# Patient Record
Sex: Female | Born: 1945 | Race: White | Hispanic: No | Smoking: Former smoker
Health system: Southern US, Community
[De-identification: ages and names within clinical notes are randomized; demographics above are authoritative.]

## PROBLEM LIST (undated history)

## (undated) DIAGNOSIS — S82821A Torus fracture of lower end of right fibula, initial encounter for closed fracture: Secondary | ICD-10-CM

## (undated) DIAGNOSIS — K219 Gastro-esophageal reflux disease without esophagitis: Secondary | ICD-10-CM

## (undated) DIAGNOSIS — G4734 Idiopathic sleep related nonobstructive alveolar hypoventilation: Secondary | ICD-10-CM

## (undated) DIAGNOSIS — M069 Rheumatoid arthritis, unspecified: Secondary | ICD-10-CM

## (undated) DIAGNOSIS — S2220XA Unspecified fracture of sternum, initial encounter for closed fracture: Secondary | ICD-10-CM

## (undated) DIAGNOSIS — F329 Major depressive disorder, single episode, unspecified: Secondary | ICD-10-CM

## (undated) DIAGNOSIS — A0472 Enterocolitis due to Clostridium difficile, not specified as recurrent: Secondary | ICD-10-CM

## (undated) DIAGNOSIS — I10 Essential (primary) hypertension: Secondary | ICD-10-CM

## (undated) DIAGNOSIS — R053 Chronic cough: Secondary | ICD-10-CM

## (undated) DIAGNOSIS — R05 Cough: Secondary | ICD-10-CM

## (undated) DIAGNOSIS — C801 Malignant (primary) neoplasm, unspecified: Secondary | ICD-10-CM

## (undated) DIAGNOSIS — J189 Pneumonia, unspecified organism: Secondary | ICD-10-CM

## (undated) DIAGNOSIS — M1710 Unilateral primary osteoarthritis, unspecified knee: Secondary | ICD-10-CM

## (undated) DIAGNOSIS — J849 Interstitial pulmonary disease, unspecified: Secondary | ICD-10-CM

## (undated) DIAGNOSIS — J8410 Pulmonary fibrosis, unspecified: Secondary | ICD-10-CM

## (undated) DIAGNOSIS — K589 Irritable bowel syndrome without diarrhea: Secondary | ICD-10-CM

## (undated) DIAGNOSIS — E559 Vitamin D deficiency, unspecified: Secondary | ICD-10-CM

## (undated) DIAGNOSIS — R011 Cardiac murmur, unspecified: Secondary | ICD-10-CM

## (undated) DIAGNOSIS — I517 Cardiomegaly: Secondary | ICD-10-CM

## (undated) DIAGNOSIS — F32A Depression, unspecified: Secondary | ICD-10-CM

## (undated) DIAGNOSIS — M179 Osteoarthritis of knee, unspecified: Secondary | ICD-10-CM

## (undated) HISTORY — DX: Depression, unspecified: F32.A

## (undated) HISTORY — PX: BREAST CYST ASPIRATION: SHX578

## (undated) HISTORY — PX: ESOPHAGOGASTRODUODENOSCOPY: SHX1529

## (undated) HISTORY — PX: HERNIA REPAIR: SHX51

## (undated) HISTORY — PX: EYE SURGERY: SHX253

## (undated) HISTORY — PX: VAGINAL HYSTERECTOMY: SUR661

## (undated) HISTORY — DX: Major depressive disorder, single episode, unspecified: F32.9

## (undated) HISTORY — PX: APPENDECTOMY: SHX54

## (undated) HISTORY — PX: COLONOSCOPY: SHX174

## (undated) HISTORY — PX: CATARACT EXTRACTION W/ INTRAOCULAR LENS  IMPLANT, BILATERAL: SHX1307

---

## 2003-12-20 ENCOUNTER — Ambulatory Visit: Payer: Self-pay | Admitting: Internal Medicine

## 2004-12-22 ENCOUNTER — Ambulatory Visit: Payer: Self-pay | Admitting: Internal Medicine

## 2005-02-05 ENCOUNTER — Ambulatory Visit: Payer: Self-pay | Admitting: Gastroenterology

## 2005-12-24 ENCOUNTER — Ambulatory Visit: Payer: Self-pay | Admitting: Internal Medicine

## 2006-11-19 ENCOUNTER — Ambulatory Visit: Payer: Self-pay | Admitting: Internal Medicine

## 2007-01-11 ENCOUNTER — Ambulatory Visit: Payer: Self-pay | Admitting: Internal Medicine

## 2008-01-12 ENCOUNTER — Ambulatory Visit: Payer: Self-pay | Admitting: Internal Medicine

## 2009-02-12 ENCOUNTER — Ambulatory Visit: Payer: Self-pay | Admitting: Internal Medicine

## 2010-01-22 ENCOUNTER — Ambulatory Visit: Payer: Self-pay | Admitting: Unknown Physician Specialty

## 2010-03-05 ENCOUNTER — Ambulatory Visit: Payer: Self-pay | Admitting: Ophthalmology

## 2010-03-10 ENCOUNTER — Ambulatory Visit: Payer: Self-pay | Admitting: Internal Medicine

## 2010-03-31 ENCOUNTER — Ambulatory Visit: Payer: Self-pay | Admitting: Ophthalmology

## 2011-04-07 ENCOUNTER — Ambulatory Visit: Payer: Self-pay | Admitting: Internal Medicine

## 2011-04-10 ENCOUNTER — Observation Stay: Payer: Self-pay | Admitting: Surgery

## 2011-04-10 LAB — CBC WITH DIFFERENTIAL/PLATELET
Basophil #: 0 10*3/uL (ref 0.0–0.1)
Basophil %: 0.5 %
Eosinophil %: 4.7 %
HCT: 39.2 % (ref 35.0–47.0)
HGB: 12.9 g/dL (ref 12.0–16.0)
Lymphocyte %: 45.8 %
Monocyte #: 0.4 10*3/uL (ref 0.0–0.7)
Monocyte %: 6.1 %
Neutrophil #: 2.6 10*3/uL (ref 1.4–6.5)
Neutrophil %: 42.9 %
Platelet: 198 10*3/uL (ref 150–440)
RBC: 3.88 10*6/uL (ref 3.80–5.20)
RDW: 15.5 % — ABNORMAL HIGH (ref 11.5–14.5)

## 2011-04-10 LAB — COMPREHENSIVE METABOLIC PANEL
Albumin: 4.1 g/dL (ref 3.4–5.0)
Bilirubin,Total: 0.7 mg/dL (ref 0.2–1.0)
Chloride: 102 mmol/L (ref 98–107)
Co2: 29 mmol/L (ref 21–32)
EGFR (African American): >60
EGFR (Non-African Amer.): >60
Glucose: 88 mg/dL (ref 65–99)
Osmolality: 279 (ref 275–301)
Potassium: 4.3 mmol/L (ref 3.5–5.1)
SGOT(AST): 38 U/L — ABNORMAL HIGH (ref 15–37)
SGPT (ALT): 37 U/L

## 2011-04-10 LAB — TROPONIN I: Troponin-I: <0.02 ng/mL

## 2011-04-11 LAB — BASIC METABOLIC PANEL
Anion Gap: 8 (ref 7–16)
BUN: 10 mg/dL (ref 7–18)
Chloride: 102 mmol/L (ref 98–107)
Co2: 30 mmol/L (ref 21–32)
Creatinine: 0.75 mg/dL (ref 0.60–1.30)
EGFR (African American): >60
EGFR (Non-African Amer.): >60
Osmolality: 279 (ref 275–301)
Potassium: 4.1 mmol/L (ref 3.5–5.1)

## 2011-04-11 LAB — CBC WITH DIFFERENTIAL/PLATELET
Basophil %: 0.1 %
HCT: 38.6 % (ref 35.0–47.0)
MCV: 102 fL — ABNORMAL HIGH (ref 80–100)
Monocyte #: 0.6 10*3/uL (ref 0.0–0.7)
Monocyte %: 5.9 %
Neutrophil #: 7.1 10*3/uL — ABNORMAL HIGH (ref 1.4–6.5)
RDW: 14.3 % (ref 11.5–14.5)

## 2012-02-10 DIAGNOSIS — M0609 Rheumatoid arthritis without rheumatoid factor, multiple sites: Secondary | ICD-10-CM | POA: Insufficient documentation

## 2012-04-07 ENCOUNTER — Ambulatory Visit: Payer: Self-pay | Admitting: Internal Medicine

## 2012-06-19 ENCOUNTER — Inpatient Hospital Stay: Payer: Self-pay | Admitting: Internal Medicine

## 2012-06-19 LAB — COMPREHENSIVE METABOLIC PANEL
Alkaline Phosphatase: 70 U/L (ref 50–136)
Creatinine: 0.86 mg/dL (ref 0.60–1.30)
EGFR (African American): >60
Glucose: 115 mg/dL — ABNORMAL HIGH (ref 65–99)
Potassium: 4.3 mmol/L (ref 3.5–5.1)
SGPT (ALT): 22 U/L (ref 12–78)
Sodium: 144 mmol/L (ref 136–145)

## 2012-06-19 LAB — URINALYSIS, COMPLETE
Bilirubin,UR: NEGATIVE
Blood: NEGATIVE
Glucose,UR: NEGATIVE mg/dL (ref 0–75)
Ph: 5 (ref 4.5–8.0)
Protein: 30
Squamous Epithelial: 6
WBC UR: 46 /HPF (ref 0–5)

## 2012-06-19 LAB — CBC
HGB: 13.1 g/dL (ref 12.0–16.0)
Platelet: 193 10*3/uL (ref 150–440)
RBC: 3.83 10*6/uL (ref 3.80–5.20)
WBC: 15.1 10*3/uL — ABNORMAL HIGH (ref 3.6–11.0)

## 2012-06-19 LAB — LIPASE, BLOOD: Lipase: 59 U/L — ABNORMAL LOW (ref 73–393)

## 2012-06-19 LAB — TROPONIN I: Troponin-I: <0.02 ng/mL

## 2012-06-20 LAB — COMPREHENSIVE METABOLIC PANEL
Alkaline Phosphatase: 58 U/L (ref 50–136)
BUN: 23 mg/dL — ABNORMAL HIGH (ref 7–18)
Bilirubin,Total: 0.8 mg/dL (ref 0.2–1.0)
Co2: 25 mmol/L (ref 21–32)
Creatinine: 0.77 mg/dL (ref 0.60–1.30)
EGFR (Non-African Amer.): >60
Osmolality: 290 (ref 275–301)
Potassium: 3.5 mmol/L (ref 3.5–5.1)
SGOT(AST): 21 U/L (ref 15–37)
Total Protein: 5.4 g/dL — ABNORMAL LOW (ref 6.4–8.2)

## 2012-06-20 LAB — CBC WITH DIFFERENTIAL/PLATELET
Basophil %: 0.1 %
HGB: 11.3 g/dL — ABNORMAL LOW (ref 12.0–16.0)
Lymphocyte #: 0.9 10*3/uL — ABNORMAL LOW (ref 1.0–3.6)
MCH: 35 pg — ABNORMAL HIGH (ref 26.0–34.0)
MCV: 102 fL — ABNORMAL HIGH (ref 80–100)
Monocyte %: 9.9 %
Neutrophil #: 15.6 10*3/uL — ABNORMAL HIGH (ref 1.4–6.5)
Neutrophil %: 85.3 %
Platelet: 165 10*3/uL (ref 150–440)
RDW: 15.6 % — ABNORMAL HIGH (ref 11.5–14.5)
WBC: 18.3 10*3/uL — ABNORMAL HIGH (ref 3.6–11.0)

## 2012-06-21 LAB — COMPREHENSIVE METABOLIC PANEL
Albumin: 2.3 g/dL — ABNORMAL LOW (ref 3.4–5.0)
Bilirubin,Total: 0.8 mg/dL (ref 0.2–1.0)
Osmolality: 276 (ref 275–301)
Potassium: 3.3 mmol/L — ABNORMAL LOW (ref 3.5–5.1)
SGPT (ALT): 17 U/L (ref 12–78)
Sodium: 138 mmol/L (ref 136–145)
Total Protein: 4.9 g/dL — ABNORMAL LOW (ref 6.4–8.2)

## 2012-06-21 LAB — CBC WITH DIFFERENTIAL/PLATELET
HCT: 30.1 % — ABNORMAL LOW (ref 35.0–47.0)
HGB: 10.1 g/dL — ABNORMAL LOW (ref 12.0–16.0)
Lymphocytes: 13 %
MCH: 34.6 pg — ABNORMAL HIGH (ref 26.0–34.0)
MCHC: 33.5 g/dL (ref 32.0–36.0)
MCV: 103 fL — ABNORMAL HIGH (ref 80–100)
Platelet: 137 10*3/uL — ABNORMAL LOW (ref 150–440)
RDW: 15.4 % — ABNORMAL HIGH (ref 11.5–14.5)
WBC: 15.3 10*3/uL — ABNORMAL HIGH (ref 3.6–11.0)

## 2012-06-22 LAB — BASIC METABOLIC PANEL
Anion Gap: 5 — ABNORMAL LOW (ref 7–16)
Chloride: 114 mmol/L — ABNORMAL HIGH (ref 98–107)
Co2: 25 mmol/L (ref 21–32)
EGFR (African American): >60
EGFR (Non-African Amer.): >60
Glucose: 98 mg/dL (ref 65–99)
Osmolality: 285 (ref 275–301)
Potassium: 3.7 mmol/L (ref 3.5–5.1)
Sodium: 144 mmol/L (ref 136–145)

## 2012-06-22 LAB — CBC WITH DIFFERENTIAL/PLATELET
Basophil %: 0.4 %
Eosinophil %: 2.1 %
HGB: 9.6 g/dL — ABNORMAL LOW (ref 12.0–16.0)
Lymphocyte #: 1.9 10*3/uL (ref 1.0–3.6)
Lymphocyte %: 11.6 %
MCH: 34.2 pg — ABNORMAL HIGH (ref 26.0–34.0)
MCHC: 33.4 g/dL (ref 32.0–36.0)
MCV: 102 fL — ABNORMAL HIGH (ref 80–100)
Neutrophil #: 12.7 10*3/uL — ABNORMAL HIGH (ref 1.4–6.5)
Neutrophil %: 76.9 %
Platelet: 154 10*3/uL (ref 150–440)
RDW: 15.2 % — ABNORMAL HIGH (ref 11.5–14.5)

## 2012-06-23 LAB — COMPREHENSIVE METABOLIC PANEL
Albumin: 2.2 g/dL — ABNORMAL LOW (ref 3.4–5.0)
Alkaline Phosphatase: 57 U/L (ref 50–136)
BUN: 4 mg/dL — ABNORMAL LOW (ref 7–18)
Bilirubin,Total: 0.4 mg/dL (ref 0.2–1.0)
Calcium, Total: 7.8 mg/dL — ABNORMAL LOW (ref 8.5–10.1)
Co2: 29 mmol/L (ref 21–32)
EGFR (Non-African Amer.): >60
Glucose: 104 mg/dL — ABNORMAL HIGH (ref 65–99)
Osmolality: 280 (ref 275–301)
Potassium: 3.7 mmol/L (ref 3.5–5.1)
SGOT(AST): 25 U/L (ref 15–37)
SGPT (ALT): 19 U/L (ref 12–78)
Sodium: 142 mmol/L (ref 136–145)

## 2012-06-23 LAB — CBC WITH DIFFERENTIAL/PLATELET
Basophil %: 0.3 %
HCT: 28 % — ABNORMAL LOW (ref 35.0–47.0)
Lymphocyte %: 14.6 %
MCH: 34.4 pg — ABNORMAL HIGH (ref 26.0–34.0)
MCV: 102 fL — ABNORMAL HIGH (ref 80–100)
Monocyte #: 1.4 x10 3/mm — ABNORMAL HIGH (ref 0.2–0.9)
Monocyte %: 11.4 %
Neutrophil #: 8.7 10*3/uL — ABNORMAL HIGH (ref 1.4–6.5)
Platelet: 175 10*3/uL (ref 150–440)
RBC: 2.75 10*6/uL — ABNORMAL LOW (ref 3.80–5.20)
WBC: 12.3 10*3/uL — ABNORMAL HIGH (ref 3.6–11.0)

## 2012-06-23 LAB — STOOL CULTURE

## 2012-06-24 LAB — CBC WITH DIFFERENTIAL/PLATELET
Bands: 8 %
Eosinophil: 3 %
HCT: 29.2 % — ABNORMAL LOW (ref 35.0–47.0)
Lymphocytes: 24 %
MCH: 34.4 pg — ABNORMAL HIGH (ref 26.0–34.0)
MCHC: 34.1 g/dL (ref 32.0–36.0)
Monocytes: 15 %
Myelocyte: 1 %
Platelet: 197 10*3/uL (ref 150–440)
RBC: 2.9 10*6/uL — ABNORMAL LOW (ref 3.80–5.20)
RDW: 15.2 % — ABNORMAL HIGH (ref 11.5–14.5)
WBC: 10.3 10*3/uL (ref 3.6–11.0)

## 2012-06-24 LAB — COMPREHENSIVE METABOLIC PANEL
Albumin: 2.2 g/dL — ABNORMAL LOW (ref 3.4–5.0)
BUN: 2 mg/dL — ABNORMAL LOW (ref 7–18)
Bilirubin,Total: 0.4 mg/dL (ref 0.2–1.0)
Calcium, Total: 7.8 mg/dL — ABNORMAL LOW (ref 8.5–10.1)
Creatinine: 0.63 mg/dL (ref 0.60–1.30)
EGFR (African American): >60
Osmolality: 280 (ref 275–301)
Potassium: 3.3 mmol/L — ABNORMAL LOW (ref 3.5–5.1)
SGOT(AST): 24 U/L (ref 15–37)
Total Protein: 4.7 g/dL — ABNORMAL LOW (ref 6.4–8.2)

## 2012-06-25 LAB — CBC WITH DIFFERENTIAL/PLATELET
HCT: 30.5 % — ABNORMAL LOW (ref 35.0–47.0)
HGB: 10.8 g/dL — ABNORMAL LOW (ref 12.0–16.0)
MCH: 35.3 pg — ABNORMAL HIGH (ref 26.0–34.0)
MCHC: 35.4 g/dL (ref 32.0–36.0)
Metamyelocyte: 1 %
Monocytes: 7 %
Platelet: 242 10*3/uL (ref 150–440)
RBC: 3.05 10*6/uL — ABNORMAL LOW (ref 3.80–5.20)
RDW: 15.1 % — ABNORMAL HIGH (ref 11.5–14.5)
WBC: 10.9 10*3/uL (ref 3.6–11.0)

## 2012-06-25 LAB — COMPREHENSIVE METABOLIC PANEL
Alkaline Phosphatase: 53 U/L (ref 50–136)
Bilirubin,Total: 0.3 mg/dL (ref 0.2–1.0)
Calcium, Total: 8 mg/dL — ABNORMAL LOW (ref 8.5–10.1)
Chloride: 107 mmol/L (ref 98–107)
Co2: 31 mmol/L (ref 21–32)
EGFR (African American): >60
EGFR (Non-African Amer.): >60
Glucose: 111 mg/dL — ABNORMAL HIGH (ref 65–99)
Osmolality: 283 (ref 275–301)
Potassium: 3.4 mmol/L — ABNORMAL LOW (ref 3.5–5.1)
Sodium: 143 mmol/L (ref 136–145)

## 2012-06-25 LAB — MAGNESIUM: Magnesium: 1.7 mg/dL — ABNORMAL LOW

## 2012-10-10 ENCOUNTER — Other Ambulatory Visit: Payer: Self-pay | Admitting: Gastroenterology

## 2012-10-10 LAB — CLOSTRIDIUM DIFFICILE BY PCR

## 2013-04-10 ENCOUNTER — Ambulatory Visit: Payer: Self-pay | Admitting: Internal Medicine

## 2013-08-21 DIAGNOSIS — I517 Cardiomegaly: Secondary | ICD-10-CM | POA: Insufficient documentation

## 2013-08-21 DIAGNOSIS — Z9109 Other allergy status, other than to drugs and biological substances: Secondary | ICD-10-CM | POA: Insufficient documentation

## 2013-08-21 DIAGNOSIS — F32A Depression, unspecified: Secondary | ICD-10-CM | POA: Insufficient documentation

## 2013-08-21 DIAGNOSIS — E559 Vitamin D deficiency, unspecified: Secondary | ICD-10-CM | POA: Insufficient documentation

## 2013-08-21 DIAGNOSIS — M179 Osteoarthritis of knee, unspecified: Secondary | ICD-10-CM | POA: Insufficient documentation

## 2013-08-21 DIAGNOSIS — F329 Major depressive disorder, single episode, unspecified: Secondary | ICD-10-CM | POA: Insufficient documentation

## 2013-08-21 DIAGNOSIS — M1710 Unilateral primary osteoarthritis, unspecified knee: Secondary | ICD-10-CM | POA: Insufficient documentation

## 2013-08-21 DIAGNOSIS — K589 Irritable bowel syndrome without diarrhea: Secondary | ICD-10-CM | POA: Insufficient documentation

## 2013-10-03 DIAGNOSIS — S82821A Torus fracture of lower end of right fibula, initial encounter for closed fracture: Secondary | ICD-10-CM | POA: Insufficient documentation

## 2014-04-27 NOTE — Consult Note (Signed)
Chief Complaint:  Subjective/Chief Complaint some mild nausea today, no emesis, less lower abdominalpain, mile eipgastric and luq pain/discomfort.   VITAL SIGNS/ANCILLARY NOTES: **Vital Signs.:   17-Jun-14 04:10  Vital Signs Type Routine  Temperature Temperature (F) 98.9  Celsius 37.1  Temperature Source oral  Pulse Pulse 99  Respirations Respirations 20  Systolic BP Systolic BP 240  Diastolic BP (mmHg) Diastolic BP (mmHg) 72  Mean BP 82  Pulse Ox % Pulse Ox % 93  Pulse Ox Activity Level  At rest  Oxygen Delivery Room Air/ 21 %   Brief Assessment:  Cardiac Regular   Respiratory clear BS   Gastrointestinal details normal Soft  Nondistended  No masses palpable  Bowel sounds normal  tender to palpation in the epigastrum and luq.   Lab Results: Hepatic:  16-Jun-14 03:35   Bilirubin, Total 0.8  Alkaline Phosphatase 58  SGPT (ALT) 18  SGOT (AST) 21  Total Protein, Serum  5.4  Albumin, Serum  2.8  17-Jun-14 04:14   Bilirubin, Total 0.8  Alkaline Phosphatase 106  SGPT (ALT) 17  SGOT (AST) 26  Total Protein, Serum  4.9  Albumin, Serum  2.3  Routine Chem:  16-Jun-14 03:35   Glucose, Serum  152  BUN  23  Creatinine (comp) 0.77  Sodium, Serum 142  Potassium, Serum 3.5  Chloride, Serum  110  CO2, Serum 25  Calcium (Total), Serum  7.7  Osmolality (calc) 290  eGFR (African American) >60  eGFR (Non-African American) >60 (eGFR values <18m/min/1.73 m2 may be an indication of chronic kidney disease (CKD). Calculated eGFR is useful in patients with stable renal function. The eGFR calculation will not be reliable in acutely ill patients when serum creatinine is changing rapidly. It is not useful in  patients on dialysis. The eGFR calculation may not be applicable to patients at the low and high extremes of body sizes, pregnant women, and vegetarians.)  Anion Gap 7  17-Jun-14 04:14   BUN 13  Creatinine (comp) 0.71  Sodium, Serum 138  Potassium, Serum  3.3  Chloride,  Serum  109  CO2, Serum 21  Calcium (Total), Serum  7.8  Osmolality (calc) 276  eGFR (African American) >60  eGFR (Non-African American) >60 (eGFR values <676mmin/1.73 m2 may be an indication of chronic kidney disease (CKD). Calculated eGFR is useful in patients with stable renal function. The eGFR calculation will not be reliable in acutely ill patients when serum creatinine is changing rapidly. It is not useful in  patients on dialysis. The eGFR calculation may not be applicable to patients at the low and high extremes of body sizes, pregnant women, and vegetarians.)  Anion Gap 8  Routine Hem:  16-Jun-14 03:35   WBC (CBC)  18.3  RBC (CBC)  3.22  Hemoglobin (CBC)  11.3  Hematocrit (CBC)  32.9  Platelet Count (CBC) 165  MCV  102  MCH  35.0  MCHC 34.2  RDW  15.6  Neutrophil % 85.3  Lymphocyte % 4.7  Monocyte % 9.9  Eosinophil % 0.0  Basophil % 0.1  Neutrophil #  15.6  Lymphocyte #  0.9  Monocyte #  1.8  Eosinophil # 0.0  Basophil # 0.0 (Result(s) reported on 20 Jun 2012 at 05:40AM.)  17-Jun-14 04:14   WBC (CBC)  15.3  RBC (CBC)  2.91  Hemoglobin (CBC)  10.1  Hematocrit (CBC)  30.1  Platelet Count (CBC)  137 (Result(s) reported on 21 Jun 2012 at 07:20AM.)  MCV  103  [facility]  34.[facility]  MCHC 33.5  RDW  15.4  Bands 2  Segmented Neutrophils 78  Lymphocytes 13  Monocytes 7  Diff Comment 1 ANISOCYTOSIS  Diff Comment 2 PLTS VARIED IN SIZE  Result(s) reported on 21 Jun 2012 at 07:20AM.   Radiology Results: XRay:    16-Jun-14 09:23, KUB - Kidney Ureter Bladder  KUB - Kidney Ureter Bladder   REASON FOR EXAM:    abd. pain  COMMENTS:   Bedside (portable):Y    PROCEDURE: DXR - DXR KIDNEY URETER BLADDER  - Jun 20 2012  9:23AM     RESULT:     Findings: Air is seen within nondilated loops of large and small bowel. A   small to moderate amount of stool is appreciated within the colon. Mild   S-shaped scoliosis is identified within the thoracolumbar spine.   Degenerative change  is identified within the lower lumbar spine. There is   no evidence of acute fracture or dislocation.    IMPRESSION:    1. Nonobstructive bowel gas pattern with a moderate amount of stool.    Thank you for the opportunity to contribute to the care of your patient.         Verified By: Mikki Santee, M.D., MD   Assessment/Plan:  Assessment/Plan:  Assessment 1) nausea vomiting , possible hematemesis, epigstric pain-some improvement.  symptoms were relatively acute onset.   2) abnormal CT abdomen with evidence of possible colitis, doubt imflammatory, likely infective.  Lower abdominalpain much improved today. .  formed bm last night.   Plan 1) EGD today.  I have discussed the risks benefits and complications of egd to include not limited to bleeding infection perforation and sedation and she wishes to proceed.  Further recs to follow.   Electronic Signatures: Loistine Simas (MD)  (Signed 17-Jun-14 12:35)  Authored: Chief Complaint, VITAL SIGNS/ANCILLARY NOTES, Brief Assessment, Lab Results, Radiology Results, Assessment/Plan   Last Updated: 17-Jun-14 12:35 by Loistine Simas (MD)

## 2014-04-27 NOTE — Consult Note (Signed)
Chief Complaint:  Subjective/Chief Complaint feeling some better today, continues to improve.  minimal nausea, no emesis, mild intermittant lower abd crampiness.  several bm today after contrast for ct.   VITAL SIGNS/ANCILLARY NOTES: **Vital Signs.:   19-Jun-14 13:36  Vital Signs Type Routine  Temperature Temperature (F) 99.2  Celsius 37.3  Temperature Source AdultAxillary  Pulse Pulse 73  Respirations Respirations 20  Systolic BP Systolic BP 983  Diastolic BP (mmHg) Diastolic BP (mmHg) 80  Mean BP 95  Pulse Ox % Pulse Ox % 95  Pulse Ox Activity Level  At rest  Oxygen Delivery Room Air/ 21 %  *Intake and Output.:   19-Jun-14 03:40  Stool  Small loose stool.   Brief Assessment:  Cardiac Regular   Respiratory clear BS   Gastrointestinal details normal Soft  Nontender  Nondistended  Bowel sounds normal  No rebound tenderness   Lab Results: Hepatic:  19-Jun-14 06:22   Bilirubin, Total 0.4  Alkaline Phosphatase 57  SGPT (ALT) 19  SGOT (AST) 25  Total Protein, Serum  4.7  Albumin, Serum  2.2  Routine Micro:  17-Jun-14 21:10   Comment  1. POSITIVE-CLOS.DIFFICILE TOXIN DETECTED BY PCR ---------------------------------- Test procedure integrates sample purification, nucleic acid amplification, and detection of the target Clostridium difficile sequence in simple or complex samples using real-time PCR and RT-PCR assays.  Routine Chem:  17-Jun-14 04:14   CO2, Serum 21  19-Jun-14 06:22   Glucose, Serum  104  BUN  4  Creatinine (comp)  0.55  Sodium, Serum 142  Potassium, Serum 3.7  Chloride, Serum  110  CO2, Serum 29  Calcium (Total), Serum  7.8  Osmolality (calc) 280  eGFR (African American) >60  eGFR (Non-African American) >60 (eGFR values <57m/min/1.73 m2 may be an indication of chronic kidney disease (CKD). Calculated eGFR is useful in patients with stable renal function. The eGFR calculation will not be reliable in acutely ill patients when serum creatinine is  changing rapidly. It is not useful in  patients on dialysis. The eGFR calculation may not be applicable to patients at the low and high extremes of body sizes, pregnant women, and vegetarians.)  Result Comment POTASSIUM/AST - Slight hemolysis, interpret results with  - caution...tpl  Result(s) reported on 23 Jun 2012 at 07:01AM.  Anion Gap  3  Routine Hem:  19-Jun-14 06:22   WBC (CBC)  12.3  RBC (CBC)  2.75  Hemoglobin (CBC)  9.5  Hematocrit (CBC)  28.0  Platelet Count (CBC) 175  MCV  102  MCH  34.4  MCHC 33.7  RDW  15.3  Neutrophil % 70.8  Lymphocyte % 14.6  Monocyte % 11.4  Eosinophil % 2.9  Basophil % 0.3  Neutrophil #  8.7  Lymphocyte # 1.8  Monocyte #  1.4  Eosinophil # 0.4  Basophil # 0.0 (Result(s) reported on 23 Jun 2012 at 07:01AM.)   Radiology Results: CT:    19-Jun-14 08:14, CT Abdomen and Pelvis Without Contrast  CT Abdomen and Pelvis Without Contrast   REASON FOR EXAM:    (1) ABD. PAIN; (2) PELVIC PAIN  COMMENTS:       PROCEDURE: CT  - CT ABDOMEN AND PELVIS W0  - Jun 23 2012  8:14AM     RESULT: History: Gastritis.    Comparison Study: CT abdomen 06/19/2012    Findings: Standard CT obtained without contrast. Evaluation  in 3   dimensions on separate workstation performed. Small bilateral pleural   effusions. Mild atelectasis lung bases. No  free air. Liver normal. Spleen   normal. Sludge in the gallbladder cannot be excluded. Gallbladder   ultrasound can be obtained if needed. No biliary distention. Pancreas is   normal. Adrenals are normal. The kidneys are normal. No evidence of     ureteral obstruction or hydronephrosis. Bladder is normal. Hysterectomy.   No adnexal mass. Small amount of fluid noted in the cul-de-sac. Prior   appendectomy. Previously identified colonic wall thickening has subsided   with minimal residual in the left colon. These findings are consistent   with improving colitis. Diverticulosis noted. There is no evidence of   bowel  obstruction. Esophago- gastric and gastroduodenal region is normal.   Abdominal aorta normal in caliber. No significant adenopathy. Diffuse   anasarca. Prominent degenerative change thoracolumbar lumbosacral spine.    IMPRESSION:  Interim improvement of colitis with mild residual. Mild   ascites and anasarca. Small bilateral pleural effusions.        Verified By: Osa Craver, M.D., MD   Assessment/Plan:  Assessment/Plan:  Assessment 1) nausea/vomiting abdominal pain.  all improving, mild crampy lower abd discomfort.  Positive C. diff-ct today improved.   Plan 1) please see note from yesterday for GI recs as to abx and probiotic. Will need GI fu in about 3 weeks.   Will order low residue diet for am, continue for 5 days as o/p.   Electronic Signatures: Loistine Simas (MD)  (Signed 19-Jun-14 14:42)  Authored: Chief Complaint, VITAL SIGNS/ANCILLARY NOTES, Brief Assessment, Lab Results, Radiology Results, Assessment/Plan   Last Updated: 19-Jun-14 14:42 by Loistine Simas (MD)

## 2014-04-27 NOTE — Discharge Summary (Signed)
PATIENT NAME:  Stephanie Patel, Stephanie Patel MR#:  [id number] DATE OF BIRTH:  [birth year]  DATE OF ADMISSION:  06/19/2012 DATE OF DISCHARGE:  06/25/2012   DISCHARGE DIAGNOSES:  1. Clostridium difficile colitis.  2. Dehydration.   DISCHARGE MEDICATIONS: Per [facility] medication reconciliation system, will include her home medications except for methotrexate, and she will take Flagyl 500 t.i.d. for 2 weeks.   HISTORY AND PHYSICAL: Please see detailed history and physical done on admission.   HOSPITAL COURSE: The patient was admitted, slowly responded to metronidazole. She was consulted by GI. Dr. Doy Hutching followed her throughout the hospitalization until today's date. He went out of town yesterday, and I took over. She was doing well and wished to go home. Dr. Doy Hutching thought she was ready yesterday or today as well. She was followed by Dr. Gustavo Lah throughout the hospitalization as well, and he thought she was much improved on yesterday's date as well. Labs today showed a relatively normal MET-C. CBC with minimal anemia, likely secondary to her acute illness. Again, will have her hold her methotrexate until she is clearly improved. CT scan of the abdomen on June 19th showed improvement of the colitis compared to earlier CT scan. Her leukocytosis she had on admission improved to normal as well. She knows to call me or Dr. Gustavo Lah until return of Dr. Doy Hutching if she is having further difficulty.   TIME SPENT: It took approximately 35 minutes to review all of her hospitalization, write prescriptions and do her discharge tasks today.  ____________________________ Ocie Cornfield. Ouida Sills, MD mwa:OSi D: 06/25/2012 11:12:00 ET T: 06/25/2012 11:19:57 ET JOB#: [id number]  cc: Ocie Cornfield. Ouida Sills, MD, <Dictator> Kirk Ruths MD ELECTRONICALLY SIGNED 06/26/2012 9:26
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
## 2014-04-27 NOTE — Consult Note (Signed)
Brief Consult Note: Diagnosis: colitis.   Patient was seen by consultant.   Consult note dictated.   Orders entered.   Discussed with Attending MD.   Comments: Patient seen and evaluated. Initially came in with n/v, possible coffee-ground emesis. History of daily NSAID use (naproxyn). Also with complaints of LLQ pain, CT noting descending colitis. Pt has been started on cipro and flagyl, still in pain but slowly starting to feel improvement. Denies diarrhea,constipation, melena, BRBPR. Elevated WBC. Vomiting resolved, still mildly nauseated. tolerating liquid diet. Recc continuing cipro/flagyl, PPI ggt, will check stool studies for comp culture and cdiff. Also, will keep patient NPO after MN  for possible EGD due to recent emesis, NSAID use, and slight decline in hgb. Full consult being dictated. Will follow.  Electronic Signatures: Sherald Barge (PA-C)  (Signed 16-Jun-14 13:40)  Authored: Brief Consult Note   Last Updated: 16-Jun-14 13:40 by Sherald Barge (PA-C)

## 2014-04-27 NOTE — Consult Note (Signed)
Chief Complaint:  Subjective/Chief Complaint doing well, currently no n/v, some abdominal pain earlier today, none this afternoon.  one bm today, some loose   VITAL SIGNS/ANCILLARY NOTES: **Vital Signs.:   20-Jun-14 15:14  Vital Signs Type Routine  Temperature Temperature (F) 98.5  Celsius 36.9  Temperature Source oral  Pulse Pulse 90  Respirations Respirations 20  Systolic BP Systolic BP 681  Diastolic BP (mmHg) Diastolic BP (mmHg) 87  Mean BP 100  Pulse Ox % Pulse Ox % 95  Pulse Ox Activity Level  At rest  Oxygen Delivery Room Air/ 21 %  *Intake and Output.:   20-Jun-14 07:30  Stool  1 loose stool per patient   Brief Assessment:  Cardiac Regular   Respiratory clear BS   Gastrointestinal details normal Soft  Nontender  Nondistended  No masses palpable  Bowel sounds normal   Lab Results: Hepatic:  20-Jun-14 06:22   Bilirubin, Total 0.4  Alkaline Phosphatase  47  SGPT (ALT) 18  SGOT (AST) 24  Total Protein, Serum  4.7  Albumin, Serum  2.2  Lab:  20-Jun-14 04:40   O2 Saturation (Pulse Ox) 92  FiO2 (Pulse Ox) 0.21 (Result(s) reported on 24 Jun 2012 at 05:12AM.)  Routine Chem:  20-Jun-14 06:22   Glucose, Serum  105  BUN  2  Creatinine (comp) 0.63  Sodium, Serum 142  Potassium, Serum  3.3  Chloride, Serum  108  CO2, Serum 31  Calcium (Total), Serum  7.8  Osmolality (calc) 280  eGFR (African American) >60  eGFR (Non-African American) >60 (eGFR values <9m/min/1.73 m2 may be an indication of chronic kidney disease (CKD). Calculated eGFR is useful in patients with stable renal function. The eGFR calculation will not be reliable in acutely ill patients when serum creatinine is changing rapidly. It is not useful in  patients on dialysis. The eGFR calculation may not be applicable to patients at the low and high extremes of body sizes, pregnant women, and vegetarians.)  Anion Gap  3  Routine Hem:  20-Jun-14 06:22   WBC (CBC) 10.3  RBC (CBC)  2.90  Hemoglobin  (CBC)  10.0  Hematocrit (CBC)  29.2  Platelet Count (CBC) 197 (Result(s) reported on 24 Jun 2012 at 08:31AM.)  MCV  101  MCH  34.4  MCHC 34.1  RDW  15.2  Bands 8  Segmented Neutrophils 49  Lymphocytes 24  Monocytes 15  Eosinophil 3  Myelocyte 1  Diff Comment 1 ANISOCYTOSIS  Diff Comment 2 HYPOCHROMIA  Diff Comment 3 PLTS VARIED IN SIZE  Diff Comment 4 LARGE PLATELETS  Result(s) reported on 24 Jun 2012 at 08:31AM.   Assessment/Plan:  Assessment/Plan:  Assessment 1) abdominal pain, n/v-resolved. tolerating po, no abdominal pain,  positive c diff.   Plan 1) finish 10 day course of flagyl, add FLORASTOR probiotic 500 mg po bid, continue 30 days past end of antibiotic.  GI o/p follow up in about 2 weeks,  signing off. reconsult if needed.   Electronic Signatures: SLoistine Simas(MD)  (Signed 20-Jun-14 19:58)  Authored: Chief Complaint, VITAL SIGNS/ANCILLARY NOTES, Brief Assessment, Lab Results, Assessment/Plan   Last Updated: 20-Jun-14 19:58 by SLoistine Simas(MD)

## 2014-04-27 NOTE — Consult Note (Signed)
Chief Complaint:  Subjective/Chief Complaint Patient seen and examined, chart reviewed.  Please see full GI consult and brief consult note.  Plans for EGD tomorrow.  I have discussed the risks benefits and complications of proceedure to include not limited to bleeding infection perforation and sedation and she wishes to proceed.  Further recs to follow, continue ppi.   VITAL SIGNS/ANCILLARY NOTES: **Vital Signs.:   16-Jun-14 14:31  Temperature Temperature (F) 98  Celsius 36.6  Temperature Source oral  Pulse Pulse 92  Respirations Respirations 20  Systolic BP Systolic BP 102  Diastolic BP (mmHg) Diastolic BP (mmHg) 71  Mean BP 82  Pulse Ox % Pulse Ox % 90  Pulse Ox Activity Level  At rest  Oxygen Delivery Room Air/ 21 %   Electronic Signatures: Loistine Simas (MD)  (Signed 16-Jun-14 23:03)  Authored: Chief Complaint, VITAL SIGNS/ANCILLARY NOTES   Last Updated: 16-Jun-14 23:03 by Loistine Simas (MD)

## 2014-04-27 NOTE — Consult Note (Signed)
Chief Complaint:  Subjective/Chief Complaint patient feeling better, minimal abdominal discomfort, minimal intermittant nausea, no emesis.  bm loose, no blood, not watery as seen.   VITAL SIGNS/ANCILLARY NOTES: **Vital Signs.:   18-Jun-14 13:37  Vital Signs Type Routine  Temperature Temperature (F) 98.6  Celsius 37  Temperature Source oral  Pulse Pulse 64  Respirations Respirations 20  Systolic BP Systolic BP 683  Diastolic BP (mmHg) Diastolic BP (mmHg) 73  Mean BP 86  Pulse Ox % Pulse Ox % 90  Pulse Ox Activity Level  At rest  Oxygen Delivery Room Air/ 21 %  *Intake and Output.:   18-Jun-14 00:48  Stool  small loose stool    07:02  Stool  Patient had a small loose bowel movement.    07:30  Stool  pt had a small loose bm    15:27  Stool  pt had a small loose bm    17:41  Stool  Patient had a small loose stool.   Brief Assessment:  Cardiac Regular   Respiratory clear BS   Gastrointestinal details normal Soft  Nontender  Nondistended  No masses palpable  Bowel sounds normal   Lab Results: Routine Micro:  17-Jun-14 03:32   Culture Comment    . NO CAMPYLOBACTER ANTIGEN DETECTED  Result(s) reported on 22 Jun 2012 at 10:36AM.  Routine Chem:  17-Jun-14 21:10   Result Comment C DIFF POSITIVE ANTIGEN - SKY TO BRANDY DAVENPORT @ 4196 ON  - 06-22-12  - NOTIFIED OF CRITICAL VALUE  - READ-BACK PROCESS PERFORMED.  Result(s) reported on 22 Jun 2012 at 09:51AM.   Assessment/Plan:  Assessment/Plan:  Assessment 1) nausea, vomiting abdominal pain-all resolving.  EGD showing only mild erosive esophagitis, likely from emesis 2) colitis by ct-c/w c. diff.  C. diff positive.-symptoms improving. 3) + UTI on admission   Plan 1) currently on cipro and flagyl.  Continue current for another day, then d/c cipro, continue flagyl 500 mg po tid for 10 days, taper one dose every 3 days until off.  Add FLORASTOR probiotic in another day or two, to be continued for a month after abx finished,  500 mg po bid.  No plans for colonoscopy at this point, will need GI fu as outpatient.  following.   Electronic Signatures: Loistine Simas (MD)  (Signed 18-Jun-14 18:22)  Authored: Chief Complaint, VITAL SIGNS/ANCILLARY NOTES, Brief Assessment, Lab Results, Assessment/Plan   Last Updated: 18-Jun-14 18:22 by Loistine Simas (MD)

## 2014-04-27 NOTE — Consult Note (Signed)
PATIENT NAME:  Stephanie Patel, Stephanie Patel MR#:  [id number] DATE OF BIRTH:  1945/05/09  DATE OF CONSULTATION:  06/20/2012  REFERRING PHYSICIAN:  Leonie Douglas. Doy Hutching, MD CONSULTING PHYSICIAN:  Corky Sox. Zettie Pho, PA-C ATTENDING GASTROENTEROLOGIST: Lollie Sails, MD  REASON FOR CONSULTATION: Colitis.   HISTORY OF PRESENT ILLNESS: This is a pleasant 69 year old female who initially presented to the Emergency Department with concerns of nausea and vomiting. There is a question of coffee-ground emesis as well. The patient was not sure if what she was actually vomiting was coffee that she drank earlier or not. Her hemoglobin upon initial presentation was 13.1, and it has trended down to 11.3. Upon further work-up, she did mention that she has been having some left lower quadrant abdominal pain. As a result, a CT scan was obtained without contrast showing mild descending colon wall thickening, likely reflecting colitis. No evidence of obstruction or perforation. Therefore, she was initiated on Cipro and Flagyl as well as put on a PPI drip. She does report frequent belching and burping but states that since she has been admitted, there has been no further emesis. There is mild nausea. She has an elevated white blood cell count of 18.3, which has come up since yesterday. Abdominal pain has more or less been unchanged in severity since admission. However, the vomiting has entirely resolved. The patient states that she sees Dr. Vira Agar as an outpatient and does get colonoscopies every 5 years due to a history of colon cancer in the family. Of note, this past Saturday, she did go out to dinner at a restaurant and had shrimp and does feel that the symptoms began shortly after this. There are no sick contacts or recent travel. No fever or chills. No chest pain or shortness of breath. No black or bloody stools that she has noticed.   ALLERGIES: KEFLEX AND CODEINE.   PAST MEDICAL HISTORY: Rheumatoid arthritis, depression.   PAST  SURGICAL HISTORY: Cataract surgery, appendectomy, partial hysterectomy.   HOME MEDICATIONS: Calcium carbonate, Enbrel, multivitamin, naproxen 500 mg p.o. twice daily, Wellbutrin, Zyrtec, methotrexate.   FAMILY HISTORY: There is colon cancer in a second-degree relative on her father's side. No other known family history of GI malignancy, colon polyps or IBD.   SOCIAL HISTORY: The patient does report occasional social alcohol use, having  approximately 1 to 2 drinks of wine per week. No tobacco use. No illicit drug use.   REVIEW OF SYSTEMS: A 10-system review of systems was obtained on the patient. Pertinent positives are mentioned above and otherwise negative.   OBJECTIVE: VITAL SIGNS: Blood pressure 115/75, heart rate 90, respirations 20, temperature 98.8, bedside pulse oximetry 90%.  GENERAL: This is a pleasant 69 year old female resting quietly and comfortably in the exam room, in no acute distress. Alert and oriented x 3.  HEAD: Atraumatic, normocephalic.  NECK: Supple. No lymphadenopathy noted.  HEENT: Sclerae anicteric. Mucous membranes moist.  LUNGS: Respirations are even and unlabored, clear to auscultation bilateral anterior lung fields.  HEART: Regular rate and rhythm. S1, S2 noted.  ABDOMEN: Soft, nontender, nondistended. Normoactive bowel sounds noted in all 4 quadrants. No masses palpated. No guarding or rebound.  EXTREMITIES: Negative for lower extremity edema, 2+ pulses noted bilaterally.  RECTAL: Deferred.  PSYCHIATRIC: Appropriate mood and affect.   LABORATORY DATA: White blood cells 18.3, hemoglobin 11.3, hematocrit 32.9, platelets 165. LFTs within normal limits, except for albumin 2.8. Lactic acid 2.5. Sodium 142, potassium 3.5, BUN 23, creatinine 0.77, glucose 152. Lipase 59. Troponins were negative.  IMAGING: A KUB was obtained on the patient showing a moderate amount of stool within the colon, nonobstructive bowel gas pattern.   CT of the abdomen and pelvis without  contrast was obtained showing mild descending colon wall thickening, likely reflecting colitis. No evidence of obstruction or perforation.   ASSESSMENT: 1.  Left lower quadrant abdominal pain.  2.  Abnormal CT scan showing descending colon wall thickening, likely reflecting colitis.  3.  Leukocytosis.  4.  Nausea and vomiting prior to admission. This has resolved since being admitted. However, she does have a history of daily nonsteroidal antiinflammatory drug use, naproxen twice daily, for rheumatoid arthritis.   PLAN: I have discussed this patient's case in detail with Dr. Loistine Simas, who is involved in the development of the patient's plan of care. At this time, because of the question of colitis on CT scan, we would like to obtain a stool sample to check for a comprehensive culture, Campylobacter as well as C. difficile colitis. We do agree with her being maintained on Cipro and Flagyl, as well as a PPI drip due to her significant NSAID use and recent history of nausea and vomiting. This could be acute enteritis/gastroenteritis. We do recommend, however, keeping her n.p.o. after midnight for a possible EGD to be done tomorrow to evaluate possible gastritis versus peptic ulcer disease from significant NSAID use. Continue to monitor her hemoglobin on a daily basis. Continue symptomatic management with pain control and antiemetics. We will continue to monitor this patient and make further recommendations pending the stool studies and per clinical course.   Thank you so much for this consultation and for allowing [place] to participate in the patient's plan of care.    ____________________________ Corky Sox. Esta Carmon, PA-C kme:jm D: 06/20/2012 14:05:58 ET T: 06/20/2012 15:07:24 ET JOB#: [id number]  cc: Corky Sox. Gessica Jawad, PA-C, <Dictator> [place] PA ELECTRONICALLY SIGNED 06/21/2012 13:57
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
## 2014-05-01 ENCOUNTER — Ambulatory Visit
Admit: 2014-05-01 | Disposition: A | Discharge status: Home / Self Care | Payer: Self-pay | Attending: Obstetrics and Gynecology | Admitting: Obstetrics and Gynecology

## 2014-07-01 ENCOUNTER — Emergency Department: Payer: Medicare Other

## 2014-07-01 ENCOUNTER — Encounter: Payer: Self-pay | Admitting: Emergency Medicine

## 2014-07-01 ENCOUNTER — Emergency Department
Admission: EM | Admit: 2014-07-01 | Discharge: 2014-07-01 | Disposition: A | Discharge status: Home / Self Care | Payer: Medicare Other | Attending: Emergency Medicine | Admitting: Emergency Medicine

## 2014-07-01 DIAGNOSIS — Z87891 Personal history of nicotine dependence: Secondary | ICD-10-CM | POA: Diagnosis not present

## 2014-07-01 DIAGNOSIS — J209 Acute bronchitis, unspecified: Secondary | ICD-10-CM | POA: Diagnosis not present

## 2014-07-01 DIAGNOSIS — J40 Bronchitis, not specified as acute or chronic: Secondary | ICD-10-CM

## 2014-07-01 DIAGNOSIS — R05 Cough: Secondary | ICD-10-CM | POA: Diagnosis present

## 2014-07-01 HISTORY — DX: Rheumatoid arthritis, unspecified: M06.9

## 2014-07-01 MED ORDER — AZITHROMYCIN 250 MG PO TABS: ORAL_TABLET | ORAL | Status: DC

## 2014-07-01 MED ORDER — BENZONATATE 100 MG PO CAPS: 100.0000 mg | ORAL_CAPSULE | Freq: Three times a day (TID) | ORAL | Status: DC | PRN

## 2014-07-01 NOTE — ED Notes (Signed)
Patient transported to X-ray 
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
## 2014-07-01 NOTE — ED Notes (Signed)
Pt states nasal drainage and cough for one week.

## 2014-07-01 NOTE — Discharge Instructions (Signed)
Take medication as prescribed. Eat and drink regularly. Rest.   Follow up with your primary care physician this week.   Return to ER for new or worsening concerns.

## 2015-03-18 ENCOUNTER — Emergency Department: Payer: Medicare Other

## 2015-03-18 ENCOUNTER — Encounter: Payer: Self-pay | Admitting: Emergency Medicine

## 2015-03-18 ENCOUNTER — Inpatient Hospital Stay: Payer: Medicare Other

## 2015-03-18 ENCOUNTER — Inpatient Hospital Stay
Admission: EM | Admit: 2015-03-18 | Discharge: 2015-03-25 | DRG: 193 | Disposition: A | Discharge status: Home / Self Care | Payer: Medicare Other | Attending: Internal Medicine | Admitting: Internal Medicine

## 2015-03-18 DIAGNOSIS — Z888 Allergy status to other drugs, medicaments and biological substances status: Secondary | ICD-10-CM | POA: Diagnosis not present

## 2015-03-18 DIAGNOSIS — J209 Acute bronchitis, unspecified: Secondary | ICD-10-CM | POA: Diagnosis present

## 2015-03-18 DIAGNOSIS — Z820 Family history of epilepsy and other diseases of the nervous system: Secondary | ICD-10-CM | POA: Diagnosis not present

## 2015-03-18 DIAGNOSIS — K589 Irritable bowel syndrome without diarrhea: Secondary | ICD-10-CM | POA: Diagnosis present

## 2015-03-18 DIAGNOSIS — Z87891 Personal history of nicotine dependence: Secondary | ICD-10-CM | POA: Diagnosis not present

## 2015-03-18 DIAGNOSIS — M05741 Rheumatoid arthritis with rheumatoid factor of right hand without organ or systems involvement: Secondary | ICD-10-CM | POA: Diagnosis not present

## 2015-03-18 DIAGNOSIS — Z79899 Other long term (current) drug therapy: Secondary | ICD-10-CM

## 2015-03-18 DIAGNOSIS — F329 Major depressive disorder, single episode, unspecified: Secondary | ICD-10-CM | POA: Diagnosis present

## 2015-03-18 DIAGNOSIS — E876 Hypokalemia: Secondary | ICD-10-CM | POA: Diagnosis present

## 2015-03-18 DIAGNOSIS — A047 Enterocolitis due to Clostridium difficile: Secondary | ICD-10-CM | POA: Diagnosis present

## 2015-03-18 DIAGNOSIS — Z90710 Acquired absence of both cervix and uterus: Secondary | ICD-10-CM

## 2015-03-18 DIAGNOSIS — M069 Rheumatoid arthritis, unspecified: Secondary | ICD-10-CM | POA: Diagnosis present

## 2015-03-18 DIAGNOSIS — Z8249 Family history of ischemic heart disease and other diseases of the circulatory system: Secondary | ICD-10-CM | POA: Diagnosis not present

## 2015-03-18 DIAGNOSIS — R0902 Hypoxemia: Secondary | ICD-10-CM

## 2015-03-18 DIAGNOSIS — J189 Pneumonia, unspecified organism: Principal | ICD-10-CM | POA: Diagnosis present

## 2015-03-18 DIAGNOSIS — J9601 Acute respiratory failure with hypoxia: Secondary | ICD-10-CM | POA: Diagnosis present

## 2015-03-18 DIAGNOSIS — R9389 Abnormal findings on diagnostic imaging of other specified body structures: Secondary | ICD-10-CM

## 2015-03-18 DIAGNOSIS — Z9049 Acquired absence of other specified parts of digestive tract: Secondary | ICD-10-CM | POA: Diagnosis not present

## 2015-03-18 DIAGNOSIS — Z8619 Personal history of other infectious and parasitic diseases: Secondary | ICD-10-CM | POA: Diagnosis not present

## 2015-03-18 DIAGNOSIS — M05742 Rheumatoid arthritis with rheumatoid factor of left hand without organ or systems involvement: Secondary | ICD-10-CM | POA: Diagnosis not present

## 2015-03-18 DIAGNOSIS — K219 Gastro-esophageal reflux disease without esophagitis: Secondary | ICD-10-CM | POA: Diagnosis present

## 2015-03-18 DIAGNOSIS — J8410 Pulmonary fibrosis, unspecified: Secondary | ICD-10-CM | POA: Diagnosis present

## 2015-03-18 HISTORY — DX: Cardiomegaly: I51.7

## 2015-03-18 HISTORY — DX: Irritable bowel syndrome, unspecified: K58.9

## 2015-03-18 LAB — COMPREHENSIVE METABOLIC PANEL
ALBUMIN: 3.1 g/dL — AB (ref 3.5–5.0)
ALK PHOS: 75 U/L (ref 38–126)
ALT: 13 U/L — AB (ref 14–54)
AST: 29 U/L (ref 15–41)
Anion gap: 8 (ref 5–15)
BILIRUBIN TOTAL: 0.4 mg/dL (ref 0.3–1.2)
BUN: 18 mg/dL (ref 6–20)
CALCIUM: 9.1 mg/dL (ref 8.9–10.3)
CO2: 27 mmol/L (ref 22–32)
CREATININE: 0.75 mg/dL (ref 0.44–1.00)
Chloride: 101 mmol/L (ref 101–111)
GFR calc Af Amer: >60 mL/min (ref >60)
GFR calc non Af Amer: >60 mL/min (ref >60)
GLUCOSE: 97 mg/dL (ref 65–99)
POTASSIUM: 4 mmol/L (ref 3.5–5.1)
Sodium: 136 mmol/L (ref 135–145)
TOTAL PROTEIN: 6.4 g/dL — AB (ref 6.5–8.1)

## 2015-03-18 LAB — CBC WITH DIFFERENTIAL/PLATELET
BASOS ABS: 0 10*3/uL (ref 0–0.1)
Basophils Relative: 0 %
Eosinophils Absolute: 0.2 10*3/uL (ref 0–0.7)
Eosinophils Relative: 2 %
HEMATOCRIT: 36.8 % (ref 35.0–47.0)
Hemoglobin: 12.4 g/dL (ref 12.0–16.0)
LYMPHS PCT: 8 %
Lymphs Abs: 0.9 10*3/uL — ABNORMAL LOW (ref 1.0–3.6)
MCH: 33.6 pg (ref 26.0–34.0)
MCHC: 33.8 g/dL (ref 32.0–36.0)
MCV: 99.5 fL (ref 80.0–100.0)
MONO ABS: 1.7 10*3/uL — AB (ref 0.2–0.9)
Monocytes Relative: 14 %
NEUTROS ABS: 9 10*3/uL — AB (ref 1.4–6.5)
Neutrophils Relative %: 76 %
Platelets: 314 10*3/uL (ref 150–440)
RBC: 3.7 MIL/uL — ABNORMAL LOW (ref 3.80–5.20)
RDW: 15.1 % — AB (ref 11.5–14.5)
WBC: 11.9 10*3/uL — ABNORMAL HIGH (ref 3.6–11.0)

## 2015-03-18 LAB — BRAIN NATRIURETIC PEPTIDE: B Natriuretic Peptide: 133 pg/mL — ABNORMAL HIGH (ref 0.0–100.0)

## 2015-03-18 LAB — TROPONIN I: Troponin I: 0.04 ng/mL — ABNORMAL HIGH (ref <0.031)

## 2015-03-18 LAB — LACTIC ACID, PLASMA
LACTIC ACID, VENOUS: 1.4 mmol/L (ref 0.5–2.0)
Lactic Acid, Venous: 1.5 mmol/L (ref 0.5–2.0)

## 2015-03-18 MED ORDER — LORATADINE 10 MG PO TABS
10.0000 mg | ORAL_TABLET | Freq: Every day | ORAL | Status: DC
  Administered 2015-03-18 – 2015-03-25 (×8): 10 mg via ORAL
  Filled 2015-03-18 (×8): qty 1

## 2015-03-18 MED ORDER — VALACYCLOVIR HCL 500 MG PO TABS
500.0000 mg | ORAL_TABLET | ORAL | Status: DC
  Administered 2015-03-20 – 2015-03-25 (×3): 500 mg via ORAL
  Filled 2015-03-18 (×3): qty 1

## 2015-03-18 MED ORDER — CETYLPYRIDINIUM CHLORIDE 0.05 % MT LIQD
7.0000 mL | Freq: Two times a day (BID) | OROMUCOSAL | Status: DC
  Administered 2015-03-18 – 2015-03-25 (×8): 7 mL via OROMUCOSAL

## 2015-03-18 MED ORDER — RISAQUAD PO CAPS
1.0000 | ORAL_CAPSULE | Freq: Every day | ORAL | Status: DC
  Administered 2015-03-18 – 2015-03-25 (×8): 1 via ORAL
  Filled 2015-03-18 (×8): qty 1

## 2015-03-18 MED ORDER — BUPROPION HCL ER (XL) 150 MG PO TB24
300.0000 mg | ORAL_TABLET | Freq: Every day | ORAL | Status: DC
  Administered 2015-03-19 – 2015-03-25 (×7): 300 mg via ORAL
  Filled 2015-03-18 (×7): qty 2

## 2015-03-18 MED ORDER — SODIUM CHLORIDE 0.9% FLUSH
3.0000 mL | Freq: Two times a day (BID) | INTRAVENOUS | Status: DC
  Administered 2015-03-18 – 2015-03-24 (×13): 3 mL via INTRAVENOUS

## 2015-03-18 MED ORDER — ONDANSETRON HCL 4 MG PO TABS
4.0000 mg | ORAL_TABLET | Freq: Four times a day (QID) | ORAL | Status: DC | PRN
  Administered 2015-03-19: 4 mg via ORAL
  Filled 2015-03-18: qty 1

## 2015-03-18 MED ORDER — IPRATROPIUM-ALBUTEROL 0.5-2.5 (3) MG/3ML IN SOLN
3.0000 mL | Freq: Once | RESPIRATORY_TRACT | Status: AC
  Administered 2015-03-18: 3 mL via RESPIRATORY_TRACT
  Filled 2015-03-18: qty 3

## 2015-03-18 MED ORDER — ONDANSETRON HCL 4 MG/2ML IJ SOLN
4.0000 mg | Freq: Four times a day (QID) | INTRAMUSCULAR | Status: DC | PRN
  Administered 2015-03-18 – 2015-03-20 (×2): 4 mg via INTRAVENOUS
  Filled 2015-03-18 (×2): qty 2

## 2015-03-18 MED ORDER — HYDROXYCHLOROQUINE SULFATE 200 MG PO TABS
200.0000 mg | ORAL_TABLET | Freq: Two times a day (BID) | ORAL | Status: DC
  Administered 2015-03-19 – 2015-03-25 (×13): 200 mg via ORAL
  Filled 2015-03-18 (×13): qty 1

## 2015-03-18 MED ORDER — PANTOPRAZOLE SODIUM 40 MG PO TBEC
40.0000 mg | DELAYED_RELEASE_TABLET | Freq: Every day | ORAL | Status: DC
  Administered 2015-03-18 – 2015-03-25 (×8): 40 mg via ORAL
  Filled 2015-03-18 (×8): qty 1

## 2015-03-18 MED ORDER — FOLIC ACID 1 MG PO TABS
1.0000 mg | ORAL_TABLET | Freq: Every day | ORAL | Status: DC
  Administered 2015-03-18 – 2015-03-24 (×7): 1 mg via ORAL
  Filled 2015-03-18 (×7): qty 1

## 2015-03-18 MED ORDER — ACETAMINOPHEN 325 MG PO TABS: 650.0000 mg | ORAL_TABLET | Freq: Four times a day (QID) | ORAL | Status: DC | PRN

## 2015-03-18 MED ORDER — DICYCLOMINE HCL 10 MG PO CAPS
10.0000 mg | ORAL_CAPSULE | Freq: Three times a day (TID) | ORAL | Status: DC | PRN
  Administered 2015-03-21 – 2015-03-22 (×4): 10 mg via ORAL
  Filled 2015-03-18 (×5): qty 1

## 2015-03-18 MED ORDER — CALCIUM CARBONATE-VITAMIN D 500-200 MG-UNIT PO TABS
2.0000 | ORAL_TABLET | Freq: Every day | ORAL | Status: DC
  Administered 2015-03-18 – 2015-03-24 (×7): 2 via ORAL
  Filled 2015-03-18 (×7): qty 2

## 2015-03-18 MED ORDER — FUROSEMIDE 10 MG/ML IJ SOLN
20.0000 mg | Freq: Two times a day (BID) | INTRAMUSCULAR | Status: DC
  Administered 2015-03-18 – 2015-03-19 (×3): 20 mg via INTRAVENOUS
  Filled 2015-03-18 (×3): qty 2

## 2015-03-18 MED ORDER — ACETAMINOPHEN 650 MG RE SUPP: 650.0000 mg | Freq: Four times a day (QID) | RECTAL | Status: DC | PRN

## 2015-03-18 MED ORDER — ENOXAPARIN SODIUM 40 MG/0.4ML ~~LOC~~ SOLN
40.0000 mg | SUBCUTANEOUS | Status: DC
Start: 2015-03-18 — End: 2015-03-25
  Administered 2015-03-18 – 2015-03-24 (×7): 40 mg via SUBCUTANEOUS
  Filled 2015-03-18 (×7): qty 0.4

## 2015-03-18 MED ORDER — LEVOFLOXACIN 500 MG PO TABS
500.0000 mg | ORAL_TABLET | Freq: Every day | ORAL | Status: AC
  Administered 2015-03-19: 500 mg via ORAL
  Filled 2015-03-18 (×2): qty 1

## 2015-03-18 NOTE — ED Notes (Signed)
Pt to be admitted - [facility] requests that she be removed from the venti mask and placed on high flow o2  - I called respiratory and spoke with Iona Beard and he stated that they cannot change her over in the ED but they will place her on high flow once she is admitted to an inpt room

## 2015-03-18 NOTE — ED Provider Notes (Signed)
[facility] Emergency Department Provider Note  ____________________________________________  Time seen: Approximately 4:22 PM  I have reviewed the triage vital signs and the nursing notes.   HISTORY  Chief Complaint Shortness of Breath    HPI Stephanie Patel is a 70 y.o. female who was diagnosed with the flu about a week ago and put on Levaquin. She's becoming increasingly short of breath only has 2 doses of Levaquin left. She has a nonproductive cough. She's not had any productive cough for several days. She's not had any fever for at least 2 days. However she called EMS when they got there her O2 sats on room air were 70%. He went up to 90% on 2 L and 100% on her percent nonrebreather. Patient's only medical problem is rheumatoid arthritis. Patient does not use oxygen at home normally. Again patient has not had a fever for the last couple days. Patient is not coughing up anything anymore.   Past Medical History  Diagnosis Date  . Rheumatoid arthritis ([place])   . IBS (irritable bowel syndrome)   . LVH (left ventricular hypertrophy)   . IBS (irritable bowel syndrome)     Patient Active Problem List   Diagnosis Date Noted  . Acute respiratory failure with hypoxia ([place]) 03/18/2015    Past Surgical History  Procedure Laterality Date  . Abdominal hysterectomy      No current outpatient prescriptions on file.  Allergies Keflex  Family History  Problem Relation Age of Onset  . Alzheimer's disease Mother   . Heart disease Father   . Mitral valve prolapse Father     Social History Social History  Substance Use Topics  . Smoking status: Former Research scientist (life sciences)  . Smokeless tobacco: None  . Alcohol Use: Yes     Comment: Wine occasional    Review of Systems Constitutional: No fever/chills Eyes: No visual changes. ENT: No sore throat. Cardiovascular: Denies chest pain. Respiratory: shortness of breath. Gastrointestinal: No abdominal pain.  No nausea, no  vomiting.  No diarrhea.  No constipation. Genitourinary: Negative for dysuria. Musculoskeletal: Negative for back pain. Skin: Negative for rash. Neurological: Negative for headaches, focal weakness or numbness.  10-point ROS otherwise negative.  ____________________________________________   PHYSICAL EXAM:  VITAL SIGNS: ED Triage Vitals  Enc Vitals Group     BP --      Pulse --      Resp --      Temp --      Temp src --      SpO2 03/18/15 1620 80 %     Weight --      Height --      Head Cir --      Peak Flow --      Pain Score --      Pain Loc --      Pain Edu? --      Excl. in [place]? --     Constitutional: Alert and oriented. Well appearing and in no acute distress. Eyes: Conjunctivae are normal. PERRL. EOMI. Head: Atraumatic. Nose: No congestion/rhinnorhea. Mouth/Throat: Mucous membranes are moist.  Oropharynx non-erythematous. Neck: No stridor.  Cardiovascular: Normal rate, regular rhythm. Grossly normal heart sounds.  Good peripheral circulation. Respiratory: Normal respiratory effort.  No retractions. Lungs CTAB. Gastrointestinal: Soft and nontender. No distention. No abdominal bruits. No CVA tenderness. Musculoskeletal: No lower extremity tenderness nor edema.  No joint effusions. Neurologic:  Normal speech and language. No gross focal neurologic deficits are appreciated. No gait instability. Skin:  Skin is warm, dry and intact. No rash noted. Psychiatric: Mood and affect are normal. Speech and behavior are normal.  ____________________________________________   LABS (all labs ordered are listed, but only abnormal results are displayed)  Labs Reviewed  COMPREHENSIVE METABOLIC PANEL - Abnormal; Notable for the following:    Total Protein 6.4 (*)    Albumin 3.1 (*)    ALT 13 (*)    All other components within normal limits  BRAIN NATRIURETIC PEPTIDE - Abnormal; Notable for the following:    B Natriuretic Peptide 133.0 (*)    All other components within  normal limits  TROPONIN I - Abnormal; Notable for the following:    Troponin I 0.04 (*)    All other components within normal limits  CBC WITH DIFFERENTIAL/PLATELET - Abnormal; Notable for the following:    WBC 11.9 (*)    RBC 3.70 (*)    RDW 15.1 (*)    Neutro Abs 9.0 (*)    Lymphs Abs 0.9 (*)    Monocytes Absolute 1.7 (*)    All other components within normal limits  LACTIC ACID, PLASMA  LACTIC ACID, PLASMA  BASIC METABOLIC PANEL  CBC   ____________________________________________  EKG  EKG read and interpreted by me shows normal sinus rhythm rate of 86 computer reads right axis might actually be right axis nonspecific ST-T wave changes ____________________________________________  RADIOLOGY  Radiologist reads chest x-ray as possibly CHF ____________________________________________   PROCEDURES   ____________________________________________   INITIAL IMPRESSION / ASSESSMENT AND PLAN / ED COURSE  Pertinent labs & imaging results that were available during my care of the patient were reviewed by me and considered in my medical decision making (see chart for details).  ____________________________________________   FINAL CLINICAL IMPRESSION(S) / ED DIAGNOSES  Final diagnoses:  Hypoxia  Abnormal chest x-ray      Nena Polio, MD 03/19/15 [phone]

## 2015-03-18 NOTE — H&P (Signed)
[place] [facility] [place] NAME: Stephanie Patel    MR#:  [id number]  DATE OF BIRTH:  1945/09/03  DATE OF ADMISSION:  03/18/2015  PRIMARY CARE PHYSICIAN: Idelle Crouch, MD   REQUESTING/REFERRING PHYSICIAN: Dr. Conni Slipper  CHIEF COMPLAINT:   Chief Complaint  Patient presents with  . Shortness of Breath    HISTORY OF PRESENT ILLNESS:  Stephanie Patel  is a 70 y.o. female with a known history of rheumatoid arthritis, irritable bowel syndrome, history of C. difficile colitis who presents to the hospital due to shortness of breath and cough aggressively getting worse over the past week. Patient saw her primary care physician who thought she may have a bronchitis and was started on a ten-day course of Levaquin. Patient finished 8/10 days of Levaquin but today was having significant shortness of breath and checked her own O2 sats at home as she has a pulse oximeter and it was in the 70s and she therefore came to the ER for further evaluation. In the emergency room patient was also noted to be hypoxic with O2 sats in the low 80s and was placed on a nonrebreather mask. She still admits to cough but is improved since she has started the Levaquin. She denies any paroxysmal nocturnal dyspnea, orthopnea, fever, chills, nausea, vomiting, chest pain or any other associated symptoms. Patient's chest x-ray findings in the emergency room was suggestive of possible CHF and therefore hospitalist services were contacted further treatment and evaluation.  PAST MEDICAL HISTORY:   Past Medical History  Diagnosis Date  . Rheumatoid arthritis ([place])   . IBS (irritable bowel syndrome)   . LVH (left ventricular hypertrophy)   . IBS (irritable bowel syndrome)     PAST SURGICAL HISTORY:   Past Surgical History  Procedure Laterality Date  . Abdominal hysterectomy      SOCIAL HISTORY:   Social History  Substance Use Topics  . Smoking status: Former Research scientist (life sciences)  .  Smokeless tobacco: Not on file  . Alcohol Use: Yes     Comment: Wine occasional    FAMILY HISTORY:   Family History  Problem Relation Age of Onset  . Alzheimer's disease Mother   . Heart disease Father   . Mitral valve prolapse Father     DRUG ALLERGIES:   Allergies  Allergen Reactions  . Keflex [Cephalexin] Hives    REVIEW OF SYSTEMS:   Review of Systems  Constitutional: Negative for fever and weight loss.  HENT: Negative for congestion, nosebleeds and tinnitus.   Eyes: Negative for blurred vision, double vision and redness.  Respiratory: Positive for cough and shortness of breath. Negative for hemoptysis.   Cardiovascular: Negative for chest pain, orthopnea, leg swelling and PND.  Gastrointestinal: Negative for nausea, vomiting, abdominal pain, diarrhea and melena.  Genitourinary: Negative for dysuria, urgency and hematuria.  Musculoskeletal: Negative for joint pain and falls.  Neurological: Negative for dizziness, tingling, sensory change, focal weakness, seizures, weakness and headaches.  Endo/Heme/Allergies: Negative for polydipsia. Does not bruise/bleed easily.  Psychiatric/Behavioral: Negative for depression and memory loss. The patient is not nervous/anxious.     MEDICATIONS AT HOME:   Prior to Admission medications   Medication Sig Start Date End Date Taking? Authorizing Provider  acidophilus (RISAQUAD) CAPS capsule Take 1 capsule by mouth daily.   Yes Historical Provider, MD  buPROPion (WELLBUTRIN XL) 300 MG 24 hr tablet Take 300 mg by mouth daily.   Yes Historical Provider, MD  Calcium Carbonate-Vit D-Min (CALCIUM  1200 PO) Take 1 tablet by mouth at bedtime.    Yes Historical Provider, MD  cetirizine (ZYRTEC) 10 MG tablet Take 10 mg by mouth at bedtime.    Yes Historical Provider, MD  dicyclomine (BENTYL) 10 MG capsule Take 10 mg by mouth 3 (three) times daily as needed for spasms.   Yes Historical Provider, MD  folic acid (FOLVITE) 1 MG tablet Take 1 mg by  mouth at bedtime.    Yes Historical Provider, MD  hydroxychloroquine (PLAQUENIL) 200 MG tablet Take 200 mg by mouth 2 (two) times daily.    Yes Historical Provider, MD  levofloxacin (LEVAQUIN) 500 MG tablet Take 500 mg by mouth daily. 03/11/15 03/21/15 Yes Historical Provider, MD  methotrexate (RHEUMATREX) 2.5 MG tablet Take 20 mg by mouth once a week. Pt takes on Wednesday.   Yes Historical Provider, MD  naproxen sodium (ANAPROX) 220 MG tablet Take 220 mg by mouth 2 (two) times daily as needed (for pain).    Yes Historical Provider, MD  omeprazole (PRILOSEC) 20 MG capsule Take 40 mg by mouth daily.   Yes Historical Provider, MD  valACYclovir (VALTREX) 500 MG tablet Take 500 mg by mouth every Monday, Wednesday, and Friday.    Yes Historical Provider, MD      VITAL SIGNS:  Blood pressure 121/74, pulse 83, resp. rate 23, height 5\' 4"  (1.626 m), weight 59.875 kg (132 lb), SpO2 94 %.  PHYSICAL EXAMINATION:  Physical Exam  GENERAL:  70 y.o.-year-old patient lying in the bed with no acute distress.  EYES: Pupils equal, round, reactive to light and accommodation. No scleral icterus. Extraocular muscles intact.  HEENT: Head atraumatic, normocephalic. Oropharynx and nasopharynx clear. No oropharyngeal erythema, moist oral mucosa  NECK:  Supple, no jugular venous distention. No thyroid enlargement, no tenderness.  LUNGS: Good air entry bilaterally, bibasilar crackles, no rhonchi, wheezing. No use of accessory muscles of respiration.  CARDIOVASCULAR: S1, S2 RRR. No murmurs, rubs, gallops, clicks.  ABDOMEN: Soft, nontender, nondistended. Bowel sounds present. No organomegaly or mass.  EXTREMITIES: No pedal edema, cyanosis, or clubbing. + 2 pedal & radial pulses b/l.   NEUROLOGIC: Cranial nerves II through XII are intact. No focal Motor or sensory deficits appreciated b/l PSYCHIATRIC: The patient is alert and oriented x 3. Good affect.  SKIN: No obvious rash, lesion, or ulcer.   LABORATORY PANEL:    CBC  Recent Labs Lab 03/18/15 1622  WBC 11.9*  HGB 12.4  HCT 36.8  PLT 314   ------------------------------------------------------------------------------------------------------------------  Chemistries   Recent Labs Lab 03/18/15 1622  NA 136  K 4.0  CL 101  CO2 27  GLUCOSE 97  BUN 18  CREATININE 0.75  CALCIUM 9.1  AST 29  ALT 13*  ALKPHOS 75  BILITOT 0.4   ------------------------------------------------------------------------------------------------------------------  Cardiac Enzymes  Recent Labs Lab 03/18/15 1622  TROPONINI 0.04*   ------------------------------------------------------------------------------------------------------------------  RADIOLOGY:  Dg Chest 2 View  03/18/2015  CLINICAL DATA:  Shortness of breath with cough and congestion EXAM: CHEST  2 VIEW COMPARISON:  July 01, 2014 FINDINGS: There is generalized interstitial edema. There is mild cardiomegaly with mild pulmonary venous hypertension. No adenopathy. No airspace consolidation. There is thoracolumbar levoscoliosis. IMPRESSION: Evidence of a degree of congestive heart failure. No airspace consolidation. Electronically Signed   By: Lowella Grip III M.D.   On: 03/18/2015 17:10     IMPRESSION AND PLAN:   70 year old female with past medical history of rheumatoid arthritis, irritable bowel syndrome, history of C. difficile presents to  the hospital due to shortness of breath and cough and noted to be in acute respiratory failure with hypoxia.  #1 acute respiratory failure with hypoxia-etiology currently unclear but suspected to be secondary to CHF. -We'll diurese with IV Lasix, follow I's and O's follow clinically and wean off oxygen as tolerated. -I will also check a CT scan noncontrast of the chest to rule out underlying chronic lung disease related to rheumatoid arthritis. -I will also continue her Levaquin to finish treatment for underlying bronchitis.  #2 CHF-unclear if it's  systolic versus diastolic. I will check the echocardiogram. -Diurese with IV Lasix, follow I's and O's and daily weights.  #3 history of rheumatoid arthritis-continue Plaquenil.  #4 irritable bowel syndrome-continue Bentyl.  #5 depression-continue Wellbutrin.    All the records are reviewed and case discussed with ED provider. Management plans discussed with the patient, family and they are in agreement.  CODE STATUS: Full Code  TOTAL TIME TAKING CARE OF THIS PATIENT: 45 minutes.    Henreitta Leber M.D on 03/18/2015 at 7:00 PM  Between 7am to 6pm - Pager - [phone]  After 6pm go to [email or web address] - password [email or web address] [facility]  Office  [phone]  CC: Primary care physician; Idelle Crouch, MD

## 2015-03-18 NOTE — ED Notes (Signed)
dx with flu 7 days ago, lung sounds clear, spo2 in the 70s at scene.  possible pnemonia, bronchitis per PCP. nsr 12 lead unremarkable 20 guage at home.  nrb spo2 increased to 100%, on [place] spo2 decreased to 91.  2 days 124/90, HR 82, cbg 111, 2 days of levoquin left.   no fever with ems noted.

## 2015-03-19 ENCOUNTER — Inpatient Hospital Stay
Admit: 2015-03-19 | Discharge: 2015-03-19 | Disposition: A | Discharge status: Home / Self Care | Payer: Medicare Other | Attending: Specialist | Admitting: Specialist

## 2015-03-19 LAB — BASIC METABOLIC PANEL
ANION GAP: 7 (ref 5–15)
BUN: 16 mg/dL (ref 6–20)
CALCIUM: 8.7 mg/dL — AB (ref 8.9–10.3)
CO2: 27 mmol/L (ref 22–32)
Chloride: 101 mmol/L (ref 101–111)
Creatinine, Ser: 0.71 mg/dL (ref 0.44–1.00)
GFR calc Af Amer: >60 mL/min (ref >60)
GFR calc non Af Amer: >60 mL/min (ref >60)
GLUCOSE: 116 mg/dL — AB (ref 65–99)
Potassium: 3.4 mmol/L — ABNORMAL LOW (ref 3.5–5.1)
Sodium: 135 mmol/L (ref 135–145)

## 2015-03-19 LAB — CBC
HCT: 35.1 % (ref 35.0–47.0)
HEMOGLOBIN: 11.9 g/dL — AB (ref 12.0–16.0)
MCH: 33.7 pg (ref 26.0–34.0)
MCHC: 33.9 g/dL (ref 32.0–36.0)
MCV: 99.2 fL (ref 80.0–100.0)
Platelets: 304 10*3/uL (ref 150–440)
RBC: 3.54 MIL/uL — ABNORMAL LOW (ref 3.80–5.20)
RDW: 14.6 % — ABNORMAL HIGH (ref 11.5–14.5)
WBC: 10.4 10*3/uL (ref 3.6–11.0)

## 2015-03-19 LAB — ECHOCARDIOGRAM COMPLETE
Height: 64 in
WEIGHTICAEL: 2112 [oz_av]

## 2015-03-19 MED ORDER — VALACYCLOVIR HCL 500 MG PO TABS: 500.0000 mg | ORAL_TABLET | ORAL | Status: DC

## 2015-03-19 MED ORDER — ENSURE ENLIVE PO LIQD
237.0000 mL | Freq: Two times a day (BID) | ORAL | Status: DC
  Administered 2015-03-20 – 2015-03-25 (×10): 237 mL via ORAL

## 2015-03-19 MED ORDER — METHYLPREDNISOLONE SODIUM SUCC 125 MG IJ SOLR
60.0000 mg | INTRAMUSCULAR | Status: DC
  Administered 2015-03-19 – 2015-03-20 (×2): 60 mg via INTRAVENOUS
  Filled 2015-03-19 (×3): qty 2

## 2015-03-19 MED ORDER — MAGIC MOUTHWASH
10.0000 mL | Freq: Three times a day (TID) | ORAL | Status: DC | PRN
  Administered 2015-03-19 – 2015-03-23 (×5): 10 mL via ORAL
  Filled 2015-03-19 (×7): qty 10

## 2015-03-19 MED ORDER — POTASSIUM CHLORIDE CRYS ER 20 MEQ PO TBCR
40.0000 meq | EXTENDED_RELEASE_TABLET | Freq: Once | ORAL | Status: AC
  Administered 2015-03-19: 40 meq via ORAL
  Filled 2015-03-19: qty 2

## 2015-03-19 NOTE — Progress Notes (Signed)
[place] at [place] NAME: Stephanie Patel    MR#:  [id number]  DATE OF BIRTH:  May 12, 1945  SUBJECTIVE:  Currently on high flow nasal cannula and states breathing feels better  REVIEW OF SYSTEMS:  CONSTITUTIONAL: No fever, fatigue or weakness.  EYES: No blurred or double vision.  EARS, NOSE, AND THROAT: No tinnitus or ear pain.  RESPIRATORY: Positive cough, shortness of breath, denies wheezing or hemoptysis.  CARDIOVASCULAR: No chest pain, orthopnea, edema.  GASTROINTESTINAL: No nausea, vomiting, diarrhea or abdominal pain.  GENITOURINARY: No dysuria, hematuria.  ENDOCRINE: No polyuria, nocturia,  HEMATOLOGY: No anemia, easy bruising or bleeding SKIN: No rash or lesion. MUSCULOSKELETAL: No joint pain or arthritis.   NEUROLOGIC: No tingling, numbness, weakness.  PSYCHIATRY: No anxiety or depression.   DRUG ALLERGIES:   Allergies  Allergen Reactions  . Keflex [Cephalexin] Hives    VITALS:  Blood pressure 119/72, pulse 74, temperature 97.6 F (36.4 C), temperature source Oral, resp. rate 18, height 5\' 4"  (1.626 m), weight 132 lb (59.875 kg), SpO2 95 %.  PHYSICAL EXAMINATION:  VITAL SIGNS: Filed Vitals:   03/19/15 0501 03/19/15 0508  BP: 119/72   Pulse: 81 74  Temp: 97.6 F (36.4 C)   Resp: 18    GENERAL:70 y.o.female currently in no acute distress.  HEAD: Normocephalic, atraumatic.  EYES: Pupils equal, round, reactive to light. Extraocular muscles intact. No scleral icterus.  MOUTH: Moist mucosal membrane. Dentition intact. No abscess noted.  EAR, NOSE, THROAT: Clear without exudates. No external lesions.  NECK: Supple. No thyromegaly. No nodules. No JVD.  PULMONARY: Fine crackles bilateral bases without wheeze. No use of accessory muscles, Good respiratory effort. good air entry bilaterally CHEST: Nontender to palpation.  CARDIOVASCULAR: S1 and S2. Regular rate and rhythm. No murmurs, rubs, or gallops. No edema. Pedal  pulses 2+ bilaterally.  GASTROINTESTINAL: Soft, nontender, nondistended. No masses. Positive bowel sounds. No hepatosplenomegaly.  MUSCULOSKELETAL: No swelling, clubbing, or edema. Range of motion full in all extremities.  NEUROLOGIC: Cranial nerves II through XII are intact. No gross focal neurological deficits. Sensation intact. Reflexes intact.  SKIN: No ulceration, lesions, rashes, or cyanosis. Skin warm and dry. Turgor intact.  PSYCHIATRIC: Mood, affect within normal limits. The patient is awake, alert and oriented x 3. Insight, judgment intact.      LABORATORY PANEL:   CBC  Recent Labs Lab 03/19/15 0334  WBC 10.4  HGB 11.9*  HCT 35.1  PLT 304   ------------------------------------------------------------------------------------------------------------------  Chemistries   Recent Labs Lab 03/18/15 1622 03/19/15 0334  NA 136 135  K 4.0 3.4*  CL 101 101  CO2 27 27  GLUCOSE 97 116*  BUN 18 16  CREATININE 0.75 0.71  CALCIUM 9.1 8.7*  AST 29  --   ALT 13*  --   ALKPHOS 75  --   BILITOT 0.4  --    ------------------------------------------------------------------------------------------------------------------  Cardiac Enzymes  Recent Labs Lab 03/18/15 1622  TROPONINI 0.04*   ------------------------------------------------------------------------------------------------------------------  RADIOLOGY:  Dg Chest 2 View  03/18/2015  CLINICAL DATA:  Shortness of breath with cough and congestion EXAM: CHEST  2 VIEW COMPARISON:  July 01, 2014 FINDINGS: There is generalized interstitial edema. There is mild cardiomegaly with mild pulmonary venous hypertension. No adenopathy. No airspace consolidation. There is thoracolumbar levoscoliosis. IMPRESSION: Evidence of a degree of congestive heart failure. No airspace consolidation. Electronically Signed   By: Lowella Grip III M.D.   On: 03/18/2015 17:10   Ct Chest Wo  Contrast  03/18/2015  CLINICAL DATA:  Known  history of rheumatoid arthritis, irritable bowel syndrome, history of C. difficile colitis presenting to the hospital due to shortness of breath and cough getting worse over the past week. Today with significant shortness of breath, hypoxic with O2 sats in the low 80s. Chest x-ray a showing a possible CHF. EXAM: CT CHEST WITHOUT CONTRAST TECHNIQUE: Multidetector CT imaging of the chest was performed following the standard protocol without IV contrast. COMPARISON:  Chest x-ray from earlier same day FINDINGS: Mediastinum/Lymph Nodes: No masses or pathologically enlarged lymph nodes identified on this un-enhanced exam. Heart size is upper normal. No pericardial effusion. Thoracic aorta is normal in caliber. Small hiatal hernia. Lungs/Pleura: Bibasilar pulmonary fibrosis, likely related to the history of rheumatoid arthritis. Additional patchy ground-glass opacities throughout both lungs, most likely related to edema. No pleural effusions seen. No pneumothorax. Trachea and central bronchi are unremarkable. Upper abdomen: No acute findings. Musculoskeletal: No acute or suspicious osseous lesion. Degenerative changes throughout the scoliotic thoracolumbar spine, mild to moderate in degree. Superficial soft tissues are unremarkable. IMPRESSION: 1. Chronic pulmonary fibrosis, bibasilar predominant, likely related to the given history of rheumatoid arthritis. 2. Additional patchy ground-glass opacities throughout both lungs. Differential includes atypical pneumonias such as viral or fungal, interstitial pneumonias, edema related to volume overload/CHF, chronic interstitial diseases, hypersensitivity pneumonitis, and respiratory bronchiolitis. Favor CHF. Electronically Signed   By: Franki Cabot M.D.   On: 03/18/2015 19:19    EKG:   Orders placed or performed during the hospital encounter of 03/18/15  . ED EKG  . ED EKG  . EKG 12-Lead  . EKG 12-Lead  . EKG 12-Lead  . EKG 12-Lead    ASSESSMENT AND PLAN:    70 year old Caucasian female history of rheumatoid arthritisOn methotrexate and Plaquenil presented with shortness of breath. Admitted 03/18/2015 for acute respiratory failure with hypoxia  1. Acute respiratory failure with hypoxia multifactorial: Baseline pulmonary fibrosis now worsened by acute bronchitis, continue oxygen and breathing treatments will add steroids-however no real treatment for fibrosis-discussed possibility patient may require long-term oxygen Given evidence of pulmonary edema echocardiogram is still pending at this time continue with diuresis, can decrease to daily Lasix when there is some improvement with the edema 2. Rheumatoid arthritis currently on methotrexate and Plaquenil: She's been on methotrexate for approximately 4 years given that this has a known complication of pulmonary fibrosis will consult rheumatology to see if a different agent would be beneficial 3. Hypokalemia: Replace goal 4-5 4. GERD without esophagitis: PPI therapy 5. Venous thromboembolism prophylactic: Lovenox    All the records are reviewed and case discussed with Care Management/Social Workerr. Management plans discussed with the patient, family and they are in agreement.  CODE STATUS: Full  TOTAL TIME TAKING CARE OF THIS PATIENT: 33 minutes.   POSSIBLE D/C IN 3-4 DAYS, DEPENDING ON CLINICAL CONDITION.   Hower,  Karenann Cai.D on 03/19/2015 at 9:43 AM  Between 7am to 6pm - Pager - [phone]  After 6pm: House Pager: - [phone]  [name]  Office  [phone]  CC: Primary care physician; Idelle Crouch, MD

## 2015-03-19 NOTE — Progress Notes (Signed)
Initial Nutrition Assessment     INTERVENTION:  Meals and snacks: Cater to pt preferences Medical Nutritional Supplement Therapy: Recommend adding ensure BID for added nutrition    NUTRITION DIAGNOSIS:   Inadequate oral intake related to acute illness, poor appetite as evidenced by per patient/family report.    GOAL:   Patient will meet greater than or equal to 90% of their needs    MONITOR:    (Energy intake, Pulmonary profile)  REASON FOR ASSESSMENT:   Malnutrition Screening Tool    ASSESSMENT:      Pt admitted with shortness of breath, CHF, acute respiratory failure  Past Medical History  Diagnosis Date  . Rheumatoid arthritis ([place])   . IBS (irritable bowel syndrome)   . LVH (left ventricular hypertrophy)   . IBS (irritable bowel syndrome)      Current Nutrition: Pt reports decreased intake for the past several months secondary to death of spouse and not feeling well. Reports eating 1/4 of typical intake   Gastrointestinal Profile: Last BM: 3/13   Scheduled Medications:  . acidophilus  1 capsule Oral Daily  . antiseptic oral rinse  7 mL Mouth Rinse BID  . buPROPion  300 mg Oral Daily  . calcium-vitamin D  2 tablet Oral QHS  . enoxaparin (LOVENOX) injection  40 mg Subcutaneous Q24H  . folic acid  1 mg Oral QHS  . furosemide  20 mg Intravenous Q12H  . hydroxychloroquine  200 mg Oral BID  . levofloxacin  500 mg Oral Daily  . loratadine  10 mg Oral Daily  . pantoprazole  40 mg Oral Daily  . sodium chloride flush  3 mL Intravenous Q12H  . valACYclovir  500 mg Oral Q M,W,F       Electrolyte/Renal Profile and Glucose Profile:   Recent Labs Lab 03/18/15 1622 03/19/15 0334  NA 136 135  K 4.0 3.4*  CL 101 101  CO2 27 27  BUN 18 16  CREATININE 0.75 0.71  CALCIUM 9.1 8.7*  GLUCOSE 97 116*   Protein Profile:  Recent Labs Lab 03/18/15 1622  ALBUMIN 3.1*     Nutrition-Focused Physical Exam Findings: Nutrition-Focused physical exam  completed. Findings are no fat depletion, no muscle depletion, and no edema.    Pt reports 5% wt loss in the last 8 months  Weight Trend since Admission: Filed Weights   03/18/15 1625  Weight: 132 lb (59.875 kg)   Pt reports started working with personal trainer in January but has had to stop because of illness  Diet Order:  Diet Heart Room service appropriate?: Yes; Fluid consistency:: Thin  Skin:   reviewed   Height:   Ht Readings from Last 1 Encounters:  03/18/15 5\' 4"  (1.626 m)    Weight:   Wt Readings from Last 1 Encounters:  03/18/15 132 lb (59.875 kg)    Ideal Body Weight:     BMI:  Body mass index is 22.65 kg/(m^2).  Estimated Nutritional Needs:   Kcal:  BEE 1109 kcals (IF 1.0-1.2, AF 1.3) 1441 -1730 kcals/d   Protein:  (1.1-1.3 g/kg) 66-78 g/d  Fluid:  (25-80ml/kg) 1500-1889ml/d  EDUCATION NEEDS:   No education needs identified at this time  [place]. Zenia Resides, [place], [place] (pager) Weekend/On-Call pager [phone])

## 2015-03-19 NOTE — Progress Notes (Signed)
*  PRELIMINARY RESULTS* Echocardiogram 2D Echocardiogram has been performed.  Stephanie Patel 03/19/2015, 10:41 AM

## 2015-03-20 MED ORDER — LEVOFLOXACIN 500 MG PO TABS
500.0000 mg | ORAL_TABLET | Freq: Every day | ORAL | Status: DC
  Administered 2015-03-20 – 2015-03-21 (×2): 500 mg via ORAL
  Filled 2015-03-20 (×2): qty 1

## 2015-03-20 MED ORDER — FUROSEMIDE 10 MG/ML IJ SOLN
20.0000 mg | Freq: Every day | INTRAMUSCULAR | Status: DC
  Administered 2015-03-20: 20 mg via INTRAVENOUS
  Filled 2015-03-20 (×2): qty 2

## 2015-03-20 NOTE — Progress Notes (Signed)
[place] at [place] NAME: Alejah Demarchi    MR#:  [id number]  DATE OF BIRTH:  [birth year]  SUBJECTIVE:  Patient states she symptomatically improved-breathing improved still has cough However, remains on high flow nasal cannula  REVIEW OF SYSTEMS:  CONSTITUTIONAL: No fever, fatigue or weakness.  EYES: No blurred or double vision.  EARS, NOSE, AND THROAT: No tinnitus or ear pain.  RESPIRATORY: Positive cough, shortness of breath, denies wheezing or hemoptysis.  CARDIOVASCULAR: No chest pain, orthopnea, edema.  GASTROINTESTINAL: No nausea, vomiting, diarrhea or abdominal pain.  GENITOURINARY: No dysuria, hematuria.  ENDOCRINE: No polyuria, nocturia,  HEMATOLOGY: No anemia, easy bruising or bleeding SKIN: No rash or lesion. MUSCULOSKELETAL: No joint pain or arthritis.   NEUROLOGIC: No tingling, numbness, weakness.  PSYCHIATRY: No anxiety or depression.   DRUG ALLERGIES:   Allergies  Allergen Reactions  . Keflex [Cephalexin] Hives    VITALS:  Blood pressure 130/80, pulse 80, temperature 97.9 F (36.6 C), temperature source Oral, resp. rate 19, height 5\' 4"  (1.626 m), weight 59.875 kg (132 lb), SpO2 93 %.  PHYSICAL EXAMINATION:  VITAL SIGNS: Filed Vitals:   03/20/15 0533 03/20/15 1247  BP: 112/71 130/80  Pulse: 79 80  Temp: 98 F (36.7 C) 97.9 F (36.6 C)  Resp: 20 19   GENERAL:70 y.o.female currently in no acute distress.  HEAD: Normocephalic, atraumatic.  EYES: Pupils equal, round, reactive to light. Extraocular muscles intact. No scleral icterus.  MOUTH: Moist mucosal membrane. Dentition intact. No abscess noted.  EAR, NOSE, THROAT: Clear without exudates. No external lesions.  NECK: Supple. No thyromegaly. No nodules. No JVD.  PULMONARY: Fine crackles bilateral bases without wheeze. No use of accessory muscles, Good respiratory effort. good air entry bilaterally CHEST: Nontender to palpation.  CARDIOVASCULAR: S1 and  S2. Regular rate and rhythm. No murmurs, rubs, or gallops. No edema. Pedal pulses 2+ bilaterally.  GASTROINTESTINAL: Soft, nontender, nondistended. No masses. Positive bowel sounds. No hepatosplenomegaly.  MUSCULOSKELETAL: No swelling, clubbing, or edema. Range of motion full in all extremities.  NEUROLOGIC: Cranial nerves II through XII are intact. No gross focal neurological deficits. Sensation intact. Reflexes intact.  SKIN: No ulceration, lesions, rashes, or cyanosis. Skin warm and dry. Turgor intact.  PSYCHIATRIC: Mood, affect within normal limits. The patient is awake, alert and oriented x 3. Insight, judgment intact.      LABORATORY PANEL:   CBC  Recent Labs Lab 03/19/15 0334  WBC 10.4  HGB 11.9*  HCT 35.1  PLT 304   ------------------------------------------------------------------------------------------------------------------  Chemistries   Recent Labs Lab 03/18/15 1622 03/19/15 0334  NA 136 135  K 4.0 3.4*  CL 101 101  CO2 27 27  GLUCOSE 97 116*  BUN 18 16  CREATININE 0.75 0.71  CALCIUM 9.1 8.7*  AST 29  --   ALT 13*  --   ALKPHOS 75  --   BILITOT 0.4  --    ------------------------------------------------------------------------------------------------------------------  Cardiac Enzymes  Recent Labs Lab 03/18/15 1622  TROPONINI 0.04*   ------------------------------------------------------------------------------------------------------------------  RADIOLOGY:  Dg Chest 2 View  03/18/2015  CLINICAL DATA:  Shortness of breath with cough and congestion EXAM: CHEST  2 VIEW COMPARISON:  July 01, 2014 FINDINGS: There is generalized interstitial edema. There is mild cardiomegaly with mild pulmonary venous hypertension. No adenopathy. No airspace consolidation. There is thoracolumbar levoscoliosis. IMPRESSION: Evidence of a degree of congestive heart failure. No airspace consolidation. Electronically Signed   By: Lowella Grip III M.D.  On:  03/18/2015 17:10   Ct Chest Wo Contrast  03/18/2015  CLINICAL DATA:  Known history of rheumatoid arthritis, irritable bowel syndrome, history of C. difficile colitis presenting to the hospital due to shortness of breath and cough getting worse over the past week. Today with significant shortness of breath, hypoxic with O2 sats in the low 80s. Chest x-ray a showing a possible CHF. EXAM: CT CHEST WITHOUT CONTRAST TECHNIQUE: Multidetector CT imaging of the chest was performed following the standard protocol without IV contrast. COMPARISON:  Chest x-ray from earlier same day FINDINGS: Mediastinum/Lymph Nodes: No masses or pathologically enlarged lymph nodes identified on this un-enhanced exam. Heart size is upper normal. No pericardial effusion. Thoracic aorta is normal in caliber. Small hiatal hernia. Lungs/Pleura: Bibasilar pulmonary fibrosis, likely related to the history of rheumatoid arthritis. Additional patchy ground-glass opacities throughout both lungs, most likely related to edema. No pleural effusions seen. No pneumothorax. Trachea and central bronchi are unremarkable. Upper abdomen: No acute findings. Musculoskeletal: No acute or suspicious osseous lesion. Degenerative changes throughout the scoliotic thoracolumbar spine, mild to moderate in degree. Superficial soft tissues are unremarkable. IMPRESSION: 1. Chronic pulmonary fibrosis, bibasilar predominant, likely related to the given history of rheumatoid arthritis. 2. Additional patchy ground-glass opacities throughout both lungs. Differential includes atypical pneumonias such as viral or fungal, interstitial pneumonias, edema related to volume overload/CHF, chronic interstitial diseases, hypersensitivity pneumonitis, and respiratory bronchiolitis. Favor CHF. Electronically Signed   By: Franki Cabot M.D.   On: 03/18/2015 19:19    EKG:   Orders placed or performed during the hospital encounter of 03/18/15  . ED EKG  . ED EKG  . EKG 12-Lead  .  EKG 12-Lead  . EKG 12-Lead  . EKG 12-Lead    ASSESSMENT AND PLAN:   70 year old Caucasian female history of rheumatoid arthritisOn methotrexate and Plaquenil presented with shortness of breath. Admitted 03/18/2015 for acute respiratory failure with hypoxia  1. Acute respiratory failure with hypoxia multifactorial: Baseline pulmonary fibrosis now worsened by acute bronchitis, continue oxygen and breathing treatments will add steroids-however no real treatment for fibrosis-discussed possibility patient may require long-term oxygen Echocardiogram within normal limits- decrease Lasix Case discussed with rheumatology [facility] consult pulmonary 2. Rheumatoid arthritis currently on methotrexate and Plaquenil: Appreciate rheumatology recommendations hold methotrexate for now continue steroids 3. Hypokalemia: Replace goal 4-5 4. GERD without esophagitis: PPI therapy 5. Venous thromboembolism prophylactic: Lovenox    All the records are reviewed and case discussed with Care Management/Social Workerr. Management plans discussed with the patient, family and they are in agreement.  CODE STATUS: Full  TOTAL TIME TAKING CARE OF THIS PATIENT: 28 minutes.   POSSIBLE D/C IN 3-4 DAYS, DEPENDING ON CLINICAL CONDITION.   Hower,  Karenann Cai.D on 03/20/2015 at 2:22 PM  Between 7am to 6pm - Pager - [phone]  After 6pm: House Pager: - [phone]  [name]  Office  [phone]  CC: Primary care physician; Idelle Crouch, MD
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
## 2015-03-20 NOTE — Care Management Important Message (Signed)
Important Message  Patient Details  Name: AMMANDA AFFLECK MRN: [id number] Date of Birth: 1945-05-19   Medicare Important Message Given:  Yes    Juliann Pulse A Ramonica Grigg 03/20/2015, 10:03 AM

## 2015-03-21 DIAGNOSIS — M05741 Rheumatoid arthritis with rheumatoid factor of right hand without organ or systems involvement: Secondary | ICD-10-CM

## 2015-03-21 DIAGNOSIS — M05742 Rheumatoid arthritis with rheumatoid factor of left hand without organ or systems involvement: Secondary | ICD-10-CM

## 2015-03-21 DIAGNOSIS — J9601 Acute respiratory failure with hypoxia: Secondary | ICD-10-CM

## 2015-03-21 DIAGNOSIS — J189 Pneumonia, unspecified organism: Principal | ICD-10-CM

## 2015-03-21 LAB — INFLUENZA PANEL BY PCR (TYPE A & B)
H1N1 flu by pcr: NOT DETECTED
Influenza A By PCR: NEGATIVE
Influenza B By PCR: NEGATIVE

## 2015-03-21 LAB — BASIC METABOLIC PANEL
Anion gap: 6 (ref 5–15)
BUN: 26 mg/dL — AB (ref 6–20)
CO2: 29 mmol/L (ref 22–32)
Calcium: 9.1 mg/dL (ref 8.9–10.3)
Chloride: 95 mmol/L — ABNORMAL LOW (ref 101–111)
Creatinine, Ser: 0.69 mg/dL (ref 0.44–1.00)
GFR calc Af Amer: >60 mL/min (ref >60)
GLUCOSE: 118 mg/dL — AB (ref 65–99)
POTASSIUM: 4.2 mmol/L (ref 3.5–5.1)
Sodium: 130 mmol/L — ABNORMAL LOW (ref 135–145)

## 2015-03-21 LAB — PROCALCITONIN

## 2015-03-21 MED ORDER — METHYLPREDNISOLONE SODIUM SUCC 125 MG IJ SOLR
80.0000 mg | Freq: Two times a day (BID) | INTRAMUSCULAR | Status: DC
  Administered 2015-03-21 – 2015-03-24 (×6): 80 mg via INTRAVENOUS
  Filled 2015-03-21 (×6): qty 2

## 2015-03-21 MED ORDER — DOXYCYCLINE HYCLATE 100 MG PO TABS
100.0000 mg | ORAL_TABLET | Freq: Two times a day (BID) | ORAL | Status: DC
  Administered 2015-03-21 – 2015-03-25 (×9): 100 mg via ORAL
  Filled 2015-03-21 (×9): qty 1

## 2015-03-21 NOTE — Progress Notes (Signed)
[place] at [place] NAME: Stephanie Patel    MR#:  [id number]  DATE OF BIRTH:  Mar 08, 1945  SUBJECTIVE:   Remains on [place] but shortness of breath improved.  No complaints.   REVIEW OF SYSTEMS:  CONSTITUTIONAL: No fever, fatigue or weakness.  EYES: No blurred or double vision.  EARS, NOSE, AND THROAT: No tinnitus or ear pain.  RESPIRATORY: Positive cough, shortness of breath, denies wheezing or hemoptysis.  CARDIOVASCULAR: No chest pain, orthopnea, edema.  GASTROINTESTINAL: No nausea, vomiting, diarrhea or abdominal pain.  GENITOURINARY: No dysuria, hematuria.  ENDOCRINE: No polyuria, nocturia,  HEMATOLOGY: No anemia, easy bruising or bleeding SKIN: No rash or lesion. MUSCULOSKELETAL: No joint pain or arthritis.   NEUROLOGIC: No tingling, numbness, weakness.  PSYCHIATRY: No anxiety or depression.   DRUG ALLERGIES:   Allergies  Allergen Reactions  . Keflex [Cephalexin] Hives    VITALS:  Blood pressure 109/87, pulse 79, temperature 97.7 F (36.5 C), temperature source Oral, resp. rate 20, height 5\' 4"  (1.626 m), weight 59.875 kg (132 lb), SpO2 93 %.  PHYSICAL EXAMINATION:  VITAL SIGNS: Filed Vitals:   03/20/15 2136 03/21/15 0429  BP: 115/79 109/87  Pulse: 91 79  Temp: 98 F (36.7 C) 97.7 F (36.5 C)  Resp: 16 20   GENERAL:69 y.o.female currently in no acute distress.  HEAD: Normocephalic, atraumatic.  EYES: Pupils equal, round, reactive to light. Extraocular muscles intact. No scleral icterus.  MOUTH: Moist mucosal membrane. Dentition intact. No abscess noted.  EAR, NOSE, THROAT: Clear without exudates. No external lesions.  NECK: Supple. No thyromegaly. No nodules. No JVD.  PULMONARY: Fine crackles bilateral bases without wheeze. No use of accessory muscles, Good respiratory effort. good air entry bilaterally CHEST: Nontender to palpation.  CARDIOVASCULAR: S1 and S2. Regular rate and rhythm. No murmurs, rubs, or  gallops. No edema. Pedal pulses 2+ bilaterally.  GASTROINTESTINAL: Soft, nontender, nondistended. No masses. Positive bowel sounds. No hepatosplenomegaly.  MUSCULOSKELETAL: No swelling, clubbing, or edema. Range of motion full in all extremities.  NEUROLOGIC: Cranial nerves II through XII are intact. No gross focal neurological deficits. Sensation intact. Reflexes intact.  SKIN: No ulceration, lesions, rashes, or cyanosis. Skin warm and dry. Turgor intact.  PSYCHIATRIC: Mood, affect within normal limits. The patient is awake, alert and oriented x 3. Insight, judgment intact.      LABORATORY PANEL:   CBC  Recent Labs Lab 03/19/15 0334  WBC 10.4  HGB 11.9*  HCT 35.1  PLT 304   ------------------------------------------------------------------------------------------------------------------  Chemistries   Recent Labs Lab 03/18/15 1622  03/21/15 0411  NA 136  < > 130*  K 4.0  < > 4.2  CL 101  < > 95*  CO2 27  < > 29  GLUCOSE 97  < > 118*  BUN 18  < > 26*  CREATININE 0.75  < > 0.69  CALCIUM 9.1  < > 9.1  AST 29  --   --   ALT 13*  --   --   ALKPHOS 75  --   --   BILITOT 0.4  --   --   < > = values in this interval not displayed. ------------------------------------------------------------------------------------------------------------------  Cardiac Enzymes  Recent Labs Lab 03/18/15 1622  TROPONINI 0.04*   ------------------------------------------------------------------------------------------------------------------  RADIOLOGY:  No results found.   ASSESSMENT AND PLAN:   70 year old Caucasian female history of rheumatoid arthritisOn methotrexate and Plaquenil presented with shortness of breath. Admitted 03/18/2015 for acute respiratory failure with  hypoxia  1. Acute respiratory failure with hypoxia multifactorial: Baseline pulmonary fibrosis now worsened by acute bronchitis -Unlikely CHF. We'll DC Lasix, DC telemetry. -Continue O2 supplementation and  wean as tolerated. Appreciate pulmonary consult. They have increased IV steroids and change Levaquin to doxycycline. We'll get a repeat chest x-ray tomorrow and based on that decide whether she needs a bronchoscopy with BAL lavage and biopsy to rule out infectious process -Appreciate pulmonary, rheumatology input.  2. Rheumatoid arthritis - Appreciate rheumatology recommendations hold methotrexate for now continue steroids, Plaquenil  3. Hypokalemia: Improved with supplementation.  4. GERD without esophagitis: Continue Protonix.   5. Depression-continue Wellbutrin.  All the records are reviewed and case discussed with Care Management/Social Workerr. Management plans discussed with the patient, family and they are in agreement.  CODE STATUS: Full  TOTAL TIME TAKING CARE OF THIS PATIENT: 25 minutes.   POSSIBLE D/C IN 2-3 DAYS, DEPENDING ON CLINICAL CONDITION.   Henreitta Leber M.D on 03/21/2015 at 2:56 PM  Between 7am to 6pm - Pager - [phone]  After 6pm: House Pager: - [phone]  [name]  Office  [phone]  CC: Primary care physician; Idelle Crouch, MD

## 2015-03-21 NOTE — Consult Note (Signed)
Full note to follow  IMPRESSION: 1) RA - MTX therapy X 2-3 yrs 2) 1-2 year history of "fatigue" which she is unable to say is NOT dyspnea 3) Normal CXR 05/31/14 4) Interstitial pneumonitis pattern on CXR 03/18/15 5) diffuse pneumonitis pattern on CT chest with minimal subpleural fibrosis 6) Acute (?on chronic) hypoxic respiratory failure  The acute problem is acute pneumonitis. DDX includes atypical and opportunisitc infections (including viral and pneumocystis) but I think this is more likely non-infectious including MTX induced hypersensitivity pneumonitis or RA - associated pneumonitis. The only way to definitively diagnose this is surgical lung biopsy which is not warranted at this time.  PLAN/REC: 1) change levofloxacin to doxycycline X 7 days (as she was already treated with levofloxacin PTA) 2) influenza PCR 3) Increase methylprednisolone to 80 mg IV q 12 hrs 4) Repeat CXR AM 03/17 5) Based on CXR findings, I will decide if bronchoscopy with BAL is warranted to r/o opportunistic infections. Presently I don't think it is needed. 6) wean O2 as able to standard [place] 7) She will need home O2 arranged - it is not clear whether this will be permanent or not 8) She will need to be discharged on prednisone and I will define dosing and taper prior to her discharge 9) She will need follow up in pulmonary office with me in 3-4 weeks after discharge with repeat CT chest @ that time. Once it appears that she has reached her new baseline respiratory status, PFTs will be obtained  Merton Border, MD PCCM service Mobile [phone] Pager [phone]

## 2015-03-22 ENCOUNTER — Telehealth: Payer: Self-pay | Admitting: *Deleted

## 2015-03-22 ENCOUNTER — Inpatient Hospital Stay: Payer: Medicare Other

## 2015-03-22 LAB — LEGIONELLA PNEUMOPHILA SEROGP 1 UR AG: L. PNEUMOPHILA SEROGP 1 UR AG: NEGATIVE

## 2015-03-22 MED ORDER — PREDNISONE 50 MG PO TABS: 60.0000 mg | ORAL_TABLET | Freq: Every day | ORAL | Status: DC

## 2015-03-22 NOTE — Telephone Encounter (Signed)
Pt informed of f/u appt. Nothing further needed.

## 2015-03-22 NOTE — Progress Notes (Signed)
[place] at [place] NAME: Stephanie Patel    MR#:  [id number]  DATE OF BIRTH:  06/19/1945  SUBJECTIVE:   Remains on Hiflo [place] but shortness of breath much improved.  CXR this a.m. Shows improvement and as per Pulm. No plans for bronch. No other complaints.   REVIEW OF SYSTEMS:  CONSTITUTIONAL: No fever, fatigue or weakness.  EYES: No blurred or double vision.  EARS, NOSE, AND THROAT: No tinnitus or ear pain.  RESPIRATORY: Positive cough, shortness of breath, denies wheezing or hemoptysis.  CARDIOVASCULAR: No chest pain, orthopnea, edema.  GASTROINTESTINAL: No nausea, vomiting, diarrhea or abdominal pain.  GENITOURINARY: No dysuria, hematuria.  ENDOCRINE: No polyuria, nocturia,  HEMATOLOGY: No anemia, easy bruising or bleeding SKIN: No rash or lesion. MUSCULOSKELETAL: No joint pain or arthritis.   NEUROLOGIC: No tingling, numbness, weakness.  PSYCHIATRY: No anxiety or depression.   DRUG ALLERGIES:   Allergies  Allergen Reactions  . Keflex [Cephalexin] Hives    VITALS:  Blood pressure 120/76, pulse 88, temperature 97.7 F (36.5 C), temperature source Oral, resp. rate 18, height 5\' 4"  (1.626 m), weight 59.875 kg (132 lb), SpO2 96 %.  PHYSICAL EXAMINATION:  VITAL SIGNS: Filed Vitals:   03/22/15 0503 03/22/15 1300  BP: 125/80 120/76  Pulse: 89 88  Temp: 97.8 F (36.6 C) 97.7 F (36.5 C)  Resp: 33 18   GENERAL:69 y.o.female currently in no acute distress.  HEAD: Normocephalic, atraumatic.  EYES: Pupils equal, round, reactive to light. Extraocular muscles intact. No scleral icterus.  MOUTH: Moist mucosal membrane. Dentition intact. No abscess noted.  EAR, NOSE, THROAT: Clear without exudates. No external lesions.  NECK: Supple. No thyromegaly. No nodules. No JVD.  PULMONARY: Fine crackles bilateral bases without wheeze. No use of accessory muscles, Good respiratory effort. good air entry bilaterally CHEST: Nontender to  palpation.  CARDIOVASCULAR: S1 and S2. Regular rate and rhythm. No murmurs, rubs, or gallops. No edema. Pedal pulses 2+ bilaterally.  GASTROINTESTINAL: Soft, nontender, nondistended. No masses. Positive bowel sounds. No hepatosplenomegaly.  MUSCULOSKELETAL: No swelling, clubbing, or edema. Range of motion full in all extremities.  NEUROLOGIC: Cranial nerves II through XII are intact. No gross focal neurological deficits. Sensation intact. Reflexes intact.  SKIN: No ulceration, lesions, rashes, or cyanosis. Skin warm and dry. Turgor intact.  PSYCHIATRIC: Mood, affect within normal limits. The patient is awake, alert and oriented x 3. Insight, judgment intact.      LABORATORY PANEL:   CBC  Recent Labs Lab 03/19/15 0334  WBC 10.4  HGB 11.9*  HCT 35.1  PLT 304   ------------------------------------------------------------------------------------------------------------------  Chemistries   Recent Labs Lab 03/18/15 1622  03/21/15 0411  NA 136  < > 130*  K 4.0  < > 4.2  CL 101  < > 95*  CO2 27  < > 29  GLUCOSE 97  < > 118*  BUN 18  < > 26*  CREATININE 0.75  < > 0.69  CALCIUM 9.1  < > 9.1  AST 29  --   --   ALT 13*  --   --   ALKPHOS 75  --   --   BILITOT 0.4  --   --   < > = values in this interval not displayed. ------------------------------------------------------------------------------------------------------------------  Cardiac Enzymes  Recent Labs Lab 03/18/15 1622  TROPONINI 0.04*   ------------------------------------------------------------------------------------------------------------------  RADIOLOGY:  Dg Chest 2 View  03/22/2015  CLINICAL DATA:  Pneumonitis.  Former smoker. EXAM: CHEST  2 VIEW COMPARISON:  03/18/2015 chest radiographs and CT FINDINGS: The cardiac silhouette remains upper limits of normal in size. Diffuse interstitial densities with a basilar predominance have mildly improved from the prior radiographs. No pleural effusion or  pneumothorax is identified. S shaped thoracolumbar scoliosis is noted. IMPRESSION: Mildly improved bilateral interstitial opacities. These may reflect improving edema or infection superimposed on interstitial lung disease. Electronically Signed   By: Logan Bores M.D.   On: 03/22/2015 08:27     ASSESSMENT AND PLAN:   70 year old Caucasian female history of rheumatoid arthritisOn methotrexate and Plaquenil presented with shortness of breath. Admitted 03/18/2015 for acute respiratory failure with hypoxia  1. Acute respiratory failure with hypoxia multifactorial: Baseline pulmonary fibrosis now worsened by acute bronchitis -Unlikely CHF. We'll DC Lasix, DC telemetry. -Continue O2 supplementation and wean as tolerated. Appreciate pulmonary consult. Cont. IV steriods, Doxy.  CXR this a.m. Looks better and no plans for bronch for now.  - can likely wean O2 and wean steroids in the next few days and then d/c home. Follow up with pulm. as outpatient.  Tolerate o2 sats 90% or higher and discussed w/ Resp.  -Appreciate pulmonary, rheumatology input.  2. Rheumatoid arthritis - Appreciate rheumatology recommendations hold methotrexate for now continue steroids, Plaquenil.  3. Hypokalemia: Improved with supplementation and will monitor.   4. GERD without esophagitis: Continue Protonix.   5. Depression-continue Wellbutrin.  D/c home when weaned off Hiflo [place].  All the records are reviewed and case discussed with Care Management/Social Workerr. Management plans discussed with the patient, family and they are in agreement.  CODE STATUS: Full  TOTAL TIME TAKING CARE OF THIS PATIENT: 25 minutes.   POSSIBLE D/C IN 2-3 DAYS, DEPENDING ON CLINICAL CONDITION.   Henreitta Leber M.D on 03/22/2015 at 2:59 PM  Between 7am to 6pm - Pager - [phone]  After 6pm: House Pager: - [phone]  [name]  Office  [phone]  CC: Primary care physician; Idelle Crouch, MD

## 2015-03-22 NOTE — Care Management Important Message (Signed)
Important Message  Patient Details  Name: Stephanie Patel MRN: [id number] Date of Birth: 1945/12/29   Medicare Important Message Given:  Yes    Juliann Pulse A Chirstine Defrain 03/22/2015, 9:38 AM

## 2015-03-22 NOTE — Telephone Encounter (Signed)
-----   Message from Wilhelmina Mcardle, MD sent at 03/21/2015  1:23 PM EDT ----- Please schedule post hospital follow up with me in 3-4 weeks from now. CXR prior to visit. Thanks  Waunita Schooner

## 2015-03-22 NOTE — Care Management (Signed)
Patient admitted with acute respiratory failure.  Patient lives at home alone.  Prior to admission independent of all activities.  Patient's adult daughter at bedside.  Both of her children live out of town.  Patient obtains her medications from [facility].  No home equipment.  Patient has been requiring O2 Via HFNC.  Patient has now been weaned to [place] 3 liters. RN to attempt to wean.  Will need qualifying O2 sats if indicated at discharge.  Per patients request I have provided her with of [facility] services.  PT consult pending.  RNCM to follow

## 2015-03-22 NOTE — Progress Notes (Signed)
   03/22/15 1215  Oxygen Therapy/Pulse Ox  O2 Device Nasal Cannula  O2 Therapy Oxygen humidified  O2 Flow Rate (L/min) 3 L/min  SpO2 95 %  Placed patient on nasal cannula running at 3lpm.  Oxygen saturation is 95% and the patient is tolerating well.  HFNC is in the room on stand-by.

## 2015-03-22 NOTE — Progress Notes (Signed)
PT PROFILE: RA - MTX therapy X 2-3 yrs. 1-2 year history of "fatigue" (which might be dyspnea). Normal CXR 05/31/14. Reportedly clear chest exam one month ago. Admitted with dyspnea and hypoxic respiratory failure. Interstitial pneumonitis pattern on CXR 03/18/15. Diffuse pneumonitis pattern on CT chest 03/18/15 with minimal subpleural fibrosis  SUBJ: No new complaints. Feels "a little better each day". Remains on HFNC @ 45% FiO2  OBJ: Filed Vitals:   03/21/15 1348 03/21/15 2016 03/22/15 0313 03/22/15 0503  BP: 114/68 113/72  125/80  Pulse: 81 84  89  Temp: 97.7 F (36.5 C) 98.1 F (36.7 C)  97.8 F (36.6 C)  TempSrc: Oral Oral  Oral  Resp: 18 15  17   Height:      Weight:      SpO2: 94% 96% 94% 97%   NAD No JVD Diffuse fine crackles Reg, no M NABS, soft No LE edema  BMP Latest Ref Rng 03/21/2015 03/19/2015 03/18/2015  Glucose 65 - 99 mg/dL 118(H) 116(H) 97  BUN 6 - 20 mg/dL 26(H) 16 18  Creatinine 0.44 - 1.00 mg/dL 0.69 0.71 0.75  Sodium 135 - 145 mmol/L 130(L) 135 136  Potassium 3.5 - 5.1 mmol/L 4.2 3.4(L) 4.0  Chloride 101 - 111 mmol/L 95(L) 101 101  CO2 22 - 32 mmol/L 29 27 27   Calcium 8.9 - 10.3 mg/dL 9.1 8.7(L) 9.1    CBC Latest Ref Rng 03/19/2015 03/18/2015 06/25/2012  WBC 3.6 - 11.0 K/uL 10.4 11.9(H) 10.9  Hemoglobin 12.0 - 16.0 g/dL 11.9(L) 12.4 10.8(L)  Hematocrit 35.0 - 47.0 % 35.1 36.8 30.5(L)  Platelets 150 - 440 K/uL 304 314 242   CXR: diffuse interstitial prominence - mildly improved  IMPRESSION: Acute (?on chronic) hypoxic respiratory failure Diffuse pneumonitis, NOS - doubt infectious. Possibly RA-related (NSIP, DIP) or MTX-related.   PLAN/REC: 1) Since there is some improvement in CXR appearance, will forgo bronchoscopy for now 2) Continue supplemental O2 to maintain SpO2 > 90 %  - try to transition from HFNC to conventional [place] 3) complete 5 days doxycycline 4) Continue methylprednisolone @ current dose for now. When transitioning to prednisone would  go to 60 mg daily X 5 days, 40 mg daily X 5 days, then 20 mg daily until she sees me in office 5) My office will contact her to schedule follow up in 3-4 weeks with repeat CXR 6) she will almost certainly need O2 after discharge. She should get a portable unit  Dr Stevenson Clinch is on for WE and I will ask him to see her at least once  Merton Border, MD PCCM service Mobile [phone] Pager [phone]

## 2015-03-23 LAB — PROCALCITONIN: Procalcitonin: <0.1 ng/mL

## 2015-03-23 NOTE — Evaluation (Signed)
Physical Therapy Evaluation Patient Details Name: Stephanie Patel MRN: [id number] DOB: October 11, 1945 Today's Date: 03/23/2015   History of Present Illness  Patient is a pleasant 70 y/o female that presents with acute respiratory failure. She has a 4-5 year history of RA.   Clinical Impression  Patient is a very pleasant 70 y/o female admitted after presenting to the ED with oxygen saturation levels in the low 70s on room air. She is typically quite active and does not require home O2. She demonstrates mild balance deficits in ambulation today, but is primarily limited by her cardiorespiratory status. On 3L of O2 she desaturates to 83% and requires 1-2 minutes on 4L of O2 to recover to 90% even during short ~100' of ambulation. She displayed this throughout multiple bouts of ambulation, with no demonstrable improvement in tolerance throughout session. Given the above limitations, would recommend HHPT to increase her tolerance for activity and improve her balance deficits noted in evaluation.     Follow Up Recommendations Home health PT (Or cardiopulmonary rehab pending her tolerance for prolonged activity after discharge)    Equipment Recommendations       Recommendations for Other Services       Precautions / Restrictions Precautions Precautions: None Restrictions Weight Bearing Restrictions: No      Mobility  Bed Mobility Overal bed mobility: Independent             General bed mobility comments: No deficits noted in bed mobility.   Transfers Overall transfer level: Independent               General transfer comment: No deficits in speed or balance with transfer.   Ambulation/Gait Ambulation/Gait assistance: Supervision Ambulation Distance (Feet): 300 Feet Assistive device: None Gait Pattern/deviations: WFL(Within Functional Limits);Drifts right/left   Gait velocity interpretation: Below normal speed for age/gender General Gait Details: Patient occasionally drifts  R/L, some scissoring on a few steps noted. Very mild loss of balance, which she was able to recover from independently.    SaO2 on room air at rest =  SaO2 on room air while ambulating = % SaO2 on 3 liters of O2 while ambulating = 83-91%  Stairs            Wheelchair Mobility    Modified Rankin (Stroke Patients Only)       Balance Overall balance assessment: Needs assistance   Sitting balance-Leahy Scale: Fair     Standing balance support: No upper extremity supported Standing balance-Leahy Scale: Fair                               Pertinent Vitals/Pain Pain Assessment:  (No mention of pain throughout evaluation,)    Home Living Family/patient expects to be discharged to:: Private residence Living Arrangements: Alone   Type of Home: House                Prior Function Level of Independence: Independent         Comments: Patient has been an independent ambulator with no recent falls.      Hand Dominance        Extremity/Trunk Assessment   Upper Extremity Assessment: Overall WFL for tasks assessed           Lower Extremity Assessment: Overall WFL for tasks assessed         Communication   Communication: No difficulties  Cognition Arousal/Alertness: Awake/alert Behavior During Therapy: WFL for tasks assessed/performed Overall  Cognitive Status: Within Functional Limits for tasks assessed                      General Comments      Exercises        Assessment/Plan    PT Assessment Patient needs continued PT services  PT Diagnosis Difficulty walking;Generalized weakness   PT Problem List Decreased strength;Decreased activity tolerance;Cardiopulmonary status limiting activity;Decreased balance;Decreased mobility  PT Treatment Interventions Gait training;Stair training;Therapeutic activities;Therapeutic exercise   PT Goals (Current goals can be found in the Care Plan section) Acute Rehab PT Goals Patient Stated  Goal: To improve her breathing.  PT Goal Formulation: With patient Time For Goal Achievement: 04/06/15 Potential to Achieve Goals: Good    Frequency Min 2X/week   Barriers to discharge Decreased caregiver support      Co-evaluation               End of Session Equipment Utilized During Treatment: Gait belt;Oxygen Activity Tolerance: Patient tolerated treatment well;Patient limited by fatigue Patient left: in bed;with call bell/phone within reach Nurse Communication: Mobility status         Time: 1349-1406 PT Time Calculation (min) (ACUTE ONLY): 17 min   Charges:   PT Evaluation $PT Eval Moderate Complexity: 1 Procedure     PT G Codes:       Kerman Passey, PT, DPT    03/23/2015, 5:04 PM

## 2015-03-23 NOTE — Progress Notes (Signed)
[place] at [place] NAME: Stephanie Patel    MR#:  [id number]  DATE OF BIRTH:  February 01, 1945  SUBJECTIVE:    shortness of breath much improved.  CXR this a.m. Shows improvement and as per Pulm. No plans for bronch. No other complaints.  Now on 3liter [place]  REVIEW OF SYSTEMS:  CONSTITUTIONAL: No fever, fatigue or weakness.  EYES: No blurred or double vision.  EARS, NOSE, AND THROAT: No tinnitus or ear pain.  RESPIRATORY: Positive cough, shortness of breath, denies wheezing or hemoptysis.  CARDIOVASCULAR: No chest pain, orthopnea, edema.  GASTROINTESTINAL: No nausea, vomiting, diarrhea or abdominal pain.  GENITOURINARY: No dysuria, hematuria.  ENDOCRINE: No polyuria, nocturia,  HEMATOLOGY: No anemia, easy bruising or bleeding SKIN: No rash or lesion. MUSCULOSKELETAL: No joint pain or arthritis.   NEUROLOGIC: No tingling, numbness, weakness.  PSYCHIATRY: No anxiety or depression.   DRUG ALLERGIES:   Allergies  Allergen Reactions  . Keflex [Cephalexin] Hives    VITALS:  Blood pressure 122/64, pulse 81, temperature 97.7 F (36.5 C), temperature source Oral, resp. rate 18, height 5\' 4"  (1.626 m), weight 59.875 kg (132 lb), SpO2 98 %.  PHYSICAL EXAMINATION:  VITAL SIGNS: Filed Vitals:   03/23/15 0500 03/23/15 1345  BP: 120/71 122/64  Pulse: 79 81  Temp: 97.5 F (36.4 C) 97.7 F (36.5 C)  Resp: 18 18   GENERAL:69 y.o.female currently in no acute distress.  HEAD: Normocephalic, atraumatic.  EYES: Pupils equal, round, reactive to light. Extraocular muscles intact. No scleral icterus.  MOUTH: Moist mucosal membrane. Dentition intact. No abscess noted.  EAR, NOSE, THROAT: Clear without exudates. No external lesions.  NECK: Supple. No thyromegaly. No nodules. No JVD.  PULMONARY: Fine crackles bilateral bases without wheeze. No use of accessory muscles, Good respiratory effort. good air entry bilaterally CHEST: Nontender to  palpation.  CARDIOVASCULAR: S1 and S2. Regular rate and rhythm. No murmurs, rubs, or gallops. No edema. Pedal pulses 2+ bilaterally.  GASTROINTESTINAL: Soft, nontender, nondistended. No masses. Positive bowel sounds. No hepatosplenomegaly.  MUSCULOSKELETAL: No swelling, clubbing, or edema. Range of motion full in all extremities.  NEUROLOGIC: Cranial nerves II through XII are intact. No gross focal neurological deficits. Sensation intact. Reflexes intact.  SKIN: No ulceration, lesions, rashes, or cyanosis. Skin warm and dry. Turgor intact.  PSYCHIATRIC: Mood, affect within normal limits. The patient is awake, alert and oriented x 3. Insight, judgment intact.      LABORATORY PANEL:   CBC  Recent Labs Lab 03/19/15 0334  WBC 10.4  HGB 11.9*  HCT 35.1  PLT 304   ------------------------------------------------------------------------------------------------------------------  Chemistries   Recent Labs Lab 03/18/15 1622  03/21/15 0411  NA 136  < > 130*  K 4.0  < > 4.2  CL 101  < > 95*  CO2 27  < > 29  GLUCOSE 97  < > 118*  BUN 18  < > 26*  CREATININE 0.75  < > 0.69  CALCIUM 9.1  < > 9.1  AST 29  --   --   ALT 13*  --   --   ALKPHOS 75  --   --   BILITOT 0.4  --   --   < > = values in this interval not displayed. ------------------------------------------------------------------------------------------------------------------  Cardiac Enzymes  Recent Labs Lab 03/18/15 1622  TROPONINI 0.04*   ------------------------------------------------------------------------------------------------------------------  RADIOLOGY:  Dg Chest 2 View  03/22/2015  CLINICAL DATA:  Pneumonitis.  Former smoker. EXAM: CHEST  2 VIEW COMPARISON:  03/18/2015 chest radiographs and CT FINDINGS: The cardiac silhouette remains upper limits of normal in size. Diffuse interstitial densities with a basilar predominance have mildly improved from the prior radiographs. No pleural effusion or  pneumothorax is identified. S shaped thoracolumbar scoliosis is noted. IMPRESSION: Mildly improved bilateral interstitial opacities. These may reflect improving edema or infection superimposed on interstitial lung disease. Electronically Signed   By: Logan Bores M.D.   On: 03/22/2015 08:27     ASSESSMENT AND PLAN:   70 year old Caucasian female history of rheumatoid arthritisOn methotrexate and Plaquenil presented with shortness of breath. Admitted 03/18/2015 for acute respiratory failure with hypoxia  1. Acute respiratory failure with hypoxia multifactorial: Baseline pulmonary fibrosis now worsened by acute bronchitis -Unlikely CHF. We'll DC Lasix, DC telemetry. -Continue O2 supplementation and wean as tolerated. Appreciate pulmonary consult. Cont. IV steriods, Doxy.  CXR this a.m. Looks better and no plans for bronch for now.  - can likely wean O2 and wean steroids in the next few days and then d/c home. Follow up with pulm. as outpatient.  Tolerate o2 sats 90% or higher and discussed w/ Resp.  -Appreciate pulmonary, rheumatology input.  2. Rheumatoid arthritis - Appreciate rheumatology recommendations hold methotrexate for now continue steroids, Plaquenil.  3. Hypokalemia: Improved with supplementation and will monitor.   4. GERD without esophagitis: Continue Protonix.   5. Depression-continue Wellbutrin.  D/c home in 1-2 days All the records are reviewed and case discussed with Care Management/Social Workerr. Management plans discussed with the patient, family and they are in agreement.  CODE STATUS: Full  TOTAL TIME TAKING CARE OF THIS PATIENT: 25 minutes.   POSSIBLE D/C IN 2-3 DAYS, DEPENDING ON CLINICAL CONDITION.   Stephanie Patel M.D on 03/23/2015 at 3:23 PM  Between 7am to 6pm - Pager - [phone]  After 6pm: House Pager: - [phone]  [name]  Office  [phone]  CC: Primary care physician; Stephanie Crouch, MD

## 2015-03-24 DIAGNOSIS — R0902 Hypoxemia: Secondary | ICD-10-CM

## 2015-03-24 MED ORDER — PREDNISONE 10 MG PO TABS
60.0000 mg | ORAL_TABLET | Freq: Every day | ORAL | Status: DC
  Administered 2015-03-25: 60 mg via ORAL
  Filled 2015-03-24: qty 1

## 2015-03-24 NOTE — Progress Notes (Signed)
PT PROFILE: RA - MTX therapy X 2-3 yrs. 1-2 year history of "fatigue" (which might be dyspnea). Normal CXR 05/31/14. Reportedly clear chest exam one month ago. Admitted with dyspnea and hypoxic respiratory failure. Interstitial pneumonitis pattern on CXR 03/18/15. Diffuse pneumonitis pattern on CT chest 03/18/15 with minimal subpleural fibrosis  SUBJ: No new complaints. Feels much better today, currently on 3L [place], still with dyspnea on exertion.   OBJ: Filed Vitals:   03/23/15 0500 03/23/15 1345 03/23/15 2025 03/24/15 0502  BP: 120/71 122/64 103/73 91/54  Pulse: 79 81 88 65  Temp: 97.5 F (36.4 C) 97.7 F (36.5 C) 98.1 F (36.7 C) 98 F (36.7 C)  TempSrc: Oral Oral Oral Oral  Resp: 18 18 20 18   Height:      Weight:      SpO2: 90% 98% 96% 97%   NAD No JVD Diffuse fine crackles Reg, no M NABS, soft No LE edema  BMP Latest Ref Rng 03/21/2015 03/19/2015 03/18/2015  Glucose 65 - 99 mg/dL 118(H) 116(H) 97  BUN 6 - 20 mg/dL 26(H) 16 18  Creatinine 0.44 - 1.00 mg/dL 0.69 0.71 0.75  Sodium 135 - 145 mmol/L 130(L) 135 136  Potassium 3.5 - 5.1 mmol/L 4.2 3.4(L) 4.0  Chloride 101 - 111 mmol/L 95(L) 101 101  CO2 22 - 32 mmol/L 29 27 27   Calcium 8.9 - 10.3 mg/dL 9.1 8.7(L) 9.1    CBC Latest Ref Rng 03/19/2015 03/18/2015 06/25/2012  WBC 3.6 - 11.0 K/uL 10.4 11.9(H) 10.9  Hemoglobin 12.0 - 16.0 g/dL 11.9(L) 12.4 10.8(L)  Hematocrit 35.0 - 47.0 % 35.1 36.8 30.5(L)  Platelets 150 - 440 K/uL 304 314 242     IMPRESSION: Acute (?on chronic) hypoxic respiratory failure Diffuse pneumonitis, NOS - doubt infectious. Possibly RA-related (NSIP, DIP) or MTX-related.   PLAN/REC: 1) Since there is some improvement in CXR appearance, will forgo bronchoscopy for now 2) Continue supplemental O2 to maintain SpO2 > 90 % 3) complete 5 days doxycycline 4) Transition to prednisone would go to 60 mg daily X 5 days, 40 mg daily X 5 days, then 20 mg daily until she sees me in office 5) [place] office will  contact her to schedule follow up in 3-4 weeks with repeat CXR 6) she will almost certainly need O2 after discharge. She should get a portable unit.  Walk test prior to [facility]  Pulmonary will manage O2 on the outpatient basis.   Thank you for consulting [facility], we will signoff at this time.  Please feel free to contact us with any questions at [phone] (please enter 7-digits).  Pulmonary consult time - 35 mins  Vilinda Boehringer, MD [facility] Pager [phone] (please enter 7-digits) On Call Pager - [phone] (please enter 7-digits)

## 2015-03-24 NOTE — Care Management Important Message (Signed)
Important Message  Patient Details  Name: LEIGHAN PINZONE MRN: [id number] Date of Birth: 1945/08/15   Medicare Important Message Given:  Yes    Rommel Hogston A, RN 03/24/2015, 2:26 PM

## 2015-03-24 NOTE — Progress Notes (Signed)
[place] at [place] NAME: Stephanie Patel    MR#:  [id number]  DATE OF BIRTH:  Apr 09, 1945  SUBJECTIVE:  shortness of breath much improved.  CXR this a.m. Shows improvement  Now on 3liter [place] REVIEW OF SYSTEMS:  CONSTITUTIONAL: No fever, fatigue or weakness.  EYES: No blurred or double vision.  EARS, NOSE, AND THROAT: No tinnitus or ear pain.  RESPIRATORY: Positive cough, shortness of breath, denies wheezing or hemoptysis.  CARDIOVASCULAR: No chest pain, orthopnea, edema.  GASTROINTESTINAL: No nausea, vomiting, diarrhea or abdominal pain.  GENITOURINARY: No dysuria, hematuria.  ENDOCRINE: No polyuria, nocturia,  HEMATOLOGY: No anemia, easy bruising or bleeding SKIN: No rash or lesion. MUSCULOSKELETAL: No joint pain or arthritis.   NEUROLOGIC: No tingling, numbness, weakness.  PSYCHIATRY: No anxiety or depression.   DRUG ALLERGIES:   Allergies  Allergen Reactions  . Keflex [Cephalexin] Hives    VITALS:  Blood pressure 91/54, pulse 65, temperature 98 F (36.7 C), temperature source Oral, resp. rate 18, height 5\' 4"  (1.626 m), weight 59.875 kg (132 lb), SpO2 97 %.  PHYSICAL EXAMINATION:  VITAL SIGNS: Filed Vitals:   03/23/15 2025 03/24/15 0502  BP: 103/73 91/54  Pulse: 88 65  Temp: 98.1 F (36.7 C) 98 F (36.7 C)  Resp: 20 18   GENERAL:69 y.o.female currently in no acute distress.  HEAD: Normocephalic, atraumatic.  EYES: Pupils equal, round, reactive to light. Extraocular muscles intact. No scleral icterus.  MOUTH: Moist mucosal membrane. Dentition intact. No abscess noted.  EAR, NOSE, THROAT: Clear without exudates. No external lesions.  NECK: Supple. No thyromegaly. No nodules. No JVD.  PULMONARY: Fine crackles bilateral bases without wheeze. No use of accessory muscles, Good respiratory effort. good air entry bilaterally CHEST: Nontender to palpation.  CARDIOVASCULAR: S1 and S2. Regular rate and rhythm. No murmurs,  rubs, or gallops. No edema. Pedal pulses 2+ bilaterally.  GASTROINTESTINAL: Soft, nontender, nondistended. No masses. Positive bowel sounds. No hepatosplenomegaly.  MUSCULOSKELETAL: No swelling, clubbing, or edema. Range of motion full in all extremities.  NEUROLOGIC: Cranial nerves II through XII are intact. No gross focal neurological deficits. Sensation intact. Reflexes intact.  SKIN: No ulceration, lesions, rashes, or cyanosis. Skin warm and dry. Turgor intact.  PSYCHIATRIC: Mood, affect within normal limits.  patient is awake, alert and oriented x 3. Insight, judgment intact.   LABORATORY PANEL:   CBC  Recent Labs Lab 03/19/15 0334  WBC 10.4  HGB 11.9*  HCT 35.1  PLT 304   ------------------------------------------------------------------------------------------------------------------  Chemistries   Recent Labs Lab 03/18/15 1622  03/21/15 0411  NA 136  < > 130*  K 4.0  < > 4.2  CL 101  < > 95*  CO2 27  < > 29  GLUCOSE 97  < > 118*  BUN 18  < > 26*  CREATININE 0.75  < > 0.69  CALCIUM 9.1  < > 9.1  AST 29  --   --   ALT 13*  --   --   ALKPHOS 75  --   --   BILITOT 0.4  --   --   < > = values in this interval not displayed. ------------------------------------------------------------------------------------------------------------------  Cardiac Enzymes  Recent Labs Lab 03/18/15 1622  TROPONINI 0.04*   ------------------------------------------------------------------------------------------------------------------  RADIOLOGY:  No results found.   ASSESSMENT AND PLAN:   70 year old Caucasian female history of rheumatoid arthritisOn methotrexate and Plaquenil presented with shortness of breath. Admitted 03/18/2015 for acute respiratory failure with hypoxia  1. Acute respiratory failure with hypoxia multifactorial: Baseline pulmonary fibrosis now worsened by acute bronchitis -Unlikely CHF. We'll DC Lasix, DC telemetry. -Continue O2 supplementation and  wean as tolerated. Appreciate pulmonary consult. Cont.po slow steriods, Doxy.  - can likely wean O2 and wean steroids in the next few days and then d/c home. Follow up with pulm. as outpatient.  Tolerate o2 sats 90% or higher and discussed w/ Resp.  -Appreciate pulmonary, rheumatology input.  2. Rheumatoid arthritis - Appreciate rheumatology recommendations hold methotrexate for now continue steroids, Plaquenil.  3. Hypokalemia: Improved with supplementation and will monitor.   4. GERD without esophagitis: Continue Protonix.   5. Depression-continue Wellbutrin.  D/c home in am  All the records are reviewed and case discussed with Care Management/Social Workerr. CODE STATUS: Full  TOTAL TIME TAKING CARE OF THIS PATIENT: 25 minutes.   POSSIBLE D/C IN AM DEPENDING ON CLINICAL CONDITION.   Lyvia Mondesir M.D on 03/24/2015 at 12:04 PM  Between 7am to 6pm - Pager - [phone]  After 6pm: House Pager: - [phone]  [name]  Office  [phone]  CC: Primary care physician; Idelle Crouch, MD

## 2015-03-25 LAB — CREATININE, SERUM
Creatinine, Ser: 0.95 mg/dL (ref 0.44–1.00)
GFR calc non Af Amer: 59 mL/min — ABNORMAL LOW (ref >60)

## 2015-03-25 LAB — PROCALCITONIN

## 2015-03-25 MED ORDER — ENSURE ENLIVE PO LIQD: 237.0000 mL | Freq: Two times a day (BID) | ORAL | Status: DC

## 2015-03-25 MED ORDER — PREDNISONE 20 MG PO TABS: 60.0000 mg | ORAL_TABLET | Freq: Every day | ORAL | Status: DC

## 2015-03-25 MED ORDER — DOXYCYCLINE HYCLATE 100 MG PO TABS: 100.0000 mg | ORAL_TABLET | Freq: Two times a day (BID) | ORAL | Status: DC

## 2015-03-25 NOTE — Care Management Note (Signed)
Case Management Note  Patient Details  Name: Stephanie Patel MRN: [id number] Date of Birth: July 22, 1945  Subjective/Objective:      [name] qualified for home oxygen. Will from [facility] will deliver a portable oxygen tank to her room and [place] will set up her new home oxygen.               Action/Plan:   Expected Discharge Date:                  Expected Discharge Plan:     In-House Referral:     Discharge planning Services     Post Acute Care Choice:    Choice offered to:     DME Arranged:    DME Agency:     HH Arranged:    [place] Agency:     Status of Service:     Medicare Important Message Given:  Yes Date Medicare IM Given:    Medicare IM give by:    Date Additional Medicare IM Given:    Additional Medicare Important Message give by:     If discussed at [place] of Stay Meetings, dates discussed:    Additional Comments:  Daleena Rotter A, RN 03/25/2015, 12:26 PM

## 2015-03-25 NOTE — Progress Notes (Signed)
SATURATION QUALIFICATIONS: (This note is used to comply with regulatory documentation for home oxygen)  Patient Saturations on Room Air at Rest = 93%  Patient Saturations on Room Air while Ambulating = 86%  Patient Saturations on 2 Liters of oxygen while Ambulating = 91%  Please briefly explain why patient needs home oxygen:  Pt desaturates while ambulating and with exertion while on room air.  [name], RN

## 2015-03-25 NOTE — Discharge Summary (Signed)
[facility] NAME: Stephanie Patel    MR#:  [id number]  DATE OF BIRTH:  09-28-1945  DATE OF ADMISSION:  03/18/2015 ADMITTING PHYSICIAN: Henreitta Leber, MD  DATE OF DISCHARGE: 03/25/2015  PRIMARY CARE PHYSICIAN: SPARKS,JEFFREY D, MD    ADMISSION DIAGNOSIS:  Hypoxia [R09.02] Abnormal chest x-ray [R93.8] Acute respiratory failure with hypoxia ([place]) [J96.01]  DISCHARGE DIAGNOSIS:  Acute hypoxic respiraotry failure Diffuse pneumonitis/Interstitial lung dz H/o RA  SECONDARY DIAGNOSIS:   Past Medical History  Diagnosis Date  . Rheumatoid arthritis ([place])   . IBS (irritable bowel syndrome)   . LVH (left ventricular hypertrophy)   . IBS (irritable bowel syndrome)     HOSPITAL COURSE:  70 year old Caucasian female history of rheumatoid arthritisOn methotrexate and Plaquenil presented with shortness of breath. Admitted 03/18/2015 for acute respiratory failure with hypoxia  1. Acute respiratory failure with hypoxia multifactorial: Baseline pulmonary fibrosis now worsened by diffuse pneumonitis . D/d Possibly RA-related (NSIP, DIP) or MTX-related.  -Continue O2 supplementation and wean as tolerated. Appreciate pulmonary consult. Cont.po slow steriods, Doxy.  - can likely wean O2 and wean steroids in the next few days and then d/c home. Follow up with pulm. as outpatient. Tolerate o2 sats 90% or higher and discussed w/ Resp.  -assess for home oxygen use -Appreciate  rheumatology input.   2. Rheumatoid arthritis - Appreciate rheumatology recommendations hold methotrexate for now continue steroids and  Plaquenil.  3. Hypokalemia: Improved with supplementation and will monitor.   4. GERD without esophagitis: Continue Protonix.   5. Depression-continue Wellbutrin.  trying to set up appt with [facility]  D/c home with HHPT, RN and oxygen CONSULTS OBTAINED:  Treatment Team:  Stephanie Patel.,  MD  DRUG ALLERGIES:   Allergies  Allergen Reactions  . Keflex [Cephalexin] Hives    DISCHARGE MEDICATIONS:   Current Discharge Medication List    START taking these medications   Details  doxycycline (VIBRA-TABS) 100 MG tablet Take 1 tablet (100 mg total) by mouth every 12 (twelve) hours. Qty: 6 tablet, Refills: 0    feeding supplement, ENSURE ENLIVE, (ENSURE ENLIVE) LIQD Take 237 mLs by mouth 2 (two) times daily between meals. Qty: 237 mL, Refills: 12    predniSONE (DELTASONE) 20 MG tablet Take 3 tablets (60 mg total) by mouth daily with breakfast. Take 60 mg daily for 5 days Then 40 mg daily for 5 days then 20 mg daily Qty: 60 tablet, Refills: 0      CONTINUE these medications which have NOT CHANGED   Details  acidophilus (RISAQUAD) CAPS capsule Take 1 capsule by mouth daily.    buPROPion (WELLBUTRIN XL) 300 MG 24 hr tablet Take 300 mg by mouth daily.    Calcium Carbonate-Vit D-Min (CALCIUM 1200 PO) Take 1 tablet by mouth at bedtime.     cetirizine (ZYRTEC) 10 MG tablet Take 10 mg by mouth at bedtime.     dicyclomine (BENTYL) 10 MG capsule Take 10 mg by mouth 3 (three) times daily as needed for spasms.    folic acid (FOLVITE) 1 MG tablet Take 1 mg by mouth at bedtime.     hydroxychloroquine (PLAQUENIL) 200 MG tablet Take 200 mg by mouth 2 (two) times daily.     naproxen sodium (ANAPROX) 220 MG tablet Take 220 mg by mouth 2 (two) times daily as needed (for pain).     omeprazole (PRILOSEC) 20 MG capsule Take 40 mg by mouth daily.  valACYclovir (VALTREX) 500 MG tablet Take 500 mg by mouth every Monday, Wednesday, and Friday.       STOP taking these medications     levofloxacin (LEVAQUIN) 500 MG tablet      methotrexate (RHEUMATREX) 2.5 MG tablet         If you experience worsening of your admission symptoms, develop shortness of breath, life threatening emergency, suicidal or homicidal thoughts you must seek medical attention immediately by calling 911 or  calling your MD immediately  if symptoms less severe.  You Must read complete instructions/literature along with all the possible adverse reactions/side effects for all the Medicines you take and that have been prescribed to you. Take any new Medicines after you have completely understood and accept all the possible adverse reactions/side effects.   Please note  You were cared for by a hospitalist during your hospital stay. If you have any questions about your discharge medications or the care you received while you were in the hospital after you are discharged, you can call the unit and asked to speak with the hospitalist on call if the hospitalist that took care of you is not available. Once you are discharged, your primary care physician will handle any further medical issues. Please note that NO REFILLS for any discharge medications will be authorized once you are discharged, as it is imperative that you return to your primary care physician (or establish a relationship with a primary care physician if you do not have one) for your aftercare needs so that they can reassess your need for medications and monitor your lab values. Today   SUBJECTIVE   Feels overall better. Wants to see psych as outpt  VITAL SIGNS:  Blood pressure 96/52, pulse 62, temperature 97.6 F (36.4 C), temperature source Oral, resp. rate 16, height 5\' 4"  (1.626 m), weight 59.875 kg (132 lb), SpO2 100 %.  I/O:   Intake/Output Summary (Last 24 hours) at 03/25/15 1051 Last data filed at 03/25/15 0900  Gross per 24 hour  Intake    240 ml  Output   1500 ml  Net  -1260 ml    PHYSICAL EXAMINATION:  GENERAL:  70 y.o.-year-old patient lying in the bed with no acute distress.  EYES: Pupils equal, round, reactive to light and accommodation. No scleral icterus. Extraocular muscles intact.  HEENT: Head atraumatic, normocephalic. Oropharynx and nasopharynx clear.  NECK:  Supple, no jugular venous distention. No thyroid  enlargement, no tenderness.  LUNGS: Normal breath sounds bilaterally, no wheezing, fine rales,rhonchi or crepitation. No use of accessory muscles of respiration.  CARDIOVASCULAR: S1, S2 normal. No murmurs, rubs, or gallops.  ABDOMEN: Soft, non-tender, non-distended. Bowel sounds present. No organomegaly or mass.  EXTREMITIES: No pedal edema, cyanosis, or clubbing.  NEUROLOGIC: Cranial nerves II through XII are intact. Muscle strength 5/5 in all extremities. Sensation intact. Gait not checked.  PSYCHIATRIC: The patient is alert and oriented x 3.  SKIN: No obvious rash, lesion, or ulcer.   DATA REVIEW:   CBC   Recent Labs Lab 03/19/15 0334  WBC 10.4  HGB 11.9*  HCT 35.1  PLT 304    Chemistries   Recent Labs Lab 03/18/15 1622  03/21/15 0411 03/25/15 0523  NA 136  < > 130*  --   K 4.0  < > 4.2  --   CL 101  < > 95*  --   CO2 27  < > 29  --   GLUCOSE 97  < > 118*  --  BUN 18  < > 26*  --   CREATININE 0.75  < > 0.69 0.95  CALCIUM 9.1  < > 9.1  --   AST 29  --   --   --   ALT 13*  --   --   --   ALKPHOS 75  --   --   --   BILITOT 0.4  --   --   --   < > = values in this interval not displayed.  Management plans discussed with the patient, family and they are in agreement.  CODE STATUS:     Code Status Orders        Start     Ordered   03/18/15 2053  Full code   Continuous     03/18/15 2052    Code Status History    Date Active Date Inactive Code Status Order ID Comments User Context   This patient has a current code status but no historical code status.    Advance Directive Documentation        Most Recent Value   Type of Advance Directive  Living will   Pre-existing out of facility DNR order (yellow form or pink MOST form)     "MOST" Form in Place?        TOTAL TIME TAKING CARE OF THIS PATIENT: *40 minutes.    Benen Weida M.D on 03/25/2015 at 10:51 AM  Between 7am to 6pm - Pager - [phone] After 6pm go to [email or web address] - password [email or web address]  [facility]  Office  [phone]  CC: Primary care physician; Idelle Crouch, MD

## 2015-03-25 NOTE — Discharge Instructions (Signed)
Use oxygen as per instrucitons

## 2015-03-25 NOTE — Care Management Note (Addendum)
Case Management Note  Patient Details  Name: Stephanie Patel MRN: [id number] Date of Birth: [birth year]  Subjective/Objective:      Discussed discharge planning with Silvio Clayman and she requested [facility] to be her home health provider. A referral for home health PT, RN was called to Floydene Flock at [facility]. Barnabas Lister, RN , is currently qualifying Stephanie Patel for new oxygen.               Action/Plan:   Expected Discharge Date:                  Expected Discharge Plan:     In-House Referral:     Discharge planning Services     Post Acute Care Choice:    Choice offered to:     DME Arranged:    DME Agency:     HH Arranged:    [place] Agency:     Status of Service:     Medicare Important Message Given:  Yes Date Medicare IM Given:    Medicare IM give by:    Date Additional Medicare IM Given:    Additional Medicare Important Message give by:     If discussed at [place] of Stay Meetings, dates discussed:    Additional Comments:  Prabhav Faulkenberry A, RN 03/25/2015, 11:15 AM

## 2015-03-25 NOTE — Progress Notes (Signed)
03/25/2015 15:00  [name] to be D/C'd Home per MD order.  Discussed prescriptions and follow up appointments with the patient. Prescriptions given to patient, medication list explained in detail. Pt verbalized understanding.    Medication List    STOP taking these medications        levofloxacin 500 MG tablet  Commonly known as:  LEVAQUIN     methotrexate 2.5 MG tablet  Commonly known as:  RHEUMATREX      TAKE these medications        acidophilus Caps capsule  Take 1 capsule by mouth daily.     buPROPion 300 MG 24 hr tablet  Commonly known as:  WELLBUTRIN XL  Take 300 mg by mouth daily.     CALCIUM 1200 PO  Take 1 tablet by mouth at bedtime.     cetirizine 10 MG tablet  Commonly known as:  ZYRTEC  Take 10 mg by mouth at bedtime.     dicyclomine 10 MG capsule  Commonly known as:  BENTYL  Take 10 mg by mouth 3 (three) times daily as needed for spasms.     doxycycline 100 MG tablet  Commonly known as:  VIBRA-TABS  Take 1 tablet (100 mg total) by mouth every 12 (twelve) hours.     feeding supplement (ENSURE ENLIVE) Liqd  Take 237 mLs by mouth 2 (two) times daily between meals.     folic acid 1 MG tablet  Commonly known as:  FOLVITE  Take 1 mg by mouth at bedtime.     hydroxychloroquine 200 MG tablet  Commonly known as:  PLAQUENIL  Take 200 mg by mouth 2 (two) times daily.     naproxen sodium 220 MG tablet  Commonly known as:  ANAPROX  Take 220 mg by mouth 2 (two) times daily as needed (for pain).     omeprazole 20 MG capsule  Commonly known as:  PRILOSEC  Take 40 mg by mouth daily.     predniSONE 20 MG tablet  Commonly known as:  DELTASONE  Take 3 tablets (60 mg total) by mouth daily with breakfast. Take 60 mg daily for 5 days Then 40 mg daily for 5 days then 20 mg daily     valACYclovir 500 MG tablet  Commonly known as:  VALTREX  Take 500 mg by mouth every Monday, Wednesday, and Friday.        Filed Vitals:   03/25/15 0525 03/25/15 1307  BP:  96/52 91/64  Pulse: 62 94  Temp: 97.6 F (36.4 C)   Resp: 16 17    Skin clean, dry and intact without evidence of skin break down, no evidence of skin tears noted. IV catheter discontinued intact. Site without signs and symptoms of complications. Dressing and pressure applied. Pt denies pain at this time. No complaints noted.  An After Visit Summary was printed and given to the patient. Patient escorted via [place], and D/C home via private auto.  [name]

## 2015-03-28 DIAGNOSIS — J849 Interstitial pulmonary disease, unspecified: Secondary | ICD-10-CM | POA: Insufficient documentation

## 2015-04-09 ENCOUNTER — Ambulatory Visit
Admission: RE | Admit: 2015-04-09 | Discharge: 2015-04-09 | Disposition: A | Discharge status: Home / Self Care | Payer: Medicare Other | Source: Ambulatory Visit | Attending: Pulmonary Disease | Admitting: Pulmonary Disease

## 2015-04-09 ENCOUNTER — Other Ambulatory Visit: Payer: Self-pay

## 2015-04-09 DIAGNOSIS — R918 Other nonspecific abnormal finding of lung field: Secondary | ICD-10-CM | POA: Diagnosis not present

## 2015-04-09 DIAGNOSIS — J9601 Acute respiratory failure with hypoxia: Secondary | ICD-10-CM | POA: Diagnosis present

## 2015-04-11 ENCOUNTER — Encounter: Payer: Self-pay | Admitting: Pulmonary Disease

## 2015-04-11 ENCOUNTER — Ambulatory Visit (INDEPENDENT_AMBULATORY_CARE_PROVIDER_SITE_OTHER): Payer: Medicare Other | Admitting: Pulmonary Disease

## 2015-04-11 VITALS — BP 130/84 | HR 85 | Ht 61.0 in | Wt 133.4 lb

## 2015-04-11 DIAGNOSIS — J189 Pneumonia, unspecified organism: Secondary | ICD-10-CM

## 2015-04-11 DIAGNOSIS — R0902 Hypoxemia: Secondary | ICD-10-CM | POA: Diagnosis not present

## 2015-04-11 MED ORDER — FLUTICASONE FUROATE-VILANTEROL 100-25 MCG/INH IN AEPB: 1.0000 | INHALATION_SPRAY | Freq: Every day | RESPIRATORY_TRACT | Status: DC

## 2015-04-11 MED ORDER — FLUTICASONE FUROATE-VILANTEROL 100-25 MCG/INH IN AEPB: 1.0000 | INHALATION_SPRAY | Freq: Every day | RESPIRATORY_TRACT | Status: AC

## 2015-04-11 NOTE — Patient Instructions (Signed)
Decrease prednisone to 10 mg daily - 1/2 tablet Follow up in 4 weeks after CT chest and lung function tests

## 2015-04-12 NOTE — Progress Notes (Signed)
PROBLEMS: Rheumatoid arthritis Hospitalized 3/13 - 03/25/15 with acute (?on chronic) hypoxemic respiratory failure and severe pneumonitis pattern on CXR, CT chest.  Treated with empiric antibiotics and systemic steroids with gradual improvement  Discharged home on tapering prednisone, home O2 Pulmonary fibrosis - previously on MTX  INTERVAL HISTORY: No major events since discharge  SUBJ: She feels that she is gradually improving. She has completed course of doxycycline. She remains on prednisone 20 mg daily. She has minimal nonproductive cough and mild PNDS. Denies fever, CP, hemoptysis. Remains moderately dyspneic with exertion.   OBJ: Filed Vitals:   04/11/15 1130  BP: 130/84  Pulse: 85  Height: 5\' 1"  (1.549 m)  Weight: 133 lb 6.4 oz (60.51 kg)  SpO2: 95%  2 lpm [place]   Gen: WDWN in NAD HEENT: All WNL Neck: NO LAN, no JVD noted Lungs: full BS, normal percussion note throughout, L>R basilar crackles, no wheezes, R basilar inspiratory squeak Cardiovascular: Reg rate, normal rhythm, no M noted Abdomen: Soft, NT +BS Ext: no C/C/E Neuro: CNs intact, motor/sens grossly intact Skin: No lesions noted   DATA: CXR 04/09/15: NSC diffuse bilateral interstitial prominence - fibrosis pattern  IMPRESSION: Rheumatoid arthritis 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  Recent acute on chronic acute hypoxemic respiratory failure - suspect acute pneumonitis. Possibly RA-related, possibly MTX induced   PLAN: Decrease Prednisone to 10 mg daily Begin trial of Breo 100/25 - one inhalation daily Would avoid MTX in future if reasonable alternatives exist ROV 4 weeks after HRCT and PFTs  Consider Pulmonary Rehab @ that time   Merton Border, MD PCCM service Mobile [phone] Pager [phone] 04/12/2015

## 2015-05-02 ENCOUNTER — Telehealth: Payer: Self-pay | Admitting: Pulmonary Disease

## 2015-05-02 ENCOUNTER — Telehealth: Payer: Self-pay | Admitting: *Deleted

## 2015-05-02 DIAGNOSIS — R0902 Hypoxemia: Secondary | ICD-10-CM

## 2015-05-02 NOTE — Telephone Encounter (Signed)
Called and spoke with Angela Nevin at [employer] at [employer].  She is going to check on status of referral and contact patient to arrange.Rhonda J Cobb

## 2015-05-02 NOTE — Telephone Encounter (Signed)
Order placed for PFT. 

## 2015-05-02 NOTE — Telephone Encounter (Signed)
Patient calling for orders to go to Pulmonary Rehab. Please call patient. She is frustrated for waiting so long to get this going.

## 2015-05-02 NOTE — Telephone Encounter (Signed)
Looked into epic and there was not a referral placed for pulmonary rehab. Pt comes in on 05/08/15 to have PFT and then see Dr. Alva Garnet on 05/09/15. At that time per last ov note, Dr. Alva Garnet stated he will address the Pulmonary Rehab referral with patient at that time.  Called and advised patient of this and she stated that she remembered that now and appreciated my call. Advised patient that the breathing test had to be completed before we could refer to Pulmonary Rehab.  Pt verbalized understanding and thanked me for my call. Nothing else needed at this time.  Rhonda J Cobb

## 2015-05-07 ENCOUNTER — Ambulatory Visit
Admission: RE | Admit: 2015-05-07 | Discharge: 2015-05-07 | Disposition: A | Discharge status: Home / Self Care | Payer: Medicare Other | Source: Ambulatory Visit | Attending: Pulmonary Disease | Admitting: Pulmonary Disease

## 2015-05-07 DIAGNOSIS — J479 Bronchiectasis, uncomplicated: Secondary | ICD-10-CM | POA: Diagnosis not present

## 2015-05-07 DIAGNOSIS — J84112 Idiopathic pulmonary fibrosis: Secondary | ICD-10-CM | POA: Diagnosis not present

## 2015-05-07 DIAGNOSIS — K449 Diaphragmatic hernia without obstruction or gangrene: Secondary | ICD-10-CM | POA: Diagnosis not present

## 2015-05-07 DIAGNOSIS — I2510 Atherosclerotic heart disease of native coronary artery without angina pectoris: Secondary | ICD-10-CM | POA: Diagnosis not present

## 2015-05-07 DIAGNOSIS — J189 Pneumonia, unspecified organism: Secondary | ICD-10-CM | POA: Insufficient documentation

## 2015-05-08 ENCOUNTER — Ambulatory Visit (INDEPENDENT_AMBULATORY_CARE_PROVIDER_SITE_OTHER): Payer: Medicare Other | Admitting: *Deleted

## 2015-05-08 ENCOUNTER — Telehealth: Payer: Self-pay | Admitting: Pulmonary Disease

## 2015-05-08 DIAGNOSIS — R0902 Hypoxemia: Secondary | ICD-10-CM

## 2015-05-08 DIAGNOSIS — J189 Pneumonia, unspecified organism: Secondary | ICD-10-CM

## 2015-05-08 LAB — PULMONARY FUNCTION TEST
DL/VA % PRED: 94 %
DL/VA: 4.16 ml/min/mmHg/L
DLCO UNC: 11.85 ml/min/mmHg
DLCO unc % pred: 58 %
FEF 25-75 POST: 2.77 L/s
FEF 25-75 Pre: 3.12 L/sec
FEF2575-%CHANGE-POST: -11 %
FEF2575-%PRED-POST: 164 %
FEF2575-%Pred-Pre: 184 %
FEV1-%CHANGE-POST: 4 %
FEV1-%Pred-Post: 76 %
FEV1-%Pred-Pre: 72 %
FEV1-POST: 1.49 L
FEV1-Pre: 1.43 L
FEV1FVC-%CHANGE-POST: 0 %
FEV1FVC-%PRED-PRE: 120 %
FEV6-%Change-Post: 5 %
FEV6-%PRED-PRE: 62 %
FEV6-%Pred-Post: 65 %
FEV6-POST: 1.64 L
FEV6-Pre: 1.56 L
FEV6FVC-%PRED-POST: 105 %
FEV6FVC-%Pred-Pre: 105 %
FVC-%Change-Post: 5 %
FVC-%PRED-POST: 62 %
FVC-%PRED-PRE: 59 %
FVC-POST: 1.64 L
FVC-PRE: 1.56 L
PRE FEV6/FVC RATIO: 100 %
Post FEV1/FVC ratio: 91 %
Post FEV6/FVC ratio: 100 %
Pre FEV1/FVC ratio: 91 %
RV % PRED: 51 %
RV: 1.05 L
TLC % pred: 58 %
TLC: 2.68 L

## 2015-05-08 NOTE — Telephone Encounter (Signed)
Need an order to d/c pt from [facility]. Dawn, [facility], states pt is doing well. Pt wakes up with her O2 off and she checks her sats and they are in 90s. Says lungs are clear and pt is very active. Please advise.

## 2015-05-08 NOTE — Telephone Encounter (Signed)
LMOM for Dawn to return call.

## 2015-05-08 NOTE — Progress Notes (Signed)
SMW performed today on RA as requested by DS.

## 2015-05-08 NOTE — Progress Notes (Signed)
PFT performed today with Nitrogen Washout.

## 2015-05-08 NOTE — Telephone Encounter (Signed)
Home health nurse calling asking if she can discharge patient today. She is with patient  Please advise

## 2015-05-09 ENCOUNTER — Ambulatory Visit (INDEPENDENT_AMBULATORY_CARE_PROVIDER_SITE_OTHER): Payer: Medicare Other | Admitting: Pulmonary Disease

## 2015-05-09 ENCOUNTER — Encounter: Payer: Self-pay | Admitting: Pulmonary Disease

## 2015-05-09 VITALS — BP 124/76 | HR 68 | Ht 61.0 in | Wt 133.0 lb

## 2015-05-09 DIAGNOSIS — J189 Pneumonia, unspecified organism: Secondary | ICD-10-CM

## 2015-05-09 DIAGNOSIS — M069 Rheumatoid arthritis, unspecified: Secondary | ICD-10-CM

## 2015-05-09 DIAGNOSIS — R0902 Hypoxemia: Secondary | ICD-10-CM | POA: Diagnosis not present

## 2015-05-09 DIAGNOSIS — J84112 Idiopathic pulmonary fibrosis: Secondary | ICD-10-CM

## 2015-05-09 NOTE — Telephone Encounter (Signed)
Per DS, can d/c pt from [facility]. LMOM that pt can be d/c'd. Nothing further needed.

## 2015-05-12 NOTE — Progress Notes (Signed)
PROBLEMS: Rheumatoid arthritis Hospitalized 3/13 - 03/25/15 with acute (?on chronic) hypoxemic respiratory failure and severe pneumonitis pattern on CXR, CT chest.  Treated with empiric antibiotics and systemic steroids with gradual improvement  Discharged home on tapering prednisone, home O2 Pulmonary fibrosis - previously on MTX 03/18/15 CT chest: Chronic pulmonary fibrosis, bibasilar predominant, likely related to the given history of rheumatoid arthritis. Diffuse patchy ground-glass opacities throughout both lungs. 05/07/15 CT chest: Persistence of fibrotic changes. Resolution of diffuse GGOs 05/08/15 PFT: Mild restriction, mod decrease DLCO. Minimal desaturation on 6 min walk (to 92%)  INTERVAL HISTORY: No major events  SUBJ: Feels much better. Exercise tolerance improved. Did not believe Breo helped at all. Currently on prednisone 5 mg daily.  No new complaints. Denies CP, fever, purulent sputum, hemoptysis, LE edema and calf tenderness   OBJ: Filed Vitals:   05/09/15 1130  BP: 124/76  Pulse: 68  Height: 5\' 1"  (1.549 m)  Weight: 133 lb (60.328 kg)  SpO2: 95%    Gen: NAD HEENT: WNL Neck: NO LAN, no JVD noted Lungs: full BS, normal percussion note throughout, faiint basilar crackles, no wheezes Cardiovascular: Reg rate, normal rhythm, no M noted Abdomen: Soft, NT +BS Ext: no C/C/E. 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 Resolved on CT chest 05/07/15 - Plan: Pulse oximetry, overnight  Rheumatoid arthritis  Hypoxemia, mild, improved - Plan: Pulse oximetry, overnight, CANCELED: Pulse oximetry, overnight    PLAN: 1) assess nocturnal O2 needs with overnight oximetry. DC O2 if no nocturnal desats 2) Continue pred @ 5 mg daily 3) I will contact Dr Jefm Bryant so that he may address any changes in RA therapy. Pt indicates that she does  not wish to retry MTX 4) ROV 3 months 5) ultimately, for rheumatoid lung, will need annual CXR and PFTs to monitor its course   Merton Border, MD PCCM service Mobile [phone] Pager [phone] 05/12/2015

## 2015-05-16 ENCOUNTER — Encounter: Payer: Self-pay | Admitting: Internal Medicine

## 2015-05-16 ENCOUNTER — Encounter: Payer: Self-pay | Admitting: Pulmonary Disease

## 2015-05-17 ENCOUNTER — Telehealth: Payer: Self-pay | Admitting: Pulmonary Disease

## 2015-05-17 NOTE — Telephone Encounter (Signed)
Pt informed she does need to continue the [facility]. Nothing further needed.

## 2015-05-17 NOTE — Telephone Encounter (Signed)
Pt calling asking if she needs to still be on Inhaler Brio  Please advise.

## 2015-05-23 ENCOUNTER — Other Ambulatory Visit: Payer: Self-pay | Admitting: Internal Medicine

## 2015-05-23 DIAGNOSIS — Z1231 Encounter for screening mammogram for malignant neoplasm of breast: Secondary | ICD-10-CM

## 2015-05-24 ENCOUNTER — Telehealth: Payer: Self-pay | Admitting: Pulmonary Disease

## 2015-05-24 NOTE — Telephone Encounter (Signed)
Patient calling for the results of her sleep study. For oxygen at night. Thanks!

## 2015-05-24 NOTE — Telephone Encounter (Signed)
Pt asking about ONO. Results in your folder. Please advise when you can. Thanks.

## 2015-06-04 ENCOUNTER — Telehealth: Payer: Self-pay

## 2015-06-04 ENCOUNTER — Other Ambulatory Visit: Payer: Self-pay

## 2015-06-04 NOTE — Telephone Encounter (Signed)
Pt. Informed of need of 2L 02 @ bedtime.  Pt states she currently wears 2L @ bedtime and will continue to do so. Nothing further needed.

## 2015-06-06 ENCOUNTER — Ambulatory Visit
Admission: RE | Admit: 2015-06-06 | Discharge: 2015-06-06 | Disposition: A | Discharge status: Home / Self Care | Payer: Medicare Other | Source: Ambulatory Visit | Attending: Internal Medicine | Admitting: Internal Medicine

## 2015-06-06 ENCOUNTER — Other Ambulatory Visit: Payer: Self-pay | Admitting: Internal Medicine

## 2015-06-06 DIAGNOSIS — Z1231 Encounter for screening mammogram for malignant neoplasm of breast: Secondary | ICD-10-CM | POA: Insufficient documentation

## 2015-06-28 ENCOUNTER — Telehealth: Payer: Self-pay | Admitting: Pulmonary Disease

## 2015-06-28 NOTE — Telephone Encounter (Signed)
Patient wants to talk about stopping prednisone .  Please call to discuss taper.  Please call patient .

## 2015-06-28 NOTE — Telephone Encounter (Signed)
Please advise 

## 2015-07-02 NOTE — Telephone Encounter (Signed)
Pt calling stating she has not heard from [place] and is checking back on this. Only has about 5 days worth of pills left needs to know before then  So if she needs a refill we can send that in as well. Please advise.

## 2015-07-08 NOTE — Telephone Encounter (Signed)
She is to follow recommendations per Dr Jefm Bryant which I think were to stop Prednisone. From my point of view, there is no need to continue prednisone for her lungs   Merton Border, MD PCCM service Mobile [phone] Pager [phone] 07/08/2015

## 2015-07-12 NOTE — Telephone Encounter (Signed)
See below

## 2015-07-24 ENCOUNTER — Telehealth: Payer: Self-pay | Admitting: Pulmonary Disease

## 2015-07-24 NOTE — Telephone Encounter (Signed)
I called her and discussed  Merton Border, MD PCCM service Mobile [phone] Pager [phone] 07/24/2015

## 2015-07-24 NOTE — Telephone Encounter (Signed)
Pt states she is on oxygen, and she is planning a trip to the [place] for 2 nights, pt asks if she is ok to go on this trip without her oxygen. She states that some nights she wakes up and doesn't have her oxygen on, and feels fine. Pt would like a call back today.

## 2015-07-24 NOTE — Telephone Encounter (Signed)
DS please advise. Thanks.  

## 2015-08-14 ENCOUNTER — Telehealth: Payer: Self-pay | Admitting: Pulmonary Disease

## 2015-08-14 ENCOUNTER — Encounter: Payer: Self-pay | Admitting: Pulmonary Disease

## 2015-08-14 ENCOUNTER — Ambulatory Visit (INDEPENDENT_AMBULATORY_CARE_PROVIDER_SITE_OTHER): Payer: Medicare Other | Admitting: Pulmonary Disease

## 2015-08-14 VITALS — BP 112/64 | HR 71 | Ht 60.0 in | Wt 134.2 lb

## 2015-08-14 DIAGNOSIS — M0510 Rheumatoid lung disease with rheumatoid arthritis of unspecified site: Secondary | ICD-10-CM

## 2015-08-14 DIAGNOSIS — R0902 Hypoxemia: Secondary | ICD-10-CM

## 2015-08-14 DIAGNOSIS — G4700 Insomnia, unspecified: Secondary | ICD-10-CM

## 2015-08-14 MED ORDER — TRAZODONE 25 MG HALF TABLET: 50.0000 mg | ORAL_TABLET | Freq: Every day | ORAL | Status: DC

## 2015-08-14 NOTE — Telephone Encounter (Signed)
Pt calling stating her pharmacy has [facility] has not received her prescription we sent in today.   Trazodone is the medication. Please advise.

## 2015-08-14 NOTE — Patient Instructions (Signed)
Continue nocturnal oxygen Resume exercise program Trial of trazodone for sleep - take approx 1 hour before sleep

## 2015-08-15 MED ORDER — TRAZODONE HCL 50 MG PO TABS: 50.0000 mg | ORAL_TABLET | Freq: Every day | ORAL | 2 refills | Status: DC

## 2015-08-15 NOTE — Telephone Encounter (Signed)
Pt informed medication has been sent to pharmacy. Nothing further needed.

## 2015-08-19 ENCOUNTER — Telehealth: Payer: Self-pay | Admitting: Pulmonary Disease

## 2015-08-19 DIAGNOSIS — J84112 Idiopathic pulmonary fibrosis: Secondary | ICD-10-CM

## 2015-08-19 NOTE — Telephone Encounter (Signed)
Order placed. Nothing further needed. 

## 2015-08-19 NOTE — Telephone Encounter (Signed)
Pt calling stating she needs [place] to [facility] to [facility] the extra oxygen tanks that she has  Please fax a prescription/letter saying that patient only needs them at night no longer needs it 24-7  Please fax to [phone]

## 2015-08-22 ENCOUNTER — Encounter: Payer: Self-pay | Admitting: Pulmonary Disease

## 2015-08-22 NOTE — Progress Notes (Signed)
PULMONARY OFFICE FOLLOW UP NOTE  PROBLEMS: Rheumatoid arthritis Hospitalized 3/13 - 03/25/15 with acute (?on chronic) hypoxemic respiratory failure and severe pneumonitis pattern on CXR, CT chest.  Treated with empiric antibiotics and systemic steroids with gradual improvement  Discharged home on tapering prednisone, home O2 Pulmonary fibrosis - previously on MTX 03/18/15 CT chest: Chronic pulmonary fibrosis, bibasilar predominant, likely related to the given history of rheumatoid arthritis. Diffuse patchy ground-glass opacities throughout both lungs. 05/07/15 CT chest: Persistence of fibrotic changes. Resolution of diffuse GGOs 05/08/15 PFT: Mild restriction, mod decrease DLCO. Minimal desaturation on 6 min walk (to 92%) 05/16/15 Overnight oximetry: relatively brief period of desaturation but to a low of 68%  INTERVAL HISTORY: Has been working with Dr Jefm Bryant on RA control. Recently started on leflunomide   SUBJ: No new respiratory complaints. Denies CP, fever, purulent sputum, hemoptysis, LE edema and calf tenderness. Here to review results of overnight oximetry. Reports problems with insomnia   OBJ: Vitals:   08/14/15 1145  BP: 112/64  Pulse: 71  SpO2: 97%  Weight: 60.9 kg (134 lb 3.2 oz)  Height: 5' (1.524 m)    Gen: NAD HEENT: WNL Neck: NO LAN, no JVD noted Lungs: full BS, normal percussion note throughout, faiint basilar crackles, no wheezes Cardiovascular: Reg rate, normal rhythm, no M noted Abdomen: Soft, NT +BS Ext: no C/C/E. 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 Responded to glucocorticoids 3) Nocturnal hypoxemia 3) insomnia, NOS   PLAN: 1) Continue nocturnal O2 @ 2 lpm [place] 2) Trazodone 50 mg PO q HS as needed for sleep 3) ROV 4 months with repeat CXR and PFTs to monitor progression or sooner PRN for increased respiratory  symptoms   Merton Border, MD PCCM service Mobile [phone] Pager [phone] 08/22/2015

## 2015-08-26 ENCOUNTER — Ambulatory Visit: Payer: Medicare Other

## 2015-09-13 ENCOUNTER — Telehealth: Payer: Self-pay | Admitting: Pulmonary Disease

## 2015-09-13 NOTE — Telephone Encounter (Signed)
Pt calling stating she is having a little catch  Is still on oxgyen at night  It usually works it self out It is on the right side Level this morning was 98 Not sure if she needs to be seen or if this is something else.

## 2015-09-13 NOTE — Telephone Encounter (Signed)
Soreness under rt breast. When she breathes in at times she gets a soreness and states she has been having acid reflux. Pt is taking her Prilosec QAM. Pt states she will go through the weekend and see if it gets better. She has also had a gas recently. Informed pt to take a Pepcid at night and cont Omeprazole in am and if no better Monday to give me a call back. Pt agrees and nothing further needed.

## 2015-09-13 NOTE — Telephone Encounter (Signed)
LMOVM for pt to call back to find out what she means but a "catch".

## 2015-09-16 ENCOUNTER — Telehealth: Payer: Self-pay | Admitting: Pulmonary Disease

## 2015-09-16 NOTE — Telephone Encounter (Signed)
Per DS, pt is to take Tylenol or Ibuprofen as needed.  Pt informed and agrees with response. Nothing further needed.

## 2015-09-16 NOTE — Telephone Encounter (Signed)
Pt calling stating she called Friday  Having some soreness in chest Stating it is better today but suffered all weekend she states Would like to know what needs to be done now. Please advise.

## 2015-10-21 ENCOUNTER — Ambulatory Visit (INDEPENDENT_AMBULATORY_CARE_PROVIDER_SITE_OTHER): Payer: Medicare Other | Admitting: Pulmonary Disease

## 2015-10-21 ENCOUNTER — Encounter: Payer: Self-pay | Admitting: Pulmonary Disease

## 2015-10-21 VITALS — BP 124/80 | HR 82 | Ht 60.0 in | Wt 131.0 lb

## 2015-10-21 DIAGNOSIS — M0510 Rheumatoid lung disease with rheumatoid arthritis of unspecified site: Secondary | ICD-10-CM | POA: Diagnosis not present

## 2015-10-21 DIAGNOSIS — R05 Cough: Secondary | ICD-10-CM

## 2015-10-21 DIAGNOSIS — G4700 Insomnia, unspecified: Secondary | ICD-10-CM

## 2015-10-21 DIAGNOSIS — R490 Dysphonia: Secondary | ICD-10-CM

## 2015-10-21 DIAGNOSIS — R059 Cough, unspecified: Secondary | ICD-10-CM

## 2015-10-21 DIAGNOSIS — G4734 Idiopathic sleep related nonobstructive alveolar hypoventilation: Secondary | ICD-10-CM | POA: Diagnosis not present

## 2015-10-21 DIAGNOSIS — J310 Chronic rhinitis: Secondary | ICD-10-CM

## 2015-10-21 MED ORDER — CETIRIZINE HCL 10 MG PO TABS: 10.0000 mg | ORAL_TABLET | Freq: Every day | ORAL | 10 refills | Status: DC

## 2015-10-21 MED ORDER — FLUTICASONE PROPIONATE 50 MCG/ACT NA SUSP: 2.0000 | Freq: Every day | NASAL | 10 refills | Status: DC

## 2015-10-21 NOTE — Patient Instructions (Signed)
1) Stop Breo  2) change Claritin back to Zyrtec 3) Flonase nasal inhaler  You already have follow up with me scheduled in November. We will get a Chest Xray and lung function tests prior to that visit

## 2015-10-21 NOTE — Progress Notes (Signed)
PULMONARY OFFICE FOLLOW UP NOTE  PROBLEMS: Rheumatoid arthritis Hospitalized 3/13 - 03/25/15 with acute (?on chronic) hypoxemic respiratory failure and severe pneumonitis pattern on CXR, CT chest.  Treated with empiric antibiotics and systemic steroids with gradual improvement  Discharged home on tapering prednisone, home O2 Pulmonary fibrosis - previously on MTX 03/18/15 CT chest: Chronic pulmonary fibrosis, bibasilar predominant, likely related to the given history of rheumatoid arthritis. Diffuse patchy ground-glass opacities throughout both lungs. 05/07/15 CT chest: Persistence of fibrotic changes. Resolution of diffuse GGOs 05/08/15 PFT: Mild restriction, mod decrease DLCO. Minimal desaturation on 6 min walk (to 92%) 05/16/15 Overnight oximetry: relatively brief period of desaturation but to a low of 68%  INTERVAL HISTORY: No major events  SUBJ: Here as an acute visit to evaluate NP cough, voice hoarseness and posterior nasal drainage of 2 weeks' duration. Cough is described as "raspy". Denies CP, fever, purulent sputum, hemoptysis, LE edema and calf tenderness. Here to review results of overnight oximetry.    OBJ: Vitals:   10/21/15 1047  BP: 124/80  Pulse: 82  SpO2: 94%  Weight: 131 lb (59.4 kg)  Height: 5' (1.524 m)    Gen: NAD HEENT: Mild rhinitis  Neck: NO LAN, no JVD noted Lungs: full BS, normal percussion note throughout, L>R basilar crackles, no wheezes Cardiovascular: Reg rate, normal rhythm, no M noted Abdomen: Soft, NT +BS Ext: no C/C/E. 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

## 2015-11-05 ENCOUNTER — Telehealth: Payer: Self-pay | Admitting: Pulmonary Disease

## 2015-11-05 NOTE — Telephone Encounter (Signed)
Please advise on message from pt below. We can try and get pt some oxygen to have with her on her trip. Please advise.

## 2015-11-05 NOTE — Telephone Encounter (Signed)
She should be fine without it as long as she will not be staying at altitude greater than 5000 feet.   Stephanie Patel

## 2015-11-05 NOTE — Telephone Encounter (Signed)
LMOVM for pt to call back 

## 2015-11-05 NOTE — Telephone Encounter (Signed)
Pt calling stating she is doing well with doing Oxygen at night But in couple weeks she is going on a trip and would be without it for about 2 nights  Would like to know if this is okay Please advise.

## 2015-11-06 NOTE — Telephone Encounter (Signed)
Pt informed of DS response. Nothing further needed.

## 2015-11-13 ENCOUNTER — Ambulatory Visit (INDEPENDENT_AMBULATORY_CARE_PROVIDER_SITE_OTHER): Payer: Medicare Other | Admitting: *Deleted

## 2015-11-13 ENCOUNTER — Ambulatory Visit
Admission: RE | Admit: 2015-11-13 | Discharge: 2015-11-13 | Disposition: A | Discharge status: Home / Self Care | Payer: Medicare Other | Source: Ambulatory Visit | Attending: Pulmonary Disease | Admitting: Pulmonary Disease

## 2015-11-13 DIAGNOSIS — R05 Cough: Secondary | ICD-10-CM

## 2015-11-13 DIAGNOSIS — R059 Cough, unspecified: Secondary | ICD-10-CM

## 2015-11-13 DIAGNOSIS — M0510 Rheumatoid lung disease with rheumatoid arthritis of unspecified site: Secondary | ICD-10-CM

## 2015-11-13 LAB — PULMONARY FUNCTION TEST
DL/VA % PRED: 102 %
DL/VA: 4.35 ml/min/mmHg/L
DLCO unc % pred: 331 %
DLCO unc: 62.64 ml/min/mmHg
FEF 25-75 POST: 1.77 L/s
FEF 25-75 Pre: 2.95 L/sec
FEF2575-%CHANGE-POST: -40 %
FEF2575-%PRED-POST: 109 %
FEF2575-%PRED-PRE: 183 %
FEV1-%Change-Post: 0 %
FEV1-%PRED-POST: 84 %
FEV1-%PRED-PRE: 84 %
FEV1-PRE: 1.58 L
FEV1-Post: 1.58 L
FEV1FVC-%CHANGE-POST: -1 %
FEV1FVC-%Pred-Pre: 119 %
FEV6-%CHANGE-POST: 1 %
FEV6-%PRED-PRE: 73 %
FEV6-%Pred-Post: 74 %
FEV6-Post: 1.76 L
FEV6-Pre: 1.74 L
FEV6FVC-%Pred-Post: 105 %
FEV6FVC-%Pred-Pre: 105 %
FVC-%CHANGE-POST: 1 %
FVC-%PRED-POST: 70 %
FVC-%PRED-PRE: 70 %
FVC-POST: 1.76 L
FVC-PRE: 1.74 L
POST FEV1/FVC RATIO: 90 %
PRE FEV1/FVC RATIO: 91 %
Post FEV6/FVC ratio: 100 %
Pre FEV6/FVC Ratio: 100 %
RV % pred: 72 %
RV: 1.47 L
TLC % pred: 79 %
TLC: 3.56 L

## 2015-11-13 NOTE — Progress Notes (Signed)
PFT performed today. Pt coughed and cleared throat frequently.

## 2015-11-15 ENCOUNTER — Encounter: Payer: Self-pay | Admitting: Pulmonary Disease

## 2015-11-15 ENCOUNTER — Ambulatory Visit (INDEPENDENT_AMBULATORY_CARE_PROVIDER_SITE_OTHER): Payer: Medicare Other | Admitting: Pulmonary Disease

## 2015-11-15 ENCOUNTER — Ambulatory Visit: Payer: Medicare Other | Admitting: Pulmonary Disease

## 2015-11-15 VITALS — BP 122/70 | HR 73 | Ht 60.0 in | Wt 129.8 lb

## 2015-11-15 DIAGNOSIS — M0510 Rheumatoid lung disease with rheumatoid arthritis of unspecified site: Secondary | ICD-10-CM | POA: Diagnosis not present

## 2015-11-15 DIAGNOSIS — G4734 Idiopathic sleep related nonobstructive alveolar hypoventilation: Secondary | ICD-10-CM | POA: Diagnosis not present

## 2015-11-15 NOTE — Progress Notes (Signed)
PULMONARY OFFICE FOLLOW UP NOTE  PROBLEMS: Rheumatoid arthritis Hospitalized 3/13 - 03/25/15 with acute (?on chronic) hypoxemic respiratory failure and severe pneumonitis pattern on CXR, CT chest.  Treated with empiric antibiotics and systemic steroids with gradual improvement  Discharged home on tapering prednisone, home O2 Pulmonary fibrosis - previously on MTX 03/18/15 CT chest: Chronic pulmonary fibrosis, bibasilar predominant, likely related to the given history of rheumatoid arthritis. Diffuse patchy ground-glass opacities throughout both lungs. 05/07/15 CT chest: Persistence of fibrotic changes. Resolution of diffuse GGOs 05/08/15 PFT: Mild restriction, mod decrease DLCO. Minimal desaturation on 6 min walk (to 92%) 05/16/15 Overnight oximetry: relatively brief period of desaturation but to a low of 68% 11/13/15 CXR: mild interstitial prominence. NAD 11/13/15 PFTs: no obstruction, mild restriction, DLCO measurement invalid   INTERVAL HISTORY: No major events  SUBJ: Cough is improved. Dyspnea is unchanged. No new complaints. No new issues. Here to review the CXR, PFTs as noted above   OBJ: Vitals:   11/15/15 1107  BP: 122/70  Pulse: 73  SpO2: 97%  Weight: 129 lb 12.8 oz (58.9 kg)  Height: 5' (1.524 m)    Gen: NAD HEENT: Mild rhinitis  Neck: NO LAN, no JVD noted Lungs: full BS, normal percussion note throughout, L>R basilar crackles, no wheezes Cardiovascular: Reg rate, normal rhythm, no M noted Abdomen: Soft, NT +BS Ext: no C/C/E. + RA changes in B hands   DATA: No new  IMPRESSION: Rheumatoid lung disease (HCC)  Nocturnal hypoxemia    PLAN: Remain off Breo as it was not beneficial and might have contributed to cough Continue nocturnal O2 Follow up in 4-6 months or PRN   Merton Border, MD PCCM service Mobile [phone] Pager [phone] 11/17/2015

## 2015-11-15 NOTE — Patient Instructions (Signed)
Your lung function tests and CXR look good - both mildly abnormal but stable to improved Continue oxygen at night with sleep Follow up in 4-6 months or as needed

## 2015-11-22 ENCOUNTER — Telehealth: Payer: Self-pay | Admitting: Pulmonary Disease

## 2015-11-22 NOTE — Telephone Encounter (Signed)
Pt would like to know if it is ok for her to fly. States she will be gone for 3 nights. Please call today and advise.

## 2015-11-22 NOTE — Telephone Encounter (Signed)
Please advise on message below.

## 2015-11-25 ENCOUNTER — Other Ambulatory Visit: Payer: Self-pay | Admitting: Pulmonary Disease

## 2015-11-27 ENCOUNTER — Other Ambulatory Visit: Payer: Self-pay | Admitting: Internal Medicine

## 2015-11-27 DIAGNOSIS — R251 Tremor, unspecified: Secondary | ICD-10-CM

## 2015-12-16 ENCOUNTER — Ambulatory Visit
Admission: RE | Admit: 2015-12-16 | Discharge: 2015-12-16 | Disposition: A | Discharge status: Home / Self Care | Payer: Medicare Other | Source: Ambulatory Visit | Attending: Internal Medicine | Admitting: Internal Medicine

## 2015-12-16 DIAGNOSIS — G319 Degenerative disease of nervous system, unspecified: Secondary | ICD-10-CM | POA: Insufficient documentation

## 2015-12-16 DIAGNOSIS — R251 Tremor, unspecified: Secondary | ICD-10-CM

## 2015-12-26 DIAGNOSIS — I10 Essential (primary) hypertension: Secondary | ICD-10-CM | POA: Insufficient documentation

## 2016-01-01 ENCOUNTER — Telehealth: Payer: Self-pay | Admitting: Pulmonary Disease

## 2016-01-01 MED ORDER — BENZONATATE 200 MG PO CAPS: 200.0000 mg | ORAL_CAPSULE | Freq: Three times a day (TID) | ORAL | 0 refills | Status: DC

## 2016-01-01 NOTE — Telephone Encounter (Signed)
Spoke with DR- Try otc delsym cough syrup Send in tessalon perles 200mg  1 po tid X7 days #21, 0 refills.    Spoke with pt, aware of recs.  rx sent to preferred pharmacy.  Nothing further needed.

## 2016-01-01 NOTE — Telephone Encounter (Signed)
Pt calling stating she has had a dry cough for the last month now and it doesn't seem to be getting any better Would like some advise on this Please call back

## 2016-01-01 NOTE — Telephone Encounter (Addendum)
Pt c/o continuous dry cough at times prod with clear mucus & nasal drainage X 59mo Pt denies any other pulmonary symptoms.  Pt is taking flonase daily with no relief. Pt is requesting recommendations.  DR please advise. Thanks.

## 2016-05-06 ENCOUNTER — Telehealth: Payer: Self-pay | Admitting: Pulmonary Disease

## 2016-05-06 NOTE — Telephone Encounter (Signed)
Pt informed of response and states she will use it 2 times daily. Nothing further needed.

## 2016-05-06 NOTE — Telephone Encounter (Signed)
Pt states she is having a cough that she states is not a deep cough and is having some drainage. States she is using Fluticasone nasal spray daily. Denies any SOB or chest tightness. Please advise.

## 2016-05-06 NOTE — Telephone Encounter (Signed)
Pt states she has developed a cough and a lot of drainage, especially in the morning and if she goes outside.

## 2016-05-06 NOTE — Telephone Encounter (Signed)
If she is very troubled by these symptoms, she may use Afrin nasal spray or equivalent once oor twice a day for the next 3 days. That should help dry things up  Stephanie Patel

## 2016-05-08 ENCOUNTER — Other Ambulatory Visit: Payer: Self-pay | Admitting: Pulmonary Disease

## 2016-05-18 ENCOUNTER — Ambulatory Visit
Admission: RE | Admit: 2016-05-18 | Discharge: 2016-05-18 | Disposition: A | Discharge status: Home / Self Care | Payer: Medicare Other | Source: Ambulatory Visit | Attending: Pulmonary Disease | Admitting: Pulmonary Disease

## 2016-05-18 ENCOUNTER — Encounter: Payer: Self-pay | Admitting: Pulmonary Disease

## 2016-05-18 ENCOUNTER — Ambulatory Visit (INDEPENDENT_AMBULATORY_CARE_PROVIDER_SITE_OTHER): Payer: Medicare Other | Admitting: Pulmonary Disease

## 2016-05-18 VITALS — BP 134/88 | HR 89 | Ht 60.0 in | Wt 123.0 lb

## 2016-05-18 DIAGNOSIS — R0982 Postnasal drip: Secondary | ICD-10-CM

## 2016-05-18 DIAGNOSIS — I700 Atherosclerosis of aorta: Secondary | ICD-10-CM | POA: Diagnosis not present

## 2016-05-18 DIAGNOSIS — M0510 Rheumatoid lung disease with rheumatoid arthritis of unspecified site: Secondary | ICD-10-CM

## 2016-05-18 DIAGNOSIS — J679 Hypersensitivity pneumonitis due to unspecified organic dust: Secondary | ICD-10-CM | POA: Diagnosis not present

## 2016-05-18 DIAGNOSIS — J8410 Pulmonary fibrosis, unspecified: Secondary | ICD-10-CM | POA: Insufficient documentation

## 2016-05-18 DIAGNOSIS — J301 Allergic rhinitis due to pollen: Secondary | ICD-10-CM | POA: Diagnosis not present

## 2016-05-18 MED ORDER — MONTELUKAST SODIUM 10 MG PO TABS: 10.0000 mg | ORAL_TABLET | Freq: Every day | ORAL | 5 refills | Status: DC

## 2016-05-18 MED ORDER — BENZONATATE 200 MG PO CAPS: 200.0000 mg | ORAL_CAPSULE | Freq: Three times a day (TID) | ORAL | 1 refills | Status: DC | PRN

## 2016-05-18 NOTE — Patient Instructions (Signed)
Singulair 10 mg daily (at bedtime) during the heavy pollen season Chest x-ray ordered today to follow-up on pulmonary fibrosis Follow-up in 4-6 months

## 2016-05-18 NOTE — Progress Notes (Signed)
PULMONARY OFFICE FOLLOW UP NOTE  PROBLEMS: Rheumatoid arthritis Hospitalized 3/13 - 03/25/15 with acute (?on chronic) hypoxemic respiratory failure and severe pneumonitis pattern on CXR, CT chest.  Treated with empiric antibiotics and systemic steroids with gradual improvement  Discharged home on tapering prednisone, home O2 Pulmonary fibrosis - previously on MTX 03/18/15 CT chest: Chronic pulmonary fibrosis, bibasilar predominant, likely related to the given history of rheumatoid arthritis. Diffuse patchy ground-glass opacities throughout both lungs. 05/07/15 CT chest: Persistence of fibrotic changes. Resolution of diffuse GGOs 05/08/15 PFT: Mild restriction, mod decrease DLCO. Minimal desaturation on 6 min walk (to 92%) 05/16/15 Overnight oximetry: relatively brief period of desaturation but to a low of 68% 11/13/15 CXR: mild interstitial prominence. NAD 11/13/15 PFTs: no obstruction, mild restriction, DLCO measurement invalid   INTERVAL HISTORY: No major events  SUBJ: Last seen in November 2017. No major events since that time. However, with the heavy pollens presently, she reports nasal congestion, posterior nasal drip and cough. She denies increasing shortness of breath. She denies fever, chest pain, purulent sputum, hemoptysis, orthopnea, paroxysmal nocturnal dyspnea, lower extremity edema and calf tenderness. She continues to work with Dr. Jefm Bryant trying to get her rheumatoid arthritis under control.   OBJ: Vitals:   05/18/16 1119  BP: 134/88  Pulse: 89  SpO2: 94%  Weight: 123 lb (55.8 kg)  Height: 5' (1.524 m)    Gen: NAD HEENT: Mild rhinitis  Neck: NO LAN, no JVD noted Lungs: Percussion note is normal, breath sounds are full, there are coarse bilateral crackles, but dominantly in the bases. Cardiovascular: Reg rate, normal rhythm, no M noted Abdomen: Soft, NT +BS Ext: no C/C/E. 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 She has had significant difficulty getting rheumatoid arthritis under control. She was well controlled on methotrexate. Therefore, if all else fails, we might consider a rechallenge with methotrexate. If we are to undertake this, I would like to obtain PFTs prior to doing so and then again 6 or 8 weeks into therapy.  PLAN: Trial of montelukast 10 mg by mouth daily at bedtime Chest x-ray ordered for today Follow-up in 4-6 months   Merton Border, MD PCCM service Mobile [phone] Pager [phone] 05/18/2016

## 2016-05-29 ENCOUNTER — Other Ambulatory Visit: Payer: Self-pay | Admitting: Internal Medicine

## 2016-05-29 DIAGNOSIS — Z1231 Encounter for screening mammogram for malignant neoplasm of breast: Secondary | ICD-10-CM

## 2016-06-18 ENCOUNTER — Ambulatory Visit
Admission: RE | Admit: 2016-06-18 | Discharge: 2016-06-18 | Disposition: A | Discharge status: Home / Self Care | Payer: Medicare Other | Source: Ambulatory Visit | Attending: Internal Medicine | Admitting: Internal Medicine

## 2016-06-18 DIAGNOSIS — Z1231 Encounter for screening mammogram for malignant neoplasm of breast: Secondary | ICD-10-CM | POA: Diagnosis not present

## 2016-07-02 ENCOUNTER — Telehealth: Payer: Self-pay | Admitting: Pulmonary Disease

## 2016-07-02 NOTE — Telephone Encounter (Signed)
Pt calling asking if we can call her and go over the medication list  Please call back

## 2016-07-02 NOTE — Telephone Encounter (Signed)
Patient inquiring about Pristiq. Informed patient we did not prescribe this medication. She will need to contact pcp. Nothing further needed.

## 2016-07-31 ENCOUNTER — Other Ambulatory Visit: Payer: Self-pay | Admitting: Pulmonary Disease

## 2016-08-02 ENCOUNTER — Other Ambulatory Visit: Payer: Self-pay | Admitting: Pulmonary Disease

## 2016-08-03 ENCOUNTER — Telehealth: Payer: Self-pay | Admitting: Internal Medicine

## 2016-08-03 ENCOUNTER — Other Ambulatory Visit: Payer: Self-pay | Admitting: *Deleted

## 2016-08-03 ENCOUNTER — Ambulatory Visit: Payer: Medicare Other | Admitting: Occupational Therapy

## 2016-08-03 ENCOUNTER — Telehealth: Payer: Self-pay | Admitting: Pulmonary Disease

## 2016-08-03 ENCOUNTER — Encounter: Payer: Self-pay | Admitting: Internal Medicine

## 2016-08-03 ENCOUNTER — Encounter: Payer: Self-pay | Admitting: *Deleted

## 2016-08-03 ENCOUNTER — Ambulatory Visit (INDEPENDENT_AMBULATORY_CARE_PROVIDER_SITE_OTHER): Payer: Medicare Other | Admitting: Internal Medicine

## 2016-08-03 VITALS — BP 130/88 | HR 78 | Resp 16 | Ht 60.0 in | Wt 120.0 lb

## 2016-08-03 DIAGNOSIS — J8410 Pulmonary fibrosis, unspecified: Secondary | ICD-10-CM

## 2016-08-03 MED ORDER — AZITHROMYCIN 250 MG PO TABS: ORAL_TABLET | ORAL | 0 refills | Status: DC

## 2016-08-03 MED ORDER — PREDNISONE 20 MG PO TABS: 20.0000 mg | ORAL_TABLET | Freq: Every day | ORAL | 0 refills | Status: DC

## 2016-08-03 MED ORDER — CETIRIZINE HCL 10 MG PO TABS: 10.0000 mg | ORAL_TABLET | Freq: Every day | ORAL | 6 refills | Status: DC

## 2016-08-03 NOTE — Progress Notes (Signed)
PULMONARY OFFICE FOLLOW UP NOTE  PROBLEMS: Rheumatoid arthritis Hospitalized 3/13 - 03/25/15 with acute (?on chronic) hypoxemic respiratory failure and severe pneumonitis pattern on CXR, CT chest.  Treated with empiric antibiotics and systemic steroids with gradual improvement  Discharged home on tapering prednisone, home O2 Pulmonary fibrosis - previously on MTX 03/18/15 CT chest: Chronic pulmonary fibrosis, bibasilar predominant, likely related to the given history of rheumatoid arthritis. Diffuse patchy ground-glass opacities throughout both lungs. 05/07/15 CT chest: Persistence of fibrotic changes. Resolution of diffuse GGOs 05/08/15 PFT: Mild restriction, mod decrease DLCO. Minimal desaturation on 6 min walk (to 92%) 05/16/15 Overnight oximetry: relatively brief period of desaturation but to a low of 68% 11/13/15 CXR: mild interstitial prominence. NAD 11/13/15 PFTs: no obstruction, mild restriction, DLCO measurement invalid   Chief complaint  nasal congestion and cough  SUBJ: Patient presents today with complaints of increased cough over the last 1 week Patient is also feeling down and out Patient has nasal congestion associated with chest congestion and feels like her bronchitis is coming back Patient has achy sinuses Agent has been taking Brio inhaler and it has helped along with the Mucinex Patient uses oxygen at nighttime 2 L nasal cannula  Patient states she has multiple allergies and has taken Zyrtec in the past Patient showing signs of sinus infection at this time Patient has significant lung disease consistent with postinflammatory pulmonary fibrosis noted on CAT scan which shows bilateral interstitial infiltrates   OBJ: Vitals:   08/03/16 0956 08/03/16 1000  BP:  130/88  Pulse:  78  Resp: 16   SpO2:  97%  Weight: 120 lb (54.4 kg)   Height: 5' (1.524 m)     Gen: NAD HEENT: Mild rhinitis  Neck: NO LAN, no JVD noted Lungs: full BS, normal percussion note  throughout, L>R basilar crackles, no wheezes Cardiovascular: Reg rate, normal rhythm, no M noted Abdomen: Soft, NT +BS Ext: no C/C/E. + RA changes in B hands     IMPRESSION: 71 year old pleasant white female with a history of rheumatoid arthritis in the setting of bilateral interstitial lung infiltrates consistent with postinflammatory pulmonary fibrosis now with acute cough with nasal congestion and chest congestions findings to suggest acute sinus infection in the setting of chronic hypoxic respiratory failure and allergic rhinitis  #1 sinus infection and chest congestion Will start prednisone 20 mg daily for 7 days Start Z-Pak Continue Brio daily as this helps Mucinex as needed  #2 chronic hypoxic respiratory failure continue oxygen therapy at night We will assess exertional hypoxia with 6 minute walk test   #3 chronic allergic rhinitis Patient advised to restart Zyrtec   Follow-up with Dr. Jamal Collin as scheduled   Patient satisfied with Plan of action and management. All questions answered  Corrin Parker, M.D.  [facility]  Medical Director [place] Director [facility] [place] Cardio-Pulmonary Department

## 2016-08-03 NOTE — Patient Instructions (Signed)
Prednisone 20 mg daily for 7 days Z-Pak Restart Zyrtec Check 6 minute walk test

## 2016-08-03 NOTE — Telephone Encounter (Signed)
Pt calling stating she just got home stating she doesn't have much of the prednisone at home Would like [place] to call in a prescription to [facility] for the 20 mg for 7 days please

## 2016-08-03 NOTE — Telephone Encounter (Signed)
Made patient aware Prednisone 20mg  was sent in during morning visit.

## 2016-08-03 NOTE — Telephone Encounter (Signed)
Pt states she has had a rought weekend, states she has had a bad cough, and doesn't want to end up in the hospital again. Please call.

## 2016-08-03 NOTE — Telephone Encounter (Signed)
App[t scheduled for patient to come in at 9:45 this am with DK.

## 2016-08-12 ENCOUNTER — Ambulatory Visit (INDEPENDENT_AMBULATORY_CARE_PROVIDER_SITE_OTHER): Payer: Medicare Other | Admitting: *Deleted

## 2016-08-12 DIAGNOSIS — J849 Interstitial pulmonary disease, unspecified: Secondary | ICD-10-CM

## 2016-08-13 ENCOUNTER — Ambulatory Visit: Payer: Medicare Other | Attending: Rheumatology | Admitting: Occupational Therapy

## 2016-08-13 DIAGNOSIS — M25641 Stiffness of right hand, not elsewhere classified: Secondary | ICD-10-CM | POA: Diagnosis present

## 2016-08-13 DIAGNOSIS — R278 Other lack of coordination: Secondary | ICD-10-CM

## 2016-08-13 DIAGNOSIS — M25642 Stiffness of left hand, not elsewhere classified: Secondary | ICD-10-CM | POA: Diagnosis not present

## 2016-08-13 NOTE — Progress Notes (Signed)
SMW performed today.   SIX MIN WALK 05/08/2015  Medications Breo, Plaquenil, Naproxen, Prilosec, Prednisone,   Supplimental Oxygen during Test? (L/min) No  Laps 6  Partial Lap (in Meters) 12  Baseline BP (sitting) 118/72  Baseline Heartrate 79  Baseline Dyspnea (Borg Scale) 0  Baseline Fatigue (Borg Scale) 0  Baseline SPO2 98  BP (sitting) 138/90  Heartrate 95  Dyspnea (Borg Scale) 0.5  Fatigue (Borg Scale) 0  SPO2 91  BP (sitting) 124/86  Heartrate 83  SPO2 96  Stopped or Paused before Six Minutes No  Distance Completed 300  Tech Comments: Pt walked at moderate pace.    Oscar La, LPN

## 2016-08-13 NOTE — Patient Instructions (Signed)
Joint protection principles and AE review for pt to use at home   to modify act to avoid lat grip on R and sustained or tight grip  Modify to avoid ulnar deviation during act  Tendon glides AROM  RD of digits  Opposition to all digits  AROM  Can do paraffin if pain or stiffness

## 2016-08-13 NOTE — Therapy (Signed)
[facility] [address], [place], [address] Phone: [phone]   Fax:  [phone]  Occupational Therapy Evaluation  Patient Details  Name: Stephanie Patel MRN: [id number] Date of Birth: 1945-07-08 Referring Provider: Jefm Bryant   Encounter Date: 08/13/2016      OT End of Session - 08/13/16 1913    Visit Number 1   Number of Visits 1   Date for OT Re-Evaluation 08/13/16   OT Start Time 1425   OT Stop Time 1524   OT Time Calculation (min) 59 min   Activity Tolerance Patient tolerated treatment well   Behavior During Therapy [facility] for tasks assessed/performed      Past Medical History:  Diagnosis Date  . IBS (irritable bowel syndrome)   . IBS (irritable bowel syndrome)   . LVH (left ventricular hypertrophy)   . Rheumatoid arthritis [facility])     Past Surgical History:  Procedure Laterality Date  . ABDOMINAL HYSTERECTOMY    . BREAST CYST ASPIRATION Left    neg    There were no vitals filed for this visit.      Subjective Assessment - 08/13/16 1906    Subjective  I have been on taper for while - did not start Humara - just finish today my antibiotic for lungs- not really pain in my hands - I just do not want them to get worse and more stiffness in the am and hard time doing some fine motor things    Patient Stated Goals I just don't want my fingers and hands to get worse - able to do jewelry , buttons , open jars, open packages    Currently in Pain? 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, hospital, R hand dominant - likes to travel , read on IPAD, do stain glass one time week - has little dog      Strength   Right Hand Grip (lbs) 38   Right Hand Lateral Pinch 5 lbs   Right  Hand 3 Point Pinch 10 lbs   Left Hand Grip (lbs) 39   Left Hand Lateral Pinch 7 lbs   Left Hand 3 Point Pinch 8 lbs     Left Hand AROM   L Index  MCP 0-90 75 Degrees      [place] HEP   Joint protection principles and AE review for pt to use at home   to modify act to avoid lat grip on R and sustained or tight grip  Modify to avoid ulnar deviation during act  Tendon glides AROM  RD of digits  Opposition to all digits  AROM  Can do paraffin if pain or stiffness                    OT Education - 08/13/16 1913    Education provided Yes   Education Details findings , Homeprogram    Person(s) Educated Patient   Methods Explanation;Demonstration;Tactile cues;Verbal cues   Comprehension Verbal cues required;Returned demonstration;Verbalized understanding             OT Long Term Goals - 08/13/16 0932  OT LONG TERM GOAL #1   Title Pt verbalize understanding for HEP and joint protection /AE to be use at home    Status Achieved               Plan - 08/13/16 1914    Clinical Impression Statement Pt refer with bilateral hand pain -but pt report more stiffness some AM - pt show increase arthritic changes at bilateral CMC , but no pain , decrease L 2nd MC flexion - but other wise AROM WFL - grip and 3 point grip WFL and range for age - but lateral grip decrease - pt to  show some changes at R 2nd and 3rd [facility] - putting her at risk for subluxation - pt ed on joint protection and modifications - AE use to increase her independcy and HEP - no need for further OT at this time - pt to cont with homeprogram    Occupational performance deficits (Please refer to evaluation for details): ADL's;IADL's;Leisure   Rehab Potential Fair   Current Impairments/barriers affecting progress: chronic condition   Plan discharge with HEP    OT Home Exercise Plan see pt instruction    Consulted and Agree with Plan of Care Patient      Patient will benefit from skilled therapeutic  intervention in order to improve the following deficits and impairments:  Impaired flexibility, Decreased strength  Visit Diagnosis: Stiffness of left hand, not elsewhere classified - Plan: Ot plan of care cert/re-cert  Stiffness of right hand, not elsewhere classified - Plan: Ot plan of care cert/re-cert  Other lack of coordination - Plan: Ot plan of care cert/re-cert    Problem List Patient Active Problem List   Diagnosis Date Noted  . HTN, goal below 140/80 12/26/2015  . ILD (interstitial lung disease) ([place]) 03/28/2015  . Acute respiratory failure with hypoxia ([place]) 03/18/2015  . Closed torus fracture of distal end of right fibula 10/03/2013  . Depression 08/21/2013  . Environmental allergies 08/21/2013  . Irritable bowel syndrome 08/21/2013  . LVH (left ventricular hypertrophy) 08/21/2013  . Osteoarthritis of knee 08/21/2013  . Vitamin D deficiency, unspecified 08/21/2013  . Rheumatoid arthritis of multiple sites with negative rheumatoid factor ([place]) 02/10/2012    Rosalyn Gess OTR/L,CLT 08/13/2016, 7:21 PM  [facility] [address], [place], [address] Phone: [phone]   Fax:  [phone]  Name: Stephanie Patel MRN: [id number] Date of Birth: June 05, 1945

## 2016-08-22 ENCOUNTER — Other Ambulatory Visit: Payer: Self-pay | Admitting: Pulmonary Disease

## 2016-09-01 ENCOUNTER — Encounter: Payer: Self-pay | Admitting: Pulmonary Disease

## 2016-09-01 ENCOUNTER — Ambulatory Visit (INDEPENDENT_AMBULATORY_CARE_PROVIDER_SITE_OTHER): Payer: Medicare Other | Admitting: Pulmonary Disease

## 2016-09-01 VITALS — BP 130/90 | HR 88 | Resp 16 | Ht 60.0 in | Wt 125.0 lb

## 2016-09-01 DIAGNOSIS — J8410 Pulmonary fibrosis, unspecified: Secondary | ICD-10-CM

## 2016-09-01 DIAGNOSIS — R059 Cough, unspecified: Secondary | ICD-10-CM

## 2016-09-01 DIAGNOSIS — R05 Cough: Secondary | ICD-10-CM

## 2016-09-01 DIAGNOSIS — M0510 Rheumatoid lung disease with rheumatoid arthritis of unspecified site: Secondary | ICD-10-CM | POA: Diagnosis not present

## 2016-09-01 DIAGNOSIS — G4734 Idiopathic sleep related nonobstructive alveolar hypoventilation: Secondary | ICD-10-CM

## 2016-09-01 NOTE — Patient Instructions (Signed)
Continue current medications. No changes made today  Follow-up in November of this year with lung function tests and chest x-ray prior

## 2016-09-02 ENCOUNTER — Encounter: Payer: Self-pay | Admitting: General Surgery

## 2016-09-03 NOTE — Progress Notes (Signed)
PULMONARY OFFICE FOLLOW UP NOTE  PROBLEMS: Rheumatoid arthritis Hospitalized 3/13 - 03/25/15 with acute (?on chronic) hypoxemic respiratory failure and severe pneumonitis pattern on CXR, CT chest.  Treated with empiric antibiotics and systemic steroids with gradual improvement  Discharged home on tapering prednisone, home O2 Pulmonary fibrosis - previously on MTX 03/18/15 CT chest: Chronic pulmonary fibrosis, bibasilar predominant, likely related to the given history of rheumatoid arthritis. Diffuse patchy ground-glass opacities throughout both lungs. 05/07/15 CT chest: Persistence of fibrotic changes. Resolution of diffuse GGOs 05/08/15 PFT: Mild restriction, mod decrease DLCO. Minimal desaturation on 6 min walk (to 92%) 05/16/15 Overnight oximetry: relatively brief period of desaturation but to a low of 68% 11/13/15 CXR: mild interstitial prominence. NAD 11/13/15 PFTs: no obstruction, mild restriction, DLCO measurement invalid  08/12/16 6MWT: 300 meters, SpO2 98>91%  INTERVAL HISTORY: Seen by Nance Pear 07/30 for nasal congestion and cough. Tessalon initiated  SUBJ: Overall better. Cough is improved. She still has some mild NP cough but it is tolerable. Dyspnea is unchanged and mild. Denies CP, fever, purulent sputum, hemoptysis, LE edema and calf tenderness. She has been started on Humira for RA with improvement in arthritis symptoms.    OBJ: Vitals:   09/01/16 1051 09/01/16 1056  BP:  130/90  Pulse:  88  Resp: 16   SpO2:  97%  Weight: 56.7 kg (125 lb)   Height: 5' (1.524 m)     Gen: NAD HEENT: Mild rhinitis  Neck: No LAN, no JVD noted Lungs: full BS, normal percussion note throughout, diffuse B crackles, no wheezes Cardiovascular: RRR, no M noted Abdomen: Soft, NT +BS Ext: no C/C/E. + RA changes in B hands   DATA: No new CXR  IMPRESSION: Pulmoanry fibrosis due to rheumatoid lung Nocturnal hypoxemia Chronic cough - controlled with tessalon perle   PLAN: Continue current  medications. No changes made today Follow-up in November of this year with lung function tests and chest x-ray prior  Merton Border, MD PCCM service Mobile [phone] Pager [phone] 09/03/2016 4:10 AM

## 2016-09-17 ENCOUNTER — Ambulatory Visit: Payer: Self-pay | Admitting: General Surgery

## 2016-09-29 ENCOUNTER — Ambulatory Visit (INDEPENDENT_AMBULATORY_CARE_PROVIDER_SITE_OTHER): Payer: Medicare Other | Admitting: General Surgery

## 2016-09-29 ENCOUNTER — Encounter: Payer: Self-pay | Admitting: General Surgery

## 2016-09-29 VITALS — BP 118/68 | HR 82 | Resp 12 | Ht 60.0 in | Wt 126.0 lb

## 2016-09-29 DIAGNOSIS — K4090 Unilateral inguinal hernia, without obstruction or gangrene, not specified as recurrent: Secondary | ICD-10-CM | POA: Insufficient documentation

## 2016-09-29 NOTE — Progress Notes (Signed)
Patient ID: Stephanie Patel, female   DOB: 1945-08-06, 71 y.o.   MRN: [id number]  Chief Complaint  Patient presents with  . Other    HPI Stephanie Patel is a 71 y.o. female here today for a evaluation of a right inguinal hernia . Patient noticed this area about a month ago. She states the area pops in and out. No change in size or pain. Moves her bowels daily. Her husband passed about a year ago, Prior to his death she was doing more manual lifting to assist him getting out of bed and she had in the past. Since that time she's noted the area in the right groin to be more symptomatic.Marland Kitchen   HPI  Past Medical History:  Diagnosis Date  . Depression   . IBS (irritable bowel syndrome)   . LVH (left ventricular hypertrophy)   . Rheumatoid arthritis [facility])     Past Surgical History:  Procedure Laterality Date  . ABDOMINAL HYSTERECTOMY    . APPENDECTOMY    . BREAST CYST ASPIRATION Left    neg  . COLONOSCOPY      Family History  Problem Relation Age of Onset  . Alzheimer's disease Mother   . Heart disease Father   . Mitral valve prolapse Father   . Colon polyps Brother     Social History Social History  Substance Use Topics  . Smoking status: Former Smoker    Packs/day: 0.50    Quit date: 06/05/1976  . Smokeless tobacco: Never Used  . Alcohol use Yes     Comment: Wine occasional    Allergies  Allergen Reactions  . Codeine Other (See Comments)  . Keflex [Cephalexin] Hives    Current Outpatient Prescriptions  Medication Sig Dispense Refill  . acidophilus (RISAQUAD) CAPS capsule Take 1 capsule by mouth daily.    . Adalimumab (HUMIRA) 40 MG/0.4ML PSKT Inject into the skin.    . benzonatate (TESSALON) 200 MG capsule take 1 capsule by mouth three times a day if needed for cough 30 capsule 1  . buPROPion (WELLBUTRIN XL) 300 MG 24 hr tablet Take 300 mg by mouth daily.    . busPIRone (BUSPAR) 15 MG tablet Take 15 mg by mouth 2 (two) times daily.    . Calcium Carbonate-Vit D-Min  (CALCIUM 1200 PO) Take 1 tablet by mouth at bedtime.     . cetirizine (ZYRTEC ALLERGY) 10 MG tablet Take 1 tablet (10 mg total) by mouth daily. 30 tablet 6  . conjugated estrogens (PREMARIN) vaginal cream Place 0.625 mg vaginally as needed.    . desvenlafaxine (PRISTIQ) 50 MG 24 hr tablet Take 50 mg by mouth daily.    Marland Kitchen dicyclomine (BENTYL) 20 MG tablet Take 20 mg by mouth 3 (three) times daily.  0  . feeding supplement, ENSURE ENLIVE, (ENSURE ENLIVE) LIQD Take 237 mLs by mouth 2 (two) times daily between meals. 237 mL 12  . fluticasone (FLONASE) 50 MCG/ACT nasal spray Place 2 sprays into both nostrils daily. 16 g 10  . hydroxychloroquine (PLAQUENIL) 200 MG tablet Take 200 mg by mouth daily.  0  . leflunomide (ARAVA) 20 MG tablet   0  . omeprazole (PRILOSEC) 40 MG capsule Take 40 mg by mouth daily.  0  . OXYGEN Inhale into the lungs.    . tolterodine (DETROL LA) 4 MG 24 hr capsule Take 4 mg by mouth daily.    . traZODone (DESYREL) 50 MG tablet take 1 tablet by mouth at bedtime if needed  30 tablet 1  . valACYclovir (VALTREX) 500 MG tablet Take 500 mg by mouth every Monday, Wednesday, and Friday.     . Abatacept (ORENCIA [place]) Inject 125 mg/mL into the skin every 14 (fourteen) days.      No current facility-administered medications for this visit.     Review of Systems Review of Systems  Constitutional: Negative.   Respiratory: Negative.   Cardiovascular: Negative.     Blood pressure 118/68, pulse 82, resp. rate 12, height 5' (1.524 m), weight 126 lb (57.2 kg).  Physical Exam Physical Exam  Constitutional: She is oriented to person, place, and time. She appears well-developed and well-nourished.  Cardiovascular: Normal rate and regular rhythm.   Murmur heard.  Systolic murmur is present with a grade of 1/6  Pulmonary/Chest: Effort normal and breath sounds normal.  Abdominal: Soft. Normal appearance. There is no hepatosplenomegaly. There is no tenderness. A hernia is present. Hernia  confirmed positive in the right inguinal area.    Lymphadenopathy:       Right: No inguinal adenopathy present.       Left: No inguinal adenopathy present.  Neurological: She is alert and oriented to person, place, and time.  Skin: Skin is warm and dry.    Data Reviewed CBC dated 06/23/2016 showed a hemoglobin of 12.2 with an MCV of 96, white blood cell count of 5200. Differential notable for increased monocyte level at 14.6%. Unchanged over the past 12 months. Neutrophils 41%, lymphocytes 39%. On principal metabolic panel notable only for a mild elevation of the serum glucose of 112. Sedimentation rate: 11.  Hemoccult negative. (June 2017)  Assessment    Symptomatic right inguinal hernia.  Biologics for management of rheumatoid arthritis.    Plan     Hernia precautions and incarceration were discussed with the patient. If they develop symptoms of an incarcerated hernia, they were encouraged to seek prompt medical attention.  I have recommended repair of the hernia using mesh on an outpatient basis in the near future. The risk of infection was reviewed. The possible increased risk of infection due to her past exposure to biologic agents and methotrexate for her rheumatoid arthritis were reviewed.  The role of prosthetic mesh to minimize the risk of recurrence was reviewed.  I spoke with Cheral Almas, M.D. the patient's rheumatologist. We recommended that she be off the Biologics for 2 weeks and to not restart until 2 weeks after surgery.  HPI, Physical Exam, Assessment and Plan have been scribed under the direction and in the presence of Hervey Ard, MD.  Gaspar Cola, CMA  The patient is scheduled for surgery at [facility] on 10/06/16. She is aware to hold her Humira injection for two weeks after surgery. She will pre admit by phone. The patient is aware of date and instructions.  Documented by Lesly Rubenstein LPN   I have completed the exam and reviewed the above  documentation for accuracy and completeness.  I agree with the above.  Haematologist has been used and any errors in dictation or transcription are unintentional.  Hervey Ard, M.D., F.A.C.S.  Robert Bellow 09/29/2016, 10:01 PM

## 2016-09-29 NOTE — Patient Instructions (Addendum)

## 2016-10-02 ENCOUNTER — Encounter
Admission: RE | Admit: 2016-10-02 | Discharge: 2016-10-02 | Disposition: A | Discharge status: Home / Self Care | Payer: Medicare Other | Source: Ambulatory Visit | Attending: General Surgery | Admitting: General Surgery

## 2016-10-02 ENCOUNTER — Other Ambulatory Visit: Payer: Self-pay | Admitting: Pulmonary Disease

## 2016-10-02 DIAGNOSIS — Z01810 Encounter for preprocedural cardiovascular examination: Secondary | ICD-10-CM | POA: Insufficient documentation

## 2016-10-02 HISTORY — DX: Malignant (primary) neoplasm, unspecified: C80.1

## 2016-10-02 HISTORY — DX: Cardiac murmur, unspecified: R01.1

## 2016-10-02 HISTORY — DX: Gastro-esophageal reflux disease without esophagitis: K21.9

## 2016-10-02 HISTORY — DX: Pulmonary fibrosis, unspecified: J84.10

## 2016-10-02 HISTORY — DX: Cough: R05

## 2016-10-02 HISTORY — DX: Chronic cough: R05.3

## 2016-10-02 NOTE — Pre-Procedure Instructions (Signed)
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 No edema or consolidation. Heart is upper normal in size with pulmonary vascularity within normal limits. There is aortic atherosclerosis. No adenopathy. There is thoracolumbar levoscoliosis.  IMPRESSION: Stable pulmonary fibrosis. No edema or consolidation. Stable cardiac silhouette. No evident adenopathy. There is aortic atherosclerosis.   Electronically Signed   By: Lowella Grip III M.D.   On: 05/18/2016 13:57   Order-Level Documents:   There are no order-level documents.  Encounter-Level Documents:   There are no encounter-level documents.  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 Pt does have a cough which is due to allergies.  History of left ventricular hypertrophy.  shielded    Original Order   Ordered On Ordered By   05/18/2016 11:46 AM Wilhelmina Mcardle, MD

## 2016-10-02 NOTE — Pre-Procedure Instructions (Signed)
Progress Notes Encounter Date: 09/01/2016 Wilhelmina Mcardle, MD  Pulmonology    [] Hide copied text PULMONARY OFFICE FOLLOW UP NOTE  PROBLEMS: Rheumatoid arthritis Hospitalized 3/13 - 03/25/15 with acute (?on chronic) hypoxemic respiratory failure and severe pneumonitis pattern on CXR, CT chest.             Treated with empiric antibiotics and systemic steroids with gradual improvement             Discharged home on tapering prednisone, home O2 Pulmonary fibrosis - previously on MTX 03/18/15 CT chest: Chronic pulmonary fibrosis, bibasilar predominant, likely related to the given history of rheumatoid arthritis. Diffuse patchy ground-glass opacities throughout both lungs. 05/07/15 CT chest: Persistence of fibrotic changes. Resolution of diffuse GGOs 05/08/15 PFT: Mild restriction, mod decrease DLCO. Minimal desaturation on 6 min walk (to 92%) 05/16/15 Overnight oximetry: relatively brief period of desaturation but to a low of 68% 11/13/15 CXR: mild interstitial prominence. NAD 11/13/15 PFTs: no obstruction, mild restriction, DLCO measurement invalid  08/12/16 6MWT: 300 meters, SpO2 98>91%  INTERVAL HISTORY: Seen by Nance Pear 07/30 for nasal congestion and cough. Tessalon initiated  SUBJ: Overall better. Cough is improved. She still has some mild NP cough but it is tolerable. Dyspnea is unchanged and mild. Denies CP, fever, purulent sputum, hemoptysis, LE edema and calf tenderness. She has been started on Humira for RA with improvement in arthritis symptoms.    OBJ:     Vitals:   09/01/16 1051 09/01/16 1056  BP:  130/90  Pulse:  88  Resp: 16   SpO2:  97%  Weight: 56.7 kg (125 lb)   Height: 5' (1.524 m)     Gen: NAD HEENT: Mild rhinitis  Neck: No LAN, no JVD noted Lungs: full BS, normal percussion note throughout, diffuse B crackles, no wheezes Cardiovascular: RRR, no M noted Abdomen: Soft, NT +BS Ext: no C/C/E. 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 No changes made today Follow-up in November of this year with lung function tests and chest x-ray prior  Merton Border, MD PCCM service Mobile [phone] Pager [phone] 09/03/2016 4:10 AM      Electronically signed by Wilhelmina Mcardle, MD at 09/03/2016 4:10 AM      Office Visit on 09/01/2016        Detailed Report

## 2016-10-02 NOTE — Pre-Procedure Instructions (Signed)
[facility]  ECHO COMPLETE WO IMAGING ENHANCING AGENT  Order# [id number]  Reading physician: Isaias Cowman, MD Ordering physician: Henreitta Leber, MD Study date: 03/19/15  Study Result   Result status: Final result                   *[place], [place] [address]                            [phone]  ------------------------------------------------------------------- Transthoracic Echocardiography  Patient:    Stephanie Patel, Stephanie Patel MR #:       [id number] Study Date: 03/19/2015 Gender:     F Age:        71 Height:     162.6 cm Weight:     59.9 kg BSA:        1.65 m^2 Pt. Status: Room:       219A   ADMITTING    Leonarda Salon J  ATTENDING    Hower, Aaron Mose  SONOGRAPHER  Sonia Side Hege RDCS  PERFORMING   [facility]  cc:  ------------------------------------------------------------------- LV EF: 60% -   65%  ------------------------------------------------------------------- Indications:      Dyspnea 786.09.  ------------------------------------------------------------------- History:   PMH:  LVH, IBS, rheumatoid arthritis  ------------------------------------------------------------------- Study Conclusions  - Left ventricle: Systolic function was normal. The estimated   ejection fraction was in the range of 60% to 65%. - Aortic valve: Valve area (Vmax): 2.26 cm^2. - Mitral valve: There was mild regurgitation.  Transthoracic echocardiography.  M-mode, complete 2D, spectral Doppler, and color Doppler.  Birthdate:  Patient birthdate: 03/11/1945.  Age:  Patient is 71 yr old.  Sex:  Gender: female. BMI: 22.6 kg/m^2.  Blood pressure:     119/72  Patient status: Inpatient.  Study date:  Study date: 03/19/2015. Study time:  09:44 AM.  -------------------------------------------------------------------  ------------------------------------------------------------------- Left ventricle:  Systolic function was normal. The estimated ejection fraction was in the range of 60% to 65%.  ------------------------------------------------------------------- Aortic valve:   Structurally normal valve.   Cusp separation was normal.  Doppler:  Transvalvular velocity was within the normal range. There was no stenosis. There was no regurgitation.    Peak velocity ratio of LVOT to aortic valve: 0.72. Valve area (Vmax): 2.26 cm^2. Indexed valve area (Vmax): 1.37 cm^2/m^2.    Peak gradient (S): 6 mm Hg.  ------------------------------------------------------------------- Aorta:  The aorta was normal, not dilated, and non-diseased.  ------------------------------------------------------------------- Mitral valve:   Doppler:  There was mild regurgitation.  ------------------------------------------------------------------- Left atrium:  The atrium was at the upper limits of normal in size.   ------------------------------------------------------------------- Tricuspid valve:   Doppler:  There was mild regurgitation.  ------------------------------------------------------------------- Right atrium:  The atrium was at the upper limits of normal in size.  ------------------------------------------------------------------- Pericardium:  The pericardium was normal in appearance. There was no pericardial effusion.  ------------------------------------------------------------------- Post procedure conclusions Ascending Aorta:  - The aorta was normal, not dilated, and non-diseased.  ------------------------------------------------------------------- Measurements   Left ventricle  Value          Reference  LV ID, ED, PLAX chordal           (L)     39.7  mm       43 - 52  LV ID, ES,  PLAX chordal                   26.3  mm       23 - 38  LV fx shortening, PLAX chordal            34    %        >=29  LV PW thickness, ED                       11.9  mm       ---------  IVS/LV PW ratio, ED                       1.11           <=1.3  LV e&', lateral                            4.57  cm/s     ---------  LV E/e&', lateral                          8.6            ---------  LV e&', medial                             4.24  cm/s     ---------  LV E/e&', medial                           9.27           ---------  LV e&', average                            4.41  cm/s     ---------  LV E/e&', average                          8.92           ---------    Ventricular septum                        Value          Reference  IVS thickness, ED                         13.2  mm       ---------    LVOT                                      Value          Reference  LVOT ID, S                                20    mm       ---------  LVOT area                                 3.14  cm^2     ---------  LVOT peak velocity, S                     90.7  cm/s     ---------    Aortic valve                              Value          Reference  Aortic valve peak velocity, S             126   cm/s     ---------  Aortic peak gradient, S                   6     mm Hg    ---------  Velocity ratio, peak, LVOT/AV             0.72           ---------  Aortic valve area, peak velocity          2.26  cm^2     ---------  Aortic valve area/bsa, peak               1.37  cm^2/m^2 ---------  velocity    Aorta                                     Value          Reference  Aortic root ID, ED                        34    mm       ---------    Left atrium                               Value          Reference  LA ID, A-P, ES                            34    mm       ---------  LA ID/bsa, A-P                            2.06  cm/m^2   <=2.2  LA volume, S                              40.2  ml       ---------  LA  volume/bsa, S                          24.4  ml/m^2   ---------  LA volume, ES, 1-p A4C                    37.1  ml       ---------  LA volume/bsa, ES, 1-p A4C  22.5  ml/m^2   ---------  LA volume, ES, 1-p A2C                    43.9  ml       ---------  LA volume/bsa, ES, 1-p A2C                26.6  ml/m^2   ---------    Mitral valve                              Value          Reference  Mitral E-wave peak velocity               39.3  cm/s     ---------  Mitral A-wave peak velocity               73.7  cm/s     ---------  Mitral deceleration time          (H)     391   ms       150 - 230  Mitral E/A ratio, peak                    0.5            ---------    Right ventricle                           Value          Reference  RV ID, ED, PLAX                           26.4  mm       19 - 38  TAPSE                                     15.6  mm       ---------    Pulmonic valve                            Value          Reference  Pulmonic valve peak velocity, S           62.1  cm/s     ---------  Legend: (L)  and  (H)  mark values outside specified reference range.  ------------------------------------------------------------------- Prepared and Electronically Authenticated by  Miquel Dunn, MD 2017-03-14T12:13:49  [facility] Images   Show images for ECHO COMPLETE  Patient Information   Patient Name Stephanie Patel, Stephanie Patel Sex Female DOB May 21, 1945 SSN [id number]  Reason For Exam  Priority: Routine  Dyspnea 786.09 / R06.00  Comments: To be interpreted by [facility].  Surgical History   Surgical History   No past medical history on file.    Other Surgical History   Procedure Laterality Date Comment Source  ABDOMINAL HYSTERECTOMY    Provider  APPENDECTOMY    Provider  BREAST CYST ASPIRATION Left  neg Provider  COLONOSCOPY    Provider    Patient Data   Height 64 in    BP 119/72 mmHg       Performing Technologist/Nurse   Performing Technologist/Nurse:  Gabriel Cirri  Implants     No active implants to display in this view.  Order-Level Documents:   There are no order-level documents.  Encounter-Level Documents - 03/18/2015:   Scan on 03/26/2015 10:06 AM by Default, Provider, MD  Scan on 03/26/2015 9:36 AM by Default, Provider, MD  Scan on 03/19/2015 10:54 AM by Default, Provider, MD  Scan on 03/21/2015 9:02 AM by Default, Provider, MD  Electronic signature on 03/18/2015 4:47 PM  Electronic signature on 03/18/2015 4:47 PM      Signed   Electronically signed by Isaias Cowman, MD on 03/19/15 at 25 EDT  Printable Result Report   Result Report   External Result Report   External Result Report

## 2016-10-02 NOTE — Patient Instructions (Addendum)
Your procedure is scheduled on: 10-06-16 TUESDAY Report to Same Day Surgery 2nd floor medical mall [facility] Entrance-take elevator on left to 2nd floor.  Check in with surgery information desk.) To find out your arrival time please call [phone] between 1PM - 3PM on 10-05-16 MONDAY  Remember: Instructions that are not followed completely may result in serious medical risk, up to and including death, or upon the discretion of your surgeon and anesthesiologist your surgery may need to be rescheduled.    _x___ 1. Do not eat food after midnight the night before your procedure. NO GUM CHEWING OR HARD CANDIES.  You may drink clear liquids up to 2 hours before you are scheduled to arrive at the hospital for your procedure.  Do not drink clear liquids within 2 hours of your scheduled arrival to the hospital.  Clear liquids include  --Water or Apple juice without pulp  --Clear carbohydrate beverage such as ClearFast or Gatorade  --Black Coffee or Clear Tea (No milk, no creamers, do not add anything to the coffee or Tea) Type 1 and type 2 diabetics should only drink water. .     __x__ 2. No Alcohol for 24 hours before or after surgery.   __x__3. No Smoking for 24 prior to surgery.   ____  4. Bring all medications with you on the day of surgery if instructed.    __x__ 5. Notify your doctor if there is any change in your medical condition     (cold, fever, infections).     Do not wear jewelry, make-up, hairpins, clips or nail polish.  Do not wear lotions, powders, or perfumes. You may wear deodorant.  Do not shave 48 hours prior to surgery. Men may shave face and neck.  Do not bring valuables to the hospital.    [facility] is not responsible for any belongings or valuables.               Contacts, dentures or bridgework may not be worn into surgery.  Leave your suitcase in the car. After surgery it may be brought to your room.  For patients admitted to the hospital, discharge time is  determined by your treatment team.   Patients discharged the day of surgery will not be allowed to drive home.  You will need someone to drive you home and stay with you the night of your procedure.    Please read over the following fact sheets that you were given:      _x___ [place] WITH A SMALL SIP OF WATER. These include:  1. WELLBUTRIN (BUPROPION)  2. BUSPIRONE (BUSPAR)  3. PRISTIQ  4. PRILOSEC (OMEPRAZOLE)  5. TAKE AN EXTRA PRILOSEC ON Monday NIGHT BEFORE BED (10-05-16)  6.  ____Fleets enema or Magnesium Citrate as directed.   _x___ Use CHG Soap or sage wipes as directed on instruction sheet   ____ Use inhalers on the day of surgery and bring to hospital day of surgery  ____ Stop Metformin and Janumet 2 days prior to surgery.    ____ Take 1/2 of usual insulin dose the night before surgery and none on the morning surgery.   ____ Follow recommendations from Cardiologist, Pulmonologist or PCP regarding stopping Aspirin, Coumadin, Plavix ,Eliquis, Effient, or Pradaxa, and Pletal.  ____Stop Anti-inflammatories such as Advil, Aleve, Ibuprofen, Motrin, Naproxen, Naprosyn, Goodies powders or aspirin products. OK to take Tylenol    ____ Stop supplements until after surgery.  ____ Bring C-Pap to the hospital.

## 2016-10-05 ENCOUNTER — Encounter
Admission: RE | Admit: 2016-10-05 | Discharge: 2016-10-05 | Disposition: A | Discharge status: Home / Self Care | Payer: Medicare Other | Source: Ambulatory Visit | Attending: General Surgery | Admitting: General Surgery

## 2016-10-05 ENCOUNTER — Encounter: Payer: Self-pay | Admitting: *Deleted

## 2016-10-05 DIAGNOSIS — Z01810 Encounter for preprocedural cardiovascular examination: Secondary | ICD-10-CM | POA: Diagnosis present

## 2016-10-05 DIAGNOSIS — J849 Interstitial pulmonary disease, unspecified: Secondary | ICD-10-CM | POA: Diagnosis not present

## 2016-10-05 MED ORDER — CLINDAMYCIN PHOSPHATE 600 MG/50ML IV SOLN
600.0000 mg | Freq: Once | INTRAVENOUS | Status: AC
  Administered 2016-10-06: 600 mg via INTRAVENOUS

## 2016-10-06 ENCOUNTER — Encounter: Payer: Self-pay | Admitting: *Deleted

## 2016-10-06 ENCOUNTER — Ambulatory Visit: Payer: Medicare Other | Admitting: Anesthesiology

## 2016-10-06 ENCOUNTER — Ambulatory Visit
Admission: RE | Admit: 2016-10-06 | Discharge: 2016-10-06 | Disposition: A | Discharge status: Home / Self Care | Payer: Medicare Other | Source: Ambulatory Visit | Attending: General Surgery | Admitting: General Surgery

## 2016-10-06 ENCOUNTER — Encounter
Admission: RE | Disposition: A | Discharge status: Home / Self Care | Payer: Self-pay | Source: Ambulatory Visit | Attending: General Surgery

## 2016-10-06 DIAGNOSIS — M1990 Unspecified osteoarthritis, unspecified site: Secondary | ICD-10-CM | POA: Diagnosis not present

## 2016-10-06 DIAGNOSIS — M069 Rheumatoid arthritis, unspecified: Secondary | ICD-10-CM | POA: Insufficient documentation

## 2016-10-06 DIAGNOSIS — K4090 Unilateral inguinal hernia, without obstruction or gangrene, not specified as recurrent: Secondary | ICD-10-CM | POA: Insufficient documentation

## 2016-10-06 DIAGNOSIS — Z885 Allergy status to narcotic agent status: Secondary | ICD-10-CM | POA: Insufficient documentation

## 2016-10-06 DIAGNOSIS — Z79899 Other long term (current) drug therapy: Secondary | ICD-10-CM | POA: Diagnosis not present

## 2016-10-06 DIAGNOSIS — J449 Chronic obstructive pulmonary disease, unspecified: Secondary | ICD-10-CM | POA: Insufficient documentation

## 2016-10-06 DIAGNOSIS — Z87891 Personal history of nicotine dependence: Secondary | ICD-10-CM | POA: Diagnosis not present

## 2016-10-06 HISTORY — PX: INGUINAL HERNIA REPAIR: SHX194

## 2016-10-06 HISTORY — DX: Idiopathic sleep related nonobstructive alveolar hypoventilation: G47.34

## 2016-10-06 SURGERY — REPAIR, HERNIA, INGUINAL, ADULT
Anesthesia: General | Laterality: Right | Wound class: Clean

## 2016-10-06 MED ORDER — PROMETHAZINE HCL 12.5 MG PO TABS: 12.5000 mg | ORAL_TABLET | ORAL | 0 refills | Status: DC | PRN

## 2016-10-06 MED ORDER — KETOROLAC TROMETHAMINE 30 MG/ML IJ SOLN
INTRAMUSCULAR | Status: DC | PRN
  Administered 2016-10-06: 30 mg

## 2016-10-06 MED ORDER — BUPIVACAINE-EPINEPHRINE (PF) 0.5% -1:200000 IJ SOLN
INTRAMUSCULAR | Status: DC | PRN
  Administered 2016-10-06: 10 mL via PERINEURAL
  Administered 2016-10-06: 20 mL via PERINEURAL

## 2016-10-06 MED ORDER — FENTANYL CITRATE (PF) 100 MCG/2ML IJ SOLN
INTRAMUSCULAR | Status: AC
  Filled 2016-10-06: qty 2

## 2016-10-06 MED ORDER — DEXAMETHASONE SODIUM PHOSPHATE 10 MG/ML IJ SOLN
INTRAMUSCULAR | Status: DC | PRN
  Administered 2016-10-06: 10 mg via INTRAVENOUS

## 2016-10-06 MED ORDER — PROPOFOL 10 MG/ML IV BOLUS
INTRAVENOUS | Status: DC | PRN
  Administered 2016-10-06: 120 mg via INTRAVENOUS

## 2016-10-06 MED ORDER — BUPIVACAINE-EPINEPHRINE (PF) 0.5% -1:200000 IJ SOLN
INTRAMUSCULAR | Status: AC
  Filled 2016-10-06: qty 30

## 2016-10-06 MED ORDER — FENTANYL CITRATE (PF) 100 MCG/2ML IJ SOLN: 25.0000 ug | INTRAMUSCULAR | Status: DC | PRN

## 2016-10-06 MED ORDER — ONDANSETRON HCL 4 MG/2ML IJ SOLN
INTRAMUSCULAR | Status: AC
  Filled 2016-10-06: qty 2

## 2016-10-06 MED ORDER — HYDROCODONE-ACETAMINOPHEN 5-325 MG PO TABS: 1.0000 | ORAL_TABLET | ORAL | 0 refills | Status: DC | PRN

## 2016-10-06 MED ORDER — DEXAMETHASONE SODIUM PHOSPHATE 10 MG/ML IJ SOLN
INTRAMUSCULAR | Status: AC
  Filled 2016-10-06: qty 1

## 2016-10-06 MED ORDER — LACTATED RINGERS IV SOLN
INTRAVENOUS | Status: DC
  Administered 2016-10-06: 08:00:00 via INTRAVENOUS

## 2016-10-06 MED ORDER — ONDANSETRON HCL 4 MG/2ML IJ SOLN
INTRAMUSCULAR | Status: DC | PRN
  Administered 2016-10-06: 4 mg via INTRAVENOUS

## 2016-10-06 MED ORDER — PROPOFOL 10 MG/ML IV BOLUS
INTRAVENOUS | Status: AC
  Filled 2016-10-06: qty 20

## 2016-10-06 MED ORDER — FENTANYL CITRATE (PF) 100 MCG/2ML IJ SOLN
INTRAMUSCULAR | Status: DC | PRN
  Administered 2016-10-06 (×2): 25 ug via INTRAVENOUS
  Administered 2016-10-06: 50 ug via INTRAVENOUS

## 2016-10-06 MED ORDER — CLINDAMYCIN PHOSPHATE 600 MG/50ML IV SOLN
INTRAVENOUS | Status: AC
  Filled 2016-10-06: qty 50

## 2016-10-06 MED ORDER — KETOROLAC TROMETHAMINE 30 MG/ML IJ SOLN
INTRAMUSCULAR | Status: AC
  Filled 2016-10-06: qty 1

## 2016-10-06 SURGICAL SUPPLY — 34 items
BLADE SURG 15 STRL SS SAFETY (BLADE) ×6 IMPLANT
CANISTER SUCT 1200ML W/VALVE (MISCELLANEOUS) ×3 IMPLANT
CHLORAPREP W/TINT 26ML (MISCELLANEOUS) ×3 IMPLANT
CLOSURE WOUND 1/2 X4 (GAUZE/BANDAGES/DRESSINGS) ×1
DECANTER SPIKE VIAL GLASS SM (MISCELLANEOUS) ×3 IMPLANT
DRAIN PENROSE 1/4X12 LTX (DRAIN) ×3 IMPLANT
DRAPE LAPAROTOMY 100X77 ABD (DRAPES) ×3 IMPLANT
DRSG TEGADERM 4X4.75 (GAUZE/BANDAGES/DRESSINGS) ×3 IMPLANT
DRSG TELFA 4X3 1S NADH ST (GAUZE/BANDAGES/DRESSINGS) ×3 IMPLANT
ELECT REM PT RETURN 9FT ADLT (ELECTROSURGICAL) ×3
ELECTRODE REM PT RTRN 9FT ADLT (ELECTROSURGICAL) ×1 IMPLANT
GLOVE BIO SURGEON STRL SZ7.5 (GLOVE) ×6 IMPLANT
GLOVE INDICATOR 8.0 STRL GRN (GLOVE) ×6 IMPLANT
GOWN STRL REUS W/ TWL LRG LVL3 (GOWN DISPOSABLE) ×2 IMPLANT
GOWN STRL REUS W/TWL LRG LVL3 (GOWN DISPOSABLE) ×4
KIT RM TURNOVER STRD PROC AR (KITS) ×3 IMPLANT
LABEL OR SOLS (LABEL) ×3 IMPLANT
MESH HERNIA 6X12 ULTRAPRO MED (Mesh General) ×1 IMPLANT
MESH HERNIA ULTRAPRO MED (Mesh General) ×2 IMPLANT
NDL SAFETY 22GX1.5 (NEEDLE) ×6 IMPLANT
NEEDLE HYPO 25X1 1.5 SAFETY (NEEDLE) ×3 IMPLANT
PACK BASIN MINOR ARMC (MISCELLANEOUS) ×3 IMPLANT
STRIP CLOSURE SKIN 1/2X4 (GAUZE/BANDAGES/DRESSINGS) ×2 IMPLANT
SUT SURGILON 0 BLK (SUTURE) ×6 IMPLANT
SUT VIC AB 2-0 SH 27 (SUTURE) ×2
SUT VIC AB 2-0 SH 27XBRD (SUTURE) ×1 IMPLANT
SUT VIC AB 3-0 54X BRD REEL (SUTURE) ×1 IMPLANT
SUT VIC AB 3-0 BRD 54 (SUTURE) ×2
SUT VIC AB 3-0 SH 27 (SUTURE) ×2
SUT VIC AB 3-0 SH 27X BRD (SUTURE) ×1 IMPLANT
SUT VIC AB 4-0 FS2 27 (SUTURE) ×3 IMPLANT
SWABSTK COMLB BENZOIN TINCTURE (MISCELLANEOUS) ×3 IMPLANT
SYR 3ML LL SCALE MARK (SYRINGE) ×3 IMPLANT
SYR CONTROL 10ML (SYRINGE) ×6 IMPLANT

## 2016-10-06 NOTE — Discharge Instructions (Signed)

## 2016-10-06 NOTE — Progress Notes (Signed)
Applied ice pack

## 2016-10-06 NOTE — Anesthesia Procedure Notes (Signed)
Procedure Name: LMA Insertion Date/Time: 10/06/2016 8:51 AM Performed by: Jonna Clark Pre-anesthesia Checklist: Patient identified, Patient being monitored, Timeout performed, Emergency Drugs available and Suction available Patient Re-evaluated:Patient Re-evaluated prior to induction Oxygen Delivery Method: Circle system utilized Preoxygenation: Pre-oxygenation with 100% oxygen Induction Type: IV induction Ventilation: Mask ventilation without difficulty LMA: LMA inserted LMA Size: 3.5 Tube type: Oral Number of attempts: 1 Placement Confirmation: positive ETCO2 and breath sounds checked- equal and bilateral Tube secured with: Tape Dental Injury: Teeth and Oropharynx as per pre-operative assessment

## 2016-10-06 NOTE — H&P (Signed)
No change in clinical history or exam. For right inguinal hernia repair.  

## 2016-10-06 NOTE — Progress Notes (Signed)
Fighting upon arrival to pacu  Trying to get oob   Very restless  Dr Amie Critchley in too see pt

## 2016-10-06 NOTE — Transfer of Care (Signed)
Immediate Anesthesia Transfer of Care Note  Patient: Stephanie Patel  Procedure(s) Performed: HERNIA REPAIR INGUINAL ADULT WITH MESH (Right )  Patient Location: PACU  Anesthesia Type:General  Level of Consciousness: awake and patient cooperative  Airway & Oxygen Therapy: Patient Spontanous Breathing and Patient connected to face mask oxygen  Post-op Assessment: Report given to RN and Post -op Vital signs reviewed and stable  Post vital signs: Reviewed and stable  Last Vitals:  Vitals:   10/06/16 0803 10/06/16 0953  BP: 121/87 (!) 167/84  Pulse: 87 78  Resp: 16 18  Temp: (!) 35.7 C   SpO2: 97% 100%    Last Pain:  Vitals:   10/06/16 0803  TempSrc: Tympanic         Complications: No apparent anesthesia complications

## 2016-10-06 NOTE — Anesthesia Preprocedure Evaluation (Signed)
Anesthesia Evaluation  Patient identified by MRN, date of birth, ID band Patient awake    Reviewed: Allergy & Precautions, H&P , NPO status , Patient's Chart, lab work & pertinent test results  History of Anesthesia Complications Negative for: history of anesthetic complications  Airway Mallampati: III  TM Distance: <3 FB Neck ROM: limited    Dental  (+) Poor Dentition, Chipped, Missing   Pulmonary shortness of breath and with exertion, COPD, former smoker,           Cardiovascular Exercise Tolerance: Good hypertension, (-) angina(-) Past MI and (-) DOE + Valvular Problems/Murmurs      Neuro/Psych PSYCHIATRIC DISORDERS negative neurological ROS     GI/Hepatic Neg liver ROS, GERD  Medicated and Controlled,  Endo/Other  negative endocrine ROS  Renal/GU      Musculoskeletal  (+) Arthritis ,   Abdominal   Peds  Hematology negative hematology ROS (+)   Anesthesia Other Findings Past Medical History: No date: 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 No date: Chronic cough     Comment:  USES TESSALON PEARLS PRN-NEVER PRODUCTIVE COUGH, ONLY               DRY No date: Depression No date: GERD (gastroesophageal reflux disease) No date: Heart murmur No date: IBS (irritable bowel syndrome) No date: LVH (left ventricular hypertrophy) No date: Nocturnal hypoxemia     Comment:  ON O2 @ 2 LITERS [place] ONLY AT NIGHT No date: 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 No date: Rheumatoid arthritis (HCC)     Comment:  RA  Past Surgical History: No date: ABDOMINAL HYSTERECTOMY No date: APPENDECTOMY No date: BREAST CYST ASPIRATION; Left     Comment:  neg No date: COLONOSCOPY  BMI    Body Mass Index:  24.61 kg/m      Reproductive/Obstetrics negative OB ROS                             Anesthesia Physical Anesthesia Plan  ASA: III  Anesthesia Plan: General LMA    Post-op Pain Management:    Induction: Intravenous  PONV Risk Score and Plan: 3 and Ondansetron, Dexamethasone and Treatment may vary due to age or medical condition  Airway Management Planned: LMA  Additional Equipment:   Intra-op Plan:   Post-operative Plan: Extubation in OR  Informed Consent: I have reviewed the patients History and Physical, chart, labs and discussed the procedure including the risks, benefits and alternatives for the proposed anesthesia with the patient or authorized representative who has indicated his/her understanding and acceptance.   Dental Advisory Given  Plan Discussed with: Anesthesiologist, CRNA and Surgeon  Anesthesia Plan Comments: (Patient consented for risks of anesthesia including but not limited to:  - adverse reactions to medications - damage to teeth, lips or other oral mucosa - sore throat or hoarseness - Damage to heart, brain, lungs or loss of life  Patient voiced understanding.)        Anesthesia Quick Evaluation

## 2016-10-06 NOTE — Op Note (Signed)
Preoperative diagnosis: Right inguinal hernia.  Postoperative diagnosis: Same.  Operative procedure: Repair of right indirect inguinal hernia with medium Ultra Pro mesh.  Operating surgeon: Loa Socks, M.D.  Anesthesia: Gen. by LMA, Marcaine 0.5% with 1-200,000 units of epinephrine, 30 cc; Toradol 30 mg.  Estimated blood loss: Less than 5 cc.  Clinical note: This 71 year old woman has developed a symptomatic right inguinal hernia and was admitted for elective repair. She received Kefzol prior to procedure. Hair removed from the surgical site with clippers.  Operative note: With the patient under adequate general anesthesia the area was cleansed with ChloraPrep and draped. Marcaine was infiltrated as field block anesthesia. A 5 mm incision along the anticipated course the inguinal canal was carried down through skin and subcutaneous tissue with hemostasis achieved by electrocautery and 3-0 Vicryl ties. The external oblique was opened in the direction of its fibers. A lipoma-like mass was excised and discarded from the structures associated with the broad ligament. The broad ligament was divided between 3-0 Vicryl ties. The hernia sac was mobilized and the pre-peritoneal space and the undersurface of the fascia cleared circumferentially. A medium ultra Pro mesh was smoothed and position and the external component laid along the floor of the inguinal canal. This was anchored to the pubic tubercle with a 0 Surgilon suture. Interrupted sutures were used between the inferior edge of the mesh and the inguinal ligament as well as the medial and superior borders in the transverse abdominis aponeurosis. The ileal inguinal nerve was identified and protected. The iliohypogastric nerve was not seen. Toradol was placed in the wound and the external oblique closed with a running 2-0 Vicryl suture. Scarpa's fascia was closed with a running 3-0 Vicryl suture and the skin closed with a running 3-0 Vicryl  subcutaneous suture. Benzoin, Steri-Strips, Telfa dressing was applied.  The patient tolerated the procedure well and was taken recovery room in stable condition.

## 2016-10-06 NOTE — Anesthesia Post-op Follow-up Note (Signed)
Anesthesia QCDR form completed.        

## 2016-10-06 NOTE — Anesthesia Postprocedure Evaluation (Signed)
Anesthesia Post Note  Patient: PEACE NOYES  Procedure(s) Performed: HERNIA REPAIR INGUINAL ADULT WITH MESH (Right )  Patient location during evaluation: PACU Anesthesia Type: General Level of consciousness: awake and alert Pain management: pain level controlled Vital Signs Assessment: post-procedure vital signs reviewed and stable Respiratory status: spontaneous breathing, nonlabored ventilation, respiratory function stable and patient connected to nasal cannula oxygen Cardiovascular status: blood pressure returned to baseline and stable Postop Assessment: no apparent nausea or vomiting Anesthetic complications: no     Last Vitals:  Vitals:   10/06/16 1044 10/06/16 1058  BP: (!) 141/76 (!) 141/77  Pulse: 67 72  Resp: 16 16  Temp: 37.1 C 36.9 C  SpO2: 97% 99%    Last Pain:  Vitals:   10/06/16 1058  TempSrc: Temporal                 Precious Haws Piscitello

## 2016-10-07 ENCOUNTER — Telehealth: Payer: Self-pay | Admitting: *Deleted

## 2016-10-07 NOTE — Telephone Encounter (Signed)
She called asking about bleeding on her bandage from yesterday hernia surgery. She states it is a small amount just more than yesterday. Denies any swelling to the area states it is not saturated. Aware to use ice today and to call back if bleeding becomes more noticeable or she has concerns. Aware she can change bandage if necessary. Pt agrees.

## 2016-10-13 ENCOUNTER — Ambulatory Visit (INDEPENDENT_AMBULATORY_CARE_PROVIDER_SITE_OTHER): Payer: Medicare Other | Admitting: General Surgery

## 2016-10-13 ENCOUNTER — Encounter: Payer: Self-pay | Admitting: General Surgery

## 2016-10-13 VITALS — BP 116/82 | HR 88 | Resp 12 | Ht 60.0 in | Wt 123.0 lb

## 2016-10-13 DIAGNOSIS — K4090 Unilateral inguinal hernia, without obstruction or gangrene, not specified as recurrent: Secondary | ICD-10-CM

## 2016-10-13 NOTE — Patient Instructions (Addendum)
Return as needed.  May drive when comfortable and confident behind the wheel of the car. Proper lifting techniques reviewed. Resume activities as tolerated.

## 2016-10-13 NOTE — Progress Notes (Signed)
Patient ID: Stephanie Patel, female   DOB: [birth year], 71 y.o.   MRN: [id number]  Chief Complaint  Patient presents with  . Routine Post Op    HPI Stephanie Patel is a 71 y.o. female.  Here today for postoperative visit,right inguinal hernia on 10-06-16, she states she is doing well. Denies any gastrointestinal issues, bowels are moving regular and she had to stop the stool softnerner.  She will restart her Humira next week.  HPI  Past Medical History:  Diagnosis Date  . Cancer ([place])    SQUAMOUS CELL-HEAD  . Chronic cough    USES TESSALON PEARLS PRN-NEVER PRODUCTIVE COUGH, ONLY DRY  . Depression   . GERD (gastroesophageal reflux disease)   . Heart murmur   . IBS (irritable bowel syndrome)   . LVH (left ventricular hypertrophy)   . Nocturnal hypoxemia    ON O2 @ 2 LITERS [place] ONLY AT NIGHT  . 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 Rheumatoid arthritis ([place])    RA    Past Surgical History:  Procedure Laterality Date  . ABDOMINAL HYSTERECTOMY    . APPENDECTOMY    . BREAST CYST ASPIRATION Left    neg  . COLONOSCOPY    . INGUINAL HERNIA REPAIR Right 10/06/2016   Medium Ultra Pro mesh  Surgeon: Robert Bellow, MD;  Location: [facility] ORS;  Service: General;  Laterality: Right;    Family History  Problem Relation Age of Onset  . Alzheimer's disease Mother   . Heart disease Father   . Mitral valve prolapse Father   . Colon polyps Brother     Social History Social History  Substance Use Topics  . Smoking status: Former Smoker    Packs/day: 0.50    Years: 15.00    Quit date: 06/05/1976  . Smokeless tobacco: Never Used  . Alcohol use Yes     Comment: Wine occasional    Allergies  Allergen Reactions  . Codeine Other (See Comments)    Tolerates with anti-nausea medication (GG:[id number])  . Keflex [Cephalexin] Hives    Current Outpatient Prescriptions  Medication Sig Dispense Refill  . acidophilus (RISAQUAD) CAPS capsule Take 1 capsule by mouth daily.     . benzonatate (TESSALON) 200 MG capsule take 1 capsule by mouth three times a day if needed for cough 30 capsule 1  . BREO ELLIPTA 100-25 MCG/INH AEPB inhale 1 puff by mouth INTO THE LUNGS once daily (Patient taking differently: inhale 1 puff by mouth INTO THE LUNGS- PRN) 60 each 11  . buPROPion (WELLBUTRIN XL) 300 MG 24 hr tablet Take 300 mg by mouth every morning.     . busPIRone (BUSPAR) 15 MG tablet Take 15 mg by mouth 2 (two) times daily.    . Calcium Carbonate-Vit D-Min (CALCIUM 1200 PO) Take 1 tablet by mouth at bedtime.     . cetirizine (ZYRTEC ALLERGY) 10 MG tablet Take 1 tablet (10 mg total) by mouth daily. 30 tablet 6  . conjugated estrogens (PREMARIN) vaginal cream Place 0.625 mg vaginally 2 (two) times a week.     . desvenlafaxine (PRISTIQ) 50 MG 24 hr tablet Take 50 mg by mouth every morning.     . dicyclomine (BENTYL) 20 MG tablet Take 20 mg by mouth 3 (three) times daily before meals.   0  . fluticasone (FLONASE) 50 MCG/ACT nasal spray Place 2 sprays into both nostrils daily. 16 g 10  . hydroxychloroquine (PLAQUENIL) 200 MG tablet Take  200 mg by mouth 2 (two) times daily.   0  . leflunomide (ARAVA) 20 MG tablet Take 20 mg by mouth every morning.   0  . Nutritional Supplements (NUTRITIONAL DRINK PO) Take 325 mLs by mouth daily. [employer] Meal Replacement Shake 1.0 cal/ml (with fruit blended) Dairy-Free & Gluten Free    . omeprazole (PRILOSEC) 40 MG capsule Take 40 mg by mouth daily before breakfast.   0  . OXYGEN Inhale 2 L into the lungs at bedtime.     . tolterodine (DETROL LA) 4 MG 24 hr capsule Take 4 mg by mouth daily.    . traZODone (DESYREL) 50 MG tablet take 1 tablet by mouth at bedtime if needed (Patient taking differently: take 0.5 tablet (25 mg) by mouth at bedtime if needed for sleep) 30 tablet 1  . valACYclovir (VALTREX) 500 MG tablet Take 500 mg by mouth 2 (two) times a week. Tuesday & Friday    . Adalimumab (HUMIRA) 40 MG/0.4ML PSKT Inject 40 mg into the skin every  14 (fourteen) days.      No current facility-administered medications for this visit.     Review of Systems Review of Systems  Constitutional: Negative.   Respiratory: Negative.   Cardiovascular: Negative.     Blood pressure 116/82, pulse 88, resp. rate 12, height 5' (1.524 m), weight 55.8 kg (123 lb), SpO2 97 %.  Physical Exam Physical Exam  Constitutional: She is oriented to person, place, and time. She appears well-developed and well-nourished.  Abdominal: Soft. No hernia.    Right inguinal hernia repair intact with minimal bruising.  Neurological: She is alert and oriented to person, place, and time.  Skin: Skin is warm and dry.  Psychiatric: Her behavior is normal.     Assessment    Doing well post right indirect inguinal hernia repair with prosthetic mesh.    Plan         May drive when comfortable and confident behind the wheel of the car. Proper lifting techniques reviewed. Resume activities as tolerated.  Follow up as needed.  HPI, Physical Exam, Assessment and Plan have been scribed under the direction and in the presence of Robert Bellow, MD. Karie Fetch, RN  I have completed the exam and reviewed the above documentation for accuracy and completeness.  I agree with the above.  Haematologist has been used and any errors in dictation or transcription are unintentional.  Hervey Ard, M.D., F.A.C.S.  Robert Bellow 10/15/2016, 7:51 AM

## 2016-10-15 ENCOUNTER — Encounter: Payer: Self-pay | Admitting: General Surgery

## 2016-10-23 ENCOUNTER — Other Ambulatory Visit: Payer: Self-pay | Admitting: *Deleted

## 2016-10-23 ENCOUNTER — Telehealth: Payer: Self-pay | Admitting: Pulmonary Disease

## 2016-10-23 DIAGNOSIS — J84112 Idiopathic pulmonary fibrosis: Secondary | ICD-10-CM

## 2016-10-23 NOTE — Telephone Encounter (Signed)
PFT scheduled for Tues 11/24/16 at 10:30 at [facility]. ROV scheduled for Friday 12/04/16 at 3:00 with Dr. Alva Garnet. Pt advised to get CXR day of her appointment with Dr. Alva Garnet. Arrive at [facility] around 2:00 on 12/04/16, then after CXR come to the [place] [place] for appointment with Dr. Alva Garnet.  PFT instruction sheet mailed to patient along with Dr. Alva Garnet appointment. Nothing else needed at this time. Rhonda J Cobb

## 2016-10-23 NOTE — Telephone Encounter (Signed)
Patient needs to schedule pft prior to ov   Also needs chest xray

## 2016-10-23 NOTE — Telephone Encounter (Signed)
Please schedule PFT for pt. Thanks.

## 2016-11-08 ENCOUNTER — Emergency Department: Payer: Medicare Other

## 2016-11-08 ENCOUNTER — Encounter: Payer: Self-pay | Admitting: Emergency Medicine

## 2016-11-08 ENCOUNTER — Other Ambulatory Visit: Payer: Self-pay

## 2016-11-08 ENCOUNTER — Observation Stay
Admission: EM | Admit: 2016-11-08 | Discharge: 2016-11-11 | Disposition: A | Discharge status: Home / Self Care | Payer: Medicare Other | Attending: Internal Medicine | Admitting: Internal Medicine

## 2016-11-08 DIAGNOSIS — I517 Cardiomegaly: Secondary | ICD-10-CM | POA: Diagnosis not present

## 2016-11-08 DIAGNOSIS — F32A Depression, unspecified: Secondary | ICD-10-CM | POA: Diagnosis present

## 2016-11-08 DIAGNOSIS — I119 Hypertensive heart disease without heart failure: Secondary | ICD-10-CM | POA: Diagnosis not present

## 2016-11-08 DIAGNOSIS — J189 Pneumonia, unspecified organism: Secondary | ICD-10-CM | POA: Diagnosis present

## 2016-11-08 DIAGNOSIS — F419 Anxiety disorder, unspecified: Secondary | ICD-10-CM | POA: Insufficient documentation

## 2016-11-08 DIAGNOSIS — K589 Irritable bowel syndrome without diarrhea: Secondary | ICD-10-CM | POA: Diagnosis not present

## 2016-11-08 DIAGNOSIS — M069 Rheumatoid arthritis, unspecified: Secondary | ICD-10-CM | POA: Insufficient documentation

## 2016-11-08 DIAGNOSIS — F329 Major depressive disorder, single episode, unspecified: Secondary | ICD-10-CM | POA: Diagnosis not present

## 2016-11-08 DIAGNOSIS — Z87891 Personal history of nicotine dependence: Secondary | ICD-10-CM | POA: Diagnosis not present

## 2016-11-08 DIAGNOSIS — Z79899 Other long term (current) drug therapy: Secondary | ICD-10-CM | POA: Insufficient documentation

## 2016-11-08 DIAGNOSIS — J8410 Pulmonary fibrosis, unspecified: Secondary | ICD-10-CM | POA: Insufficient documentation

## 2016-11-08 DIAGNOSIS — J849 Interstitial pulmonary disease, unspecified: Secondary | ICD-10-CM | POA: Diagnosis present

## 2016-11-08 DIAGNOSIS — M0609 Rheumatoid arthritis without rheumatoid factor, multiple sites: Secondary | ICD-10-CM | POA: Diagnosis present

## 2016-11-08 DIAGNOSIS — K219 Gastro-esophageal reflux disease without esophagitis: Secondary | ICD-10-CM | POA: Diagnosis not present

## 2016-11-08 LAB — COMPREHENSIVE METABOLIC PANEL
ALK PHOS: 57 U/L (ref 38–126)
ALT: 15 U/L (ref 14–54)
AST: 25 U/L (ref 15–41)
Albumin: 3.5 g/dL (ref 3.5–5.0)
Anion gap: 10 (ref 5–15)
BUN: 13 mg/dL (ref 6–20)
CALCIUM: 8.8 mg/dL — AB (ref 8.9–10.3)
CO2: 27 mmol/L (ref 22–32)
CREATININE: 0.71 mg/dL (ref 0.44–1.00)
Chloride: 97 mmol/L — ABNORMAL LOW (ref 101–111)
Glucose, Bld: 123 mg/dL — ABNORMAL HIGH (ref 65–99)
Potassium: 3.3 mmol/L — ABNORMAL LOW (ref 3.5–5.1)
Sodium: 134 mmol/L — ABNORMAL LOW (ref 135–145)
Total Bilirubin: 1 mg/dL (ref 0.3–1.2)
Total Protein: 6.7 g/dL (ref 6.5–8.1)

## 2016-11-08 LAB — CBC
HEMATOCRIT: 37.1 % (ref 35.0–47.0)
HEMOGLOBIN: 12.4 g/dL (ref 12.0–16.0)
MCH: 32.3 pg (ref 26.0–34.0)
MCHC: 33.3 g/dL (ref 32.0–36.0)
MCV: 97 fL (ref 80.0–100.0)
Platelets: 177 10*3/uL (ref 150–440)
RBC: 3.83 MIL/uL (ref 3.80–5.20)
RDW: 13.6 % (ref 11.5–14.5)
WBC: 17.3 10*3/uL — AB (ref 3.6–11.0)

## 2016-11-08 LAB — LACTIC ACID, PLASMA
Lactic Acid, Venous: 0.9 mmol/L (ref 0.5–1.9)
Lactic Acid, Venous: 1 mmol/L (ref 0.5–1.9)

## 2016-11-08 LAB — URINALYSIS, COMPLETE (UACMP) WITH MICROSCOPIC
BACTERIA UA: NONE SEEN
BILIRUBIN URINE: NEGATIVE
GLUCOSE, UA: NEGATIVE mg/dL
HGB URINE DIPSTICK: NEGATIVE
Ketones, ur: NEGATIVE mg/dL
LEUKOCYTES UA: NEGATIVE
NITRITE: NEGATIVE
PROTEIN: 30 mg/dL — AB
Specific Gravity, Urine: 1.016 (ref 1.005–1.030)
pH: 7 (ref 5.0–8.0)

## 2016-11-08 LAB — LIPASE, BLOOD: Lipase: 20 U/L (ref 11–51)

## 2016-11-08 LAB — TROPONIN I

## 2016-11-08 MED ORDER — HYDROXYCHLOROQUINE SULFATE 200 MG PO TABS
200.0000 mg | ORAL_TABLET | Freq: Two times a day (BID) | ORAL | Status: DC
  Administered 2016-11-08 – 2016-11-11 (×6): 200 mg via ORAL
  Filled 2016-11-08 (×7): qty 1

## 2016-11-08 MED ORDER — LEFLUNOMIDE 20 MG PO TABS
20.0000 mg | ORAL_TABLET | ORAL | Status: DC
  Administered 2016-11-09 – 2016-11-11 (×3): 20 mg via ORAL
  Filled 2016-11-08 (×3): qty 1

## 2016-11-08 MED ORDER — IPRATROPIUM-ALBUTEROL 0.5-2.5 (3) MG/3ML IN SOLN
3.0000 mL | Freq: Four times a day (QID) | RESPIRATORY_TRACT | Status: DC
  Administered 2016-11-08 – 2016-11-10 (×8): 3 mL via RESPIRATORY_TRACT
  Filled 2016-11-08 (×8): qty 3

## 2016-11-08 MED ORDER — SODIUM CHLORIDE 0.9 % IV BOLUS (SEPSIS)
1000.0000 mL | Freq: Once | INTRAVENOUS | Status: AC
  Administered 2016-11-08: 1000 mL via INTRAVENOUS

## 2016-11-08 MED ORDER — LEVOFLOXACIN IN D5W 750 MG/150ML IV SOLN
750.0000 mg | Freq: Once | INTRAVENOUS | Status: AC
  Administered 2016-11-08: 750 mg via INTRAVENOUS
  Filled 2016-11-08: qty 150

## 2016-11-08 MED ORDER — TRAZODONE HCL 50 MG PO TABS
25.0000 mg | ORAL_TABLET | Freq: Every day | ORAL | Status: DC
  Administered 2016-11-08 – 2016-11-10 (×3): 25 mg via ORAL
  Filled 2016-11-08 (×3): qty 1

## 2016-11-08 MED ORDER — DICYCLOMINE HCL 20 MG PO TABS
20.0000 mg | ORAL_TABLET | Freq: Three times a day (TID) | ORAL | Status: DC
  Administered 2016-11-08 – 2016-11-11 (×9): 20 mg via ORAL
  Filled 2016-11-08 (×10): qty 1

## 2016-11-08 MED ORDER — BUSPIRONE HCL 15 MG PO TABS
15.0000 mg | ORAL_TABLET | Freq: Two times a day (BID) | ORAL | Status: DC
  Administered 2016-11-08 – 2016-11-11 (×6): 15 mg via ORAL
  Filled 2016-11-08 (×7): qty 1

## 2016-11-08 MED ORDER — POTASSIUM CHLORIDE IN NACL 20-0.9 MEQ/L-% IV SOLN
INTRAVENOUS | Status: DC
  Administered 2016-11-08 – 2016-11-09 (×2): via INTRAVENOUS
  Filled 2016-11-08 (×3): qty 1000

## 2016-11-08 MED ORDER — DOCUSATE SODIUM 100 MG PO CAPS
100.0000 mg | ORAL_CAPSULE | Freq: Two times a day (BID) | ORAL | Status: DC
  Administered 2016-11-10 – 2016-11-11 (×3): 100 mg via ORAL
  Filled 2016-11-08 (×6): qty 1

## 2016-11-08 MED ORDER — BISACODYL 10 MG RE SUPP: 10.0000 mg | Freq: Every day | RECTAL | Status: DC | PRN

## 2016-11-08 MED ORDER — HEPARIN SODIUM (PORCINE) 5000 UNIT/ML IJ SOLN
5000.0000 [IU] | Freq: Three times a day (TID) | INTRAMUSCULAR | Status: DC
  Administered 2016-11-08 – 2016-11-09 (×2): 5000 [IU] via SUBCUTANEOUS
  Filled 2016-11-08 (×3): qty 1

## 2016-11-08 MED ORDER — DEXTROMETHORPHAN POLISTIREX ER 30 MG/5ML PO SUER
30.0000 mg | Freq: Two times a day (BID) | ORAL | Status: DC
  Administered 2016-11-08: 30 mg via ORAL
  Filled 2016-11-08 (×2): qty 5

## 2016-11-08 MED ORDER — DM-GUAIFENESIN ER 30-600 MG PO TB12: 1.0000 | ORAL_TABLET | Freq: Two times a day (BID) | ORAL | Status: DC

## 2016-11-08 MED ORDER — FESOTERODINE FUMARATE ER 4 MG PO TB24
4.0000 mg | ORAL_TABLET | Freq: Every day | ORAL | Status: DC
  Administered 2016-11-08 – 2016-11-11 (×4): 4 mg via ORAL
  Filled 2016-11-08 (×4): qty 1

## 2016-11-08 MED ORDER — IOPAMIDOL (ISOVUE-370) INJECTION 76%
75.0000 mL | Freq: Once | INTRAVENOUS | Status: AC | PRN
  Administered 2016-11-08: 75 mL via INTRAVENOUS

## 2016-11-08 MED ORDER — LORATADINE 10 MG PO TABS
10.0000 mg | ORAL_TABLET | Freq: Every day | ORAL | Status: DC
  Administered 2016-11-08 – 2016-11-11 (×4): 10 mg via ORAL
  Filled 2016-11-08 (×4): qty 1

## 2016-11-08 MED ORDER — GUAIFENESIN ER 600 MG PO TB12
600.0000 mg | ORAL_TABLET | Freq: Two times a day (BID) | ORAL | Status: DC
  Administered 2016-11-08 – 2016-11-11 (×6): 600 mg via ORAL
  Filled 2016-11-08 (×7): qty 1

## 2016-11-08 MED ORDER — ACETAMINOPHEN 325 MG PO TABS
650.0000 mg | ORAL_TABLET | Freq: Once | ORAL | Status: AC
  Administered 2016-11-08: 650 mg via ORAL
  Filled 2016-11-08: qty 2

## 2016-11-08 MED ORDER — ONDANSETRON HCL 4 MG PO TABS
4.0000 mg | ORAL_TABLET | Freq: Four times a day (QID) | ORAL | Status: DC | PRN
  Administered 2016-11-09: 09:00:00 4 mg via ORAL
  Filled 2016-11-08: qty 1

## 2016-11-08 MED ORDER — FLUTICASONE FUROATE-VILANTEROL 100-25 MCG/INH IN AEPB
1.0000 | INHALATION_SPRAY | Freq: Every day | RESPIRATORY_TRACT | Status: DC
  Administered 2016-11-08 – 2016-11-11 (×3): 1 via RESPIRATORY_TRACT
  Filled 2016-11-08: qty 28

## 2016-11-08 MED ORDER — BUPROPION HCL ER (XL) 300 MG PO TB24
300.0000 mg | ORAL_TABLET | ORAL | Status: DC
  Administered 2016-11-09 – 2016-11-11 (×3): 300 mg via ORAL
  Filled 2016-11-08 (×3): qty 1

## 2016-11-08 MED ORDER — ACETAMINOPHEN 325 MG PO TABS: 650.0000 mg | ORAL_TABLET | Freq: Four times a day (QID) | ORAL | Status: DC | PRN

## 2016-11-08 MED ORDER — FLUTICASONE PROPIONATE 50 MCG/ACT NA SUSP
2.0000 | Freq: Every day | NASAL | Status: DC
  Administered 2016-11-08 – 2016-11-11 (×4): 2 via NASAL
  Filled 2016-11-08: qty 16

## 2016-11-08 MED ORDER — LEVOFLOXACIN IN D5W 750 MG/150ML IV SOLN
750.0000 mg | INTRAVENOUS | Status: DC
  Administered 2016-11-09 – 2016-11-10 (×2): 750 mg via INTRAVENOUS
  Filled 2016-11-08 (×2): qty 150

## 2016-11-08 MED ORDER — VALACYCLOVIR HCL 500 MG PO TABS
500.0000 mg | ORAL_TABLET | ORAL | Status: DC
  Filled 2016-11-08: qty 1

## 2016-11-08 MED ORDER — VENLAFAXINE HCL ER 75 MG PO CP24
75.0000 mg | ORAL_CAPSULE | Freq: Every day | ORAL | Status: DC
  Administered 2016-11-09 – 2016-11-11 (×3): 75 mg via ORAL
  Filled 2016-11-08 (×3): qty 1

## 2016-11-08 MED ORDER — POTASSIUM CHLORIDE CRYS ER 20 MEQ PO TBCR
40.0000 meq | EXTENDED_RELEASE_TABLET | Freq: Once | ORAL | Status: AC
  Administered 2016-11-08: 40 meq via ORAL
  Filled 2016-11-08: qty 2

## 2016-11-08 MED ORDER — ONDANSETRON HCL 4 MG/2ML IJ SOLN: 4.0000 mg | Freq: Four times a day (QID) | INTRAMUSCULAR | Status: DC | PRN

## 2016-11-08 MED ORDER — PANTOPRAZOLE SODIUM 40 MG PO TBEC
40.0000 mg | DELAYED_RELEASE_TABLET | Freq: Every day | ORAL | Status: DC
  Administered 2016-11-08 – 2016-11-11 (×4): 40 mg via ORAL
  Filled 2016-11-08 (×4): qty 1

## 2016-11-08 MED ORDER — ACETAMINOPHEN 650 MG RE SUPP: 650.0000 mg | Freq: Four times a day (QID) | RECTAL | Status: DC | PRN

## 2016-11-08 NOTE — ED Notes (Signed)
Attempted to call report

## 2016-11-08 NOTE — ED Provider Notes (Signed)
[facility] Emergency Department Provider Note ____________________________________________   I have reviewed the triage vital signs and the triage nursing note.  HISTORY  Chief Complaint Nausea and Headache   Historian Patient  HPI Stephanie Patel is a 71 y.o. female with pulmonary fibrosis and rheumatoid arthritis presents with 2 days of weakness and cough.  Today had some right sided pleuritic chest pain. She is also very dyspneic.  Reports fever to 101 this morning took antipyretic.  She follows with pulmonologist at least every 3 months.  She is currently living alone after her husband died.  She feels quite anxious about dyspnea and right-sided chest pain and coughing.  Not bringing up any productive sputum at this point.    Past Medical History:  Diagnosis Date  . 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 Chronic cough    USES TESSALON PEARLS PRN-NEVER PRODUCTIVE COUGH, ONLY DRY  . Depression   . GERD (gastroesophageal reflux disease)   . Heart murmur   . IBS (irritable bowel syndrome)   . LVH (left ventricular hypertrophy)   . Nocturnal hypoxemia    ON O2 @ 2 LITERS [place] ONLY AT NIGHT  . 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 Rheumatoid arthritis ([place])    RA    Patient Active Problem List   Diagnosis Date Noted  . Right inguinal hernia 09/29/2016  . HTN, goal below 140/80 12/26/2015  . ILD (interstitial lung disease) ([place]) 03/28/2015  . Acute respiratory failure with hypoxia ([place]) 03/18/2015  . Closed torus fracture of distal end of right fibula 10/03/2013  . Depression 08/21/2013  . Environmental allergies 08/21/2013  . Irritable bowel syndrome 08/21/2013  . LVH (left ventricular hypertrophy) 08/21/2013  . Osteoarthritis of knee 08/21/2013  . Vitamin D deficiency, unspecified 08/21/2013  . Rheumatoid arthritis of multiple sites with negative rheumatoid factor ([place]) 02/10/2012    Past Surgical History:  Procedure  Laterality Date  . ABDOMINAL HYSTERECTOMY    . APPENDECTOMY    . BREAST CYST ASPIRATION Left    neg  . COLONOSCOPY      Prior to Admission medications   Medication Sig Start Date End Date Taking? Authorizing Provider  acidophilus (RISAQUAD) CAPS capsule Take 1 capsule by mouth daily.   Yes [provider]  buPROPion (WELLBUTRIN XL) 300 MG 24 hr tablet Take 300 mg by mouth every morning.    Yes [provider]  busPIRone (BUSPAR) 15 MG tablet Take 15 mg by mouth 2 (two) times daily.   Yes [provider]  Calcium Carbonate-Vit D-Min (CALCIUM 1200 PO) Take 1 tablet by mouth at bedtime.    Yes [provider]  cetirizine (ZYRTEC ALLERGY) 10 MG tablet Take 1 tablet (10 mg total) by mouth daily. 08/03/16  Yes Flora Lipps, MD  conjugated estrogens (PREMARIN) vaginal cream Place 0.625 mg vaginally 2 (two) times a week.  01/27/16  Yes [provider]  desvenlafaxine (PRISTIQ) 50 MG 24 hr tablet Take 50 mg by mouth every morning.  04/17/16 04/17/17 Yes [provider]  dicyclomine (BENTYL) 20 MG tablet Take 20 mg 3 (three) times daily before meals by mouth. Take 1 tablet by mouth two to three times daily 08/15/16  Yes [provider]  hydroxychloroquine (PLAQUENIL) 200 MG tablet Take 200 mg by mouth 2 (two) times daily.  08/15/16  Yes [provider]  leflunomide (ARAVA) 20 MG tablet Take 20 mg by mouth every morning.  10/11/15  Yes [provider]  omeprazole (PRILOSEC) 40 MG capsule Take 40 mg by mouth daily before breakfast.  08/02/16  Yes [provider]  tolterodine (DETROL LA) 4 MG 24 hr capsule Take 4 mg by mouth daily.   Yes [provider]  Adalimumab (HUMIRA) 40 MG/0.4ML PSKT Inject 40 mg into the skin every 14 (fourteen) days.     [provider]  benzonatate (TESSALON) 200 MG capsule take 1 capsule by mouth three times a day if needed for cough Patient not taking: Reported on 11/08/2016  07/31/16   Wilhelmina Mcardle, MD  BREO ELLIPTA 100-25 MCG/INH AEPB inhale 1 puff by mouth INTO THE LUNGS once daily Patient taking differently: inhale 1 puff by mouth INTO THE LUNGS- PRN 10/02/16   Wilhelmina Mcardle, MD  fluticasone (FLONASE) 50 MCG/ACT nasal spray Place 2 sprays into both nostrils daily. 10/21/15   Wilhelmina Mcardle, MD  Nutritional Supplements (NUTRITIONAL DRINK PO) Take 325 mLs by mouth daily. [employer] Meal Replacement Shake 1.0 cal/ml (with fruit blended) Dairy-Free & Gluten Free    [provider]  OXYGEN Inhale 2 L into the lungs at bedtime.     [provider]  traZODone (DESYREL) 50 MG tablet take 1 tablet by mouth at bedtime if needed Patient taking differently: take 0.5 tablet (25 mg) by mouth at bedtime if needed for sleep 08/24/16   Wilhelmina Mcardle, MD  valACYclovir (VALTREX) 500 MG tablet Take 500 mg by mouth 2 (two) times a week. Tuesday & Friday    [provider]    Allergies  Allergen Reactions  . Codeine Other (See Comments)    Tolerates with anti-nausea medication (RC:[id number])  . Keflex [Cephalexin] Hives    Family History  Problem Relation Age of Onset  . Alzheimer's disease Mother   . Heart disease Father   . Mitral valve prolapse Father   . Colon polyps Brother     Social History Social History   Tobacco Use  . Smoking status: Former Smoker    Packs/day: 0.50    Years: 15.00    Pack years: 7.50    Last attempt to quit: 06/05/1976    Years since quitting: 40.4  . Smokeless tobacco: Never Used  Substance Use Topics  . Alcohol use: Yes    Comment: Wine occasional  . Drug use: No    Review of Systems  Constitutional: Positive for fever. Eyes: Negative for visual changes. ENT: Negative for sore throat. Cardiovascular: Positive for chest pain. Respiratory: Positive for shortness of breath. Gastrointestinal: Negative for abdominal pain, vomiting and diarrhea. Genitourinary: Negative for  dysuria. Musculoskeletal: Negative for back pain. Skin: Negative for rash. Neurological: Negative for headache.  ____________________________________________   PHYSICAL EXAM:  VITAL SIGNS: ED Triage Vitals  Enc Vitals Group     BP 11/08/16 0821 (!) 124/92     Pulse Rate 11/08/16 0821 87     Resp 11/08/16 0821 16     Temp 11/08/16 0821 99.2 F (37.3 C)     Temp Source 11/08/16 0821 Oral     SpO2 11/08/16 0821 94 %     Weight 11/08/16 0817 125 lb (56.7 kg)     Height 11/08/16 0817 5' (1.524 m)     Head Circumference --      Peak Flow --      Pain Score 11/08/16 0817 5     Pain Loc --      Pain Edu? --  Excl. in [place]? --      Constitutional: Alert and oriented. Well appearing and in no distress. HEENT   Head: Normocephalic and atraumatic.      Eyes: Conjunctivae are normal. Pupils equal and round.       Ears:         Nose: No congestion/rhinnorhea.   Mouth/Throat: Mucous membranes are moist.   Neck: No stridor. Cardiovascular/Chest: Normal rate, regular rhythm.  No murmurs, rubs, or gallops. Respiratory: Normal respiratory effort without tachypnea nor retractions. Breath sounds are clear and equal bilaterally.  Mild rhonchi both bases. Gastrointestinal: Soft. No distention, no guarding, no rebound. Nontender.    Genitourinary/rectal:Deferred Musculoskeletal: Nontender with normal range of motion in all extremities. No joint effusions.  No lower extremity tenderness.  No edema. Neurologic:  Normal speech and language. No gross or focal neurologic deficits are appreciated. Skin:  Skin is warm, dry and intact. No rash noted. Psychiatric: Mood and affect are normal. Speech and behavior are normal. Patient exhibits appropriate insight and judgment.   ____________________________________________  LABS (pertinent positives/negatives) I, Lisa Roca, MD the attending physician have reviewed the labs noted below.  Labs Reviewed  COMPREHENSIVE METABOLIC PANEL -  Abnormal; Notable for the following components:      Result Value   Sodium 134 (*)    Potassium 3.3 (*)    Chloride 97 (*)    Glucose, Bld 123 (*)    Calcium 8.8 (*)    All other components within normal limits  CBC - Abnormal; Notable for the following components:   WBC 17.3 (*)    All other components within normal limits  URINALYSIS, COMPLETE (UACMP) WITH MICROSCOPIC - Abnormal; Notable for the following components:   Color, Urine YELLOW (*)    APPearance CLEAR (*)    Protein, ur 30 (*)    Squamous Epithelial / LPF 0-5 (*)    All other components within normal limits  CULTURE, BLOOD (ROUTINE X 2)  CULTURE, BLOOD (ROUTINE X 2)  LIPASE, BLOOD  TROPONIN I  LACTIC ACID, PLASMA  LACTIC ACID, PLASMA    ____________________________________________    EKG I, Lisa Roca, MD, the attending physician have personally viewed and interpreted all ECGs.  80 beats per minute.  Normal sinus rhythm.  No acute spinal axis.  Nonspecific ST and T wave ____________________________________________  RADIOLOGY All Xrays were viewed by me.  Imaging interpreted by Radiologist, and I, Lisa Roca, MD the attending physician have reviewed the radiologist interpretation noted below.  Cxr:  IMPRESSION: 1. Acute on chronic abnormal interstitial accentuation in the lungs, with associated cardiomegaly. The acute component could be due to edema or atypical pneumonia. 2. Retro diaphragmatic airspace opacity on the right, possibly from pneumonia. 3. Aortic Atherosclerosis (ICD10-I70.0). Aortic tortuosity. 4. Vague density over the left upper chest space be simply due to soft tissues the chest wall, with some sort of indistinct infiltrate or pleural thickening is a differential diagnostic consideration.  CT with contrast:    CLINICAL DATA: Arrives with c/o feeling ill since Friday, a headache that started yesterday, right back / rib pain with deep inspiration x 1 day, and feels of nausea that  started today. Hx of pneumonia, rheumatoid arthritis, pulmonary fibrosis, nocturnal hypoxemia, left ventricular hypertrophy, heart murmur, GERD, also hx of smoking. Denies any hx of blood clots.  EXAM: CT ANGIOGRAPHY CHEST WITH CONTRAST  TECHNIQUE: Multidetector CT imaging of the chest was performed using the standard protocol during bolus administration of intravenous contrast. Multiplanar CT image reconstructions  and MIPs were obtained to evaluate the vascular anatomy.  CONTRAST: 75 mL of Isovue 370 intravenous contrast  COMPARISON: Current chest radiographs. High-resolution chest CT, 05/07/2015. Chest CT 03/18/2015.  FINDINGS: Cardiovascular: Satisfactory opacification of the pulmonary arteries to the segmental level. No evidence of pulmonary embolism.  Heart is mildly enlarged. No coronary artery calcifications. Ascending aorta measures 4 cm in diameter. There is minor atherosclerotic plaque along the arch and descending thoracic aorta.  Mediastinum/Nodes: No neck base, axillary, mediastinal or hilar masses or enlarged lymph nodes. Trachea is patent. Esophagus unremarkable other than a small hiatal hernia.  Lungs/Pleura: Focal consolidation noted in the posterior right lower lobe of the lung base. Although this may be atelectasis, pneumonia should be suspected in the proper clinical setting. No other evidence of pneumonia. No evidence of pulmonary edema. Peripheral interstitial thickening is stable reflecting fibrosis. No lung masses or suspicious nodules. No pleural effusion or pneumothorax.  Upper Abdomen: No acute abnormality.  Musculoskeletal: No acute fracture. No osteoblastic or osteolytic lesions.  Review of the MIP images confirms the above findings.  IMPRESSION: 1. No evidence of a pulmonary embolism. 2. Focal opacity in the posterior right lower lobe. Suspect pneumonia if there are consistent clinical findings. Alternatively, this could reflect  atelectasis. 3. No other evidence of an acute abnormality. 4. Mild cardiomegaly. 5. Mild aortic atherosclerosis. 6. Mild dilation of the ascending aorta. Recommend annual imaging followup by CTA or MRA. This recommendation follows 2010 ACCF/AHA/AATS/ACR/ASA/SCA/SCAI/SIR/STS/SVM Guidelines for the Diagnosis and Management of Patients with Thoracic Aortic Disease. Circulation. 2010; 121: E174-Y814      __________________________________________  PROCEDURES  Procedure(s) performed: None  Critical Care performed: None  ____________________________________________  No current facility-administered medications on file prior to encounter.    Current Outpatient Medications on File Prior to Encounter  Medication Sig Dispense Refill  . acidophilus (RISAQUAD) CAPS capsule Take 1 capsule by mouth daily.    Marland Kitchen buPROPion (WELLBUTRIN XL) 300 MG 24 hr tablet Take 300 mg by mouth every morning.     . busPIRone (BUSPAR) 15 MG tablet Take 15 mg by mouth 2 (two) times daily.    . Calcium Carbonate-Vit D-Min (CALCIUM 1200 PO) Take 1 tablet by mouth at bedtime.     . cetirizine (ZYRTEC ALLERGY) 10 MG tablet Take 1 tablet (10 mg total) by mouth daily. 30 tablet 6  . conjugated estrogens (PREMARIN) vaginal cream Place 0.625 mg vaginally 2 (two) times a week.     . desvenlafaxine (PRISTIQ) 50 MG 24 hr tablet Take 50 mg by mouth every morning.     . dicyclomine (BENTYL) 20 MG tablet Take 20 mg 3 (three) times daily before meals by mouth. Take 1 tablet by mouth two to three times daily  0  . hydroxychloroquine (PLAQUENIL) 200 MG tablet Take 200 mg by mouth 2 (two) times daily.   0  . leflunomide (ARAVA) 20 MG tablet Take 20 mg by mouth every morning.   0  . omeprazole (PRILOSEC) 40 MG capsule Take 40 mg by mouth daily before breakfast.   0  . tolterodine (DETROL LA) 4 MG 24 hr capsule Take 4 mg by mouth daily.    . Adalimumab (HUMIRA) 40 MG/0.4ML PSKT Inject 40 mg into the skin every 14 (fourteen) days.      . benzonatate (TESSALON) 200 MG capsule take 1 capsule by mouth three times a day if needed for cough (Patient not taking: Reported on 11/08/2016) 30 capsule 1  . BREO ELLIPTA 100-25 MCG/INH AEPB inhale  1 puff by mouth INTO THE LUNGS once daily (Patient taking differently: inhale 1 puff by mouth INTO THE LUNGS- PRN) 60 each 11  . fluticasone (FLONASE) 50 MCG/ACT nasal spray Place 2 sprays into both nostrils daily. 16 g 10  . Nutritional Supplements (NUTRITIONAL DRINK PO) Take 325 mLs by mouth daily. [employer] Meal Replacement Shake 1.0 cal/ml (with fruit blended) Dairy-Free & Gluten Free    . OXYGEN Inhale 2 L into the lungs at bedtime.     . traZODone (DESYREL) 50 MG tablet take 1 tablet by mouth at bedtime if needed (Patient taking differently: take 0.5 tablet (25 mg) by mouth at bedtime if needed for sleep) 30 tablet 1  . valACYclovir (VALTREX) 500 MG tablet Take 500 mg by mouth 2 (two) times a week. Tuesday & Friday      ____________________________________________  ED COURSE / ASSESSMENT AND PLAN  Pertinent labs & imaging results that were available during my care of the patient were reviewed by me and considered in my medical decision making (see chart for details).   Patient reports fever this morning with cough for several days, and dyspnea as well as right-sided pleuritic chest pain.  She does seem quite anxious about health deteriorating over the last 2 days, especially with underlying poor lung function from pulmonary fibrosis and rheumatoid arthritis for which she takes Humira.  Chest x-ray indicates likely pneumonia, but over top of a fibrotic picture.  I did discuss with patient risk and benefit to obtain chest CT to rule out PE and further delineate possible pneumonia.  Chest CT shows likely right-sided pneumonia, and no PE.  Patient does have a significantly elevated white blood cell count of 17,000 with left shift.  Although she was not febrile here, she was febrile at  home to 101.  Because she is somewhat immunocompromised on Humira with underlying pulmonary fibrosis and rheumatoid arthritis, I do have significant concern for the possibility of worsening and for early sepsis.  At present, does not appear to be sepsis with normal lactate no hypotension.  DIFFERENTIAL DIAGNOSIS: Differential includes, but is not limited to, viral syndrome, bronchitis including COPD exacerbation, pneumonia, reactive airway disease including asthma, CHF including exacerbation with or without pulmonary/interstitial edema, pneumothorax, ACS, thoracic trauma, and pulmonary embolism.  Differential diagnosis includes, but is not limited to, ACS, aortic dissection, pulmonary embolism, cardiac tamponade, pneumothorax, pneumonia, pericarditis/myocarditis, GI-related causes including esophagitis/gastritis, and musculoskeletal chest wall pain.    CONSULTATIONS:   Hospitalist for admission.   Patient / Family / Caregiver informed of clinical course, medical decision-making process, and agree with plan.   ___________________________________________   FINAL CLINICAL IMPRESSION(S) / ED DIAGNOSES   Final diagnoses:  Community acquired pneumonia of right lung, unspecified part of lung              Note: This dictation was prepared with Sales executive. Any transcriptional errors that result from this process are unintentional    Lisa Roca, MD 11/08/16 1357

## 2016-11-08 NOTE — H&P (Signed)
History and Physical    Stephanie Patel QJJ:[id number] DOB: February 11, 1945 DOA: 11/08/2016  Referring physician: Dr. Reita Cliche PCP: Idelle Crouch, MD  Specialists: none  Chief Complaint: right rib pain with SOB  HPI: Stephanie Patel is a 71 y.o. female has a past medical history significant for HTN, ILD, and anxiety/depression now with 2-3 day hx of cough and SOB with right-sided flank and rib pain. In ER, WBC=17k and CT shows RLL pneumonia. Oxygenation is borderline. She is now admitted. Some fever. No N/V/D. On immunosuppresants. Followed by Pulmonology for ILD.  Review of Systems: The patient denies anorexia, fever, weight loss,, vision loss, decreased hearing, hoarseness, chest pain, syncope, peripheral edema, balance deficits, hemoptysis, abdominal pain, melena, hematochezia, severe indigestion/heartburn, hematuria, incontinence, genital sores, muscle weakness, suspicious skin lesions, transient blindness, difficulty walking, depression, unusual weight change, abnormal bleeding, enlarged lymph nodes, angioedema, and breast masses.   Past Medical History:  Diagnosis Date  . 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 Chronic cough    USES TESSALON PEARLS PRN-NEVER PRODUCTIVE COUGH, ONLY DRY  . Depression   . GERD (gastroesophageal reflux disease)   . Heart murmur   . IBS (irritable bowel syndrome)   . LVH (left ventricular hypertrophy)   . Nocturnal hypoxemia    ON O2 @ 2 LITERS [place] ONLY AT NIGHT  . 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 Rheumatoid arthritis ([place])    RA   Past Surgical History:  Procedure Laterality Date  . ABDOMINAL HYSTERECTOMY    . APPENDECTOMY    . BREAST CYST ASPIRATION Left    neg  . COLONOSCOPY     Social History:  reports that she quit smoking about 40 years ago. She has a 7.50 pack-year smoking history. she has never used smokeless tobacco. She reports that she drinks alcohol. She reports that she does not use drugs.  Allergies  Allergen  Reactions  . Codeine Other (See Comments)    Tolerates with anti-nausea medication (GY:[id number])  . Keflex [Cephalexin] Hives    Family History  Problem Relation Age of Onset  . Alzheimer's disease Mother   . Heart disease Father   . Mitral valve prolapse Father   . Colon polyps Brother     Prior to Admission medications   Medication Sig Start Date End Date Taking? Authorizing Provider  acidophilus (RISAQUAD) CAPS capsule Take 1 capsule by mouth daily.   Yes [provider]  buPROPion (WELLBUTRIN XL) 300 MG 24 hr tablet Take 300 mg by mouth every morning.    Yes [provider]  busPIRone (BUSPAR) 15 MG tablet Take 15 mg by mouth 2 (two) times daily.   Yes [provider]  Calcium Carbonate-Vit D-Min (CALCIUM 1200 PO) Take 1 tablet by mouth at bedtime.    Yes [provider]  cetirizine (ZYRTEC ALLERGY) 10 MG tablet Take 1 tablet (10 mg total) by mouth daily. 08/03/16  Yes Flora Lipps, MD  conjugated estrogens (PREMARIN) vaginal cream Place 0.625 mg vaginally 2 (two) times a week.  01/27/16  Yes [provider]  desvenlafaxine (PRISTIQ) 50 MG 24 hr tablet Take 50 mg by mouth every morning.  04/17/16 04/17/17 Yes [provider]  dicyclomine (BENTYL) 20 MG tablet Take 20 mg 3 (three) times daily before meals by mouth. Take 1 tablet by mouth two to three times daily 08/15/16  Yes [provider]  hydroxychloroquine (PLAQUENIL) 200 MG tablet Take 200 mg by mouth  2 (two) times daily.  08/15/16  Yes [provider]  leflunomide (ARAVA) 20 MG tablet Take 20 mg by mouth every morning.  10/11/15  Yes [provider]  omeprazole (PRILOSEC) 40 MG capsule Take 40 mg by mouth daily before breakfast.  08/02/16  Yes [provider]  tolterodine (DETROL LA) 4 MG 24 hr capsule Take 4 mg by mouth daily.   Yes [provider]  Adalimumab (HUMIRA) 40 MG/0.4ML PSKT Inject 40 mg into the skin every 14 (fourteen)  days.     [provider]  benzonatate (TESSALON) 200 MG capsule take 1 capsule by mouth three times a day if needed for cough Patient not taking: Reported on 11/08/2016 07/31/16   Wilhelmina Mcardle, MD  BREO ELLIPTA 100-25 MCG/INH AEPB inhale 1 puff by mouth INTO THE LUNGS once daily Patient taking differently: inhale 1 puff by mouth INTO THE LUNGS- PRN 10/02/16   Wilhelmina Mcardle, MD  fluticasone (FLONASE) 50 MCG/ACT nasal spray Place 2 sprays into both nostrils daily. 10/21/15   Wilhelmina Mcardle, MD  Nutritional Supplements (NUTRITIONAL DRINK PO) Take 325 mLs by mouth daily. [employer] Meal Replacement Shake 1.0 cal/ml (with fruit blended) Dairy-Free & Gluten Free    [provider]  OXYGEN Inhale 2 L into the lungs at bedtime.     [provider]  traZODone (DESYREL) 50 MG tablet take 1 tablet by mouth at bedtime if needed Patient taking differently: take 0.5 tablet (25 mg) by mouth at bedtime if needed for sleep 08/24/16   Wilhelmina Mcardle, MD  valACYclovir (VALTREX) 500 MG tablet Take 500 mg by mouth 2 (two) times a week. Tuesday & Friday    [provider]   Physical Exam: Vitals:   11/08/16 1028 11/08/16 1100 11/08/16 1200 11/08/16 1230  BP: (!) 154/86 (!) 145/86 (!) 143/90 (!) 154/91  Pulse: 88 87 80 78  Resp: 16 19 14 18   Temp:      TempSrc:      SpO2: 94% 93% 94% 93%  Weight:      Height:         General:  No apparent distress, WDWN, [place]/AT  Eyes: PERRL, EOMI, no scleral icterus, conjunctiva clear  ENT: moist oropharynx without exudate, TM's benign, dentition good  Neck: supple, no lymphadenopathy. No bruits or thyromegaly  Cardiovascular: regular rate without MRG; 2+ peripheral pulses, no JVD, no peripheral edema  Respiratory: right rhonchi without wheezes or rales. Some dullness at right base. Respiratory effort normal  Abdomen: soft, non tender to palpation, positive bowel sounds, no guarding, no rebound  Skin: no rashes or  lesions  Musculoskeletal: normal bulk and tone, no joint swelling  Psychiatric: normal mood and affect, A&OX3  Neurologic: CN 2-12 grossly intact, Motor strength 5/5 in all 4 groups with symmetric DTR's and non-focal sensory exam  Labs on Admission:  Basic Metabolic Panel: Recent Labs  Lab 11/08/16 0822  NA 134*  K 3.3*  CL 97*  CO2 27  GLUCOSE 123*  BUN 13  CREATININE 0.71  CALCIUM 8.8*   Liver Function Tests: Recent Labs  Lab 11/08/16 0822  AST 25  ALT 15  ALKPHOS 57  BILITOT 1.0  PROT 6.7  ALBUMIN 3.5   Recent Labs  Lab 11/08/16 0822  LIPASE 20   No results for input(s): AMMONIA in the last 168 hours. CBC: Recent Labs  Lab 11/08/16 0822  WBC 17.3*  HGB 12.4  HCT 37.1  MCV 97.0  PLT  177   Cardiac Enzymes: Recent Labs  Lab 11/08/16 0822  TROPONINI <0.03    BNP (last 3 results) No results for input(s): BNP in the last 8760 hours.  ProBNP (last 3 results) No results for input(s): PROBNP in the last 8760 hours.  CBG: No results for input(s): GLUCAP in the last 168 hours.  Radiological Exams on Admission: Dg Chest 2 View  Result Date: 11/08/2016 CLINICAL DATA:  Right chest pain. Pulmonary fibrosis. Cardiac murmur. EXAM: CHEST  2 VIEW COMPARISON:  05/18/2016 FINDINGS: Low lung volumes are present, causing crowding of the pulmonary vasculature. Abnormal chronic interstitial lung disease with superimposed acute interstitial accentuation. Tortuous and atherosclerotic thoracic aorta. Mild cardiomegaly. Blunted left posterior costophrenic angle. Thoracic and lumbar spondylosis. Dextroconvex thoracic scoliosis. A vague density projects over the left upper chest, possibly a skin fold. Retro diaphragmatic airspace opacity on the right. IMPRESSION: 1. Acute on chronic abnormal interstitial accentuation in the lungs, with associated cardiomegaly. The acute component could be due to edema or atypical pneumonia. 2. Retro diaphragmatic airspace opacity on the right,  possibly from pneumonia. 3.  Aortic Atherosclerosis (ICD10-I70.0).  Aortic tortuosity. 4. Vague density over the left upper chest space be simply due to soft tissues the chest wall, with some sort of indistinct infiltrate or pleural thickening is a differential diagnostic consideration. Electronically Signed   By: Van Clines M.D.   On: 11/08/2016 09:38   Ct Angio Chest Pe W/cm &/or Wo Cm  Result Date: 11/08/2016 CLINICAL DATA:  Arrives with c/o feeling ill since Friday, a headache that started yesterday, right back / rib pain with deep inspiration x 1 day, and feels of nausea that started today. Hx of pneumonia, rheumatoid arthritis, pulmonary fibrosis, nocturnal hypoxemia, left ventricular hypertrophy, heart murmur, GERD, also hx of smoking. Denies any hx of blood clots. EXAM: CT ANGIOGRAPHY CHEST WITH CONTRAST TECHNIQUE: Multidetector CT imaging of the chest was performed using the standard protocol during bolus administration of intravenous contrast. Multiplanar CT image reconstructions and MIPs were obtained to evaluate the vascular anatomy. CONTRAST:  75 mL of Isovue 370 intravenous contrast COMPARISON:  Current chest radiographs. High-resolution chest CT, 05/07/2015. Chest CT 03/18/2015. FINDINGS: Cardiovascular: Satisfactory opacification of the pulmonary arteries to the segmental level. No evidence of pulmonary embolism. Heart is mildly enlarged. No coronary artery calcifications. Ascending aorta measures 4 cm in diameter. There is minor atherosclerotic plaque along the arch and descending thoracic aorta. Mediastinum/Nodes: No neck base, axillary, mediastinal or hilar masses or enlarged lymph nodes. Trachea is patent. Esophagus unremarkable other than a small hiatal hernia. Lungs/Pleura: Focal consolidation noted in the posterior right lower lobe of the lung base. Although this may be atelectasis, pneumonia should be suspected in the proper clinical setting. No other evidence of pneumonia. No  evidence of pulmonary edema. Peripheral interstitial thickening is stable reflecting fibrosis. No lung masses or suspicious nodules. No pleural effusion or pneumothorax. Upper Abdomen: No acute abnormality. Musculoskeletal: No acute fracture. No osteoblastic or osteolytic lesions. Review of the MIP images confirms the above findings. IMPRESSION: 1. No evidence of a pulmonary embolism. 2. Focal opacity in the posterior right lower lobe. Suspect pneumonia if there are consistent clinical findings. Alternatively, this could reflect atelectasis. 3. No other evidence of an acute abnormality. 4. Mild cardiomegaly. 5. Mild aortic atherosclerosis. 6. Mild dilation of the ascending aorta. Recommend annual imaging followup by CTA or MRA. This recommendation follows 2010 ACCF/AHA/AATS/ACR/ASA/SCA/SCAI/SIR/STS/SVM Guidelines for the Diagnosis and Management of Patients with Thoracic Aortic Disease. Circulation. 2010; 121:  B169-I503 Aortic Atherosclerosis (ICD10-I70.0). Electronically Signed   By: Lajean Manes M.D.   On: 11/08/2016 12:20    EKG: Independently reviewed.  Assessment/Plan Principal Problem:   CAP (community acquired pneumonia) Active Problems:   Depression   ILD (interstitial lung disease) ([place])   Rheumatoid arthritis of multiple sites with negative rheumatoid factor ([place])   Will observe on floor with IV ABX, SVN's, and Mucinex. IV fluids with K+ started. Repeat labs in AM  Diet: regular Fluids: NS with K+ DVT Prophylaxis: SQ Heparin  Code Status: FULL  Family Communication: yes  Disposition Plan: home  Time spent: 50 min

## 2016-11-08 NOTE — ED Triage Notes (Signed)
Arrives with c/o feeling ill since Friday, a headache that started yesterday, right back / rib pain with deep inspiration x 1 day, and feels of nausea that started today.

## 2016-11-09 DIAGNOSIS — J189 Pneumonia, unspecified organism: Secondary | ICD-10-CM | POA: Diagnosis not present

## 2016-11-09 LAB — CBC
HCT: 34.4 % — ABNORMAL LOW (ref 35.0–47.0)
Hemoglobin: 11.3 g/dL — ABNORMAL LOW (ref 12.0–16.0)
MCH: 32.1 pg (ref 26.0–34.0)
MCHC: 32.8 g/dL (ref 32.0–36.0)
MCV: 98 fL (ref 80.0–100.0)
PLATELETS: 156 10*3/uL (ref 150–440)
RBC: 3.51 MIL/uL — ABNORMAL LOW (ref 3.80–5.20)
RDW: 13.3 % (ref 11.5–14.5)
WBC: 12.5 10*3/uL — ABNORMAL HIGH (ref 3.6–11.0)

## 2016-11-09 LAB — COMPREHENSIVE METABOLIC PANEL
ALBUMIN: 3 g/dL — AB (ref 3.5–5.0)
ALK PHOS: 42 U/L (ref 38–126)
ALT: 12 U/L — AB (ref 14–54)
AST: 18 U/L (ref 15–41)
Anion gap: 5 (ref 5–15)
BUN: 8 mg/dL (ref 6–20)
CALCIUM: 8.3 mg/dL — AB (ref 8.9–10.3)
CO2: 26 mmol/L (ref 22–32)
CREATININE: 0.67 mg/dL (ref 0.44–1.00)
Chloride: 107 mmol/L (ref 101–111)
GFR calc Af Amer: >60 mL/min (ref >60)
GFR calc non Af Amer: >60 mL/min (ref >60)
GLUCOSE: 95 mg/dL (ref 65–99)
Potassium: 3.9 mmol/L (ref 3.5–5.1)
SODIUM: 138 mmol/L (ref 135–145)
Total Bilirubin: 1.3 mg/dL — ABNORMAL HIGH (ref 0.3–1.2)
Total Protein: 5.7 g/dL — ABNORMAL LOW (ref 6.5–8.1)

## 2016-11-09 LAB — GLUCOSE, CAPILLARY: GLUCOSE-CAPILLARY: 108 mg/dL — AB (ref 65–99)

## 2016-11-09 MED ORDER — ENSURE ENLIVE PO LIQD
237.0000 mL | ORAL | Status: DC
  Administered 2016-11-09 – 2016-11-10 (×2): 237 mL via ORAL

## 2016-11-09 MED ORDER — ENOXAPARIN SODIUM 40 MG/0.4ML ~~LOC~~ SOLN
40.0000 mg | SUBCUTANEOUS | Status: DC
  Administered 2016-11-09: 21:00:00 40 mg via SUBCUTANEOUS
  Filled 2016-11-09 (×2): qty 0.4

## 2016-11-09 MED ORDER — HYDROCOD POLST-CPM POLST ER 10-8 MG/5ML PO SUER
5.0000 mL | Freq: Two times a day (BID) | ORAL | Status: DC
  Administered 2016-11-09 – 2016-11-11 (×4): 5 mL via ORAL
  Filled 2016-11-09 (×5): qty 5

## 2016-11-09 NOTE — Care Management (Signed)
Patient admitted from home with CAP.  PCP [facility].  Patient currently on RA.  Wears chronic nocturnal O2 through [place].  Patient states that she is independent at baseline. No other home medical equipment.  No RNCM needs identified at this time

## 2016-11-09 NOTE — Care Management Obs Status (Signed)
[facility] NOTIFICATION   Patient Details  Name: Stephanie Patel MRN: [id number] Date of Birth: June 17, 1945   Medicare Observation Status Notification Given:  Yes    Beverly Sessions, RN 11/09/2016, 2:25 PM

## 2016-11-09 NOTE — Progress Notes (Signed)
[facility] [place] NAME: Stephanie Patel    MR#:  [id number]  DATE OF BIRTH:  09-02-1945  SUBJECTIVE:  CHIEF COMPLAINT:   Chief Complaint  Patient presents with  . Nausea  . Headache   - Still has cough, breathing is improving -Right-sided pleuritic chest pain improving  REVIEW OF SYSTEMS:  Review of Systems  Constitutional: Negative for chills and fever.  HENT: Negative for congestion, ear discharge, hearing loss and nosebleeds.   Eyes: Negative for blurred vision and double vision.  Respiratory: Positive for cough. Negative for shortness of breath and wheezing.   Cardiovascular: Positive for chest pain. Negative for palpitations.       Pleuritic right sided chest pain  Gastrointestinal: Negative for abdominal pain, constipation, diarrhea, nausea and vomiting.  Genitourinary: Negative for dysuria.  Musculoskeletal: Negative for myalgias.  Neurological: Negative for dizziness, speech change, focal weakness, seizures and headaches.  Psychiatric/Behavioral: Negative for depression.    DRUG ALLERGIES:   Allergies  Allergen Reactions  . Codeine Other (See Comments)    Tolerates with anti-nausea medication (TD:[id number])  . Keflex [Cephalexin] Hives    VITALS:  Blood pressure (!) 145/81, pulse 78, temperature 98.4 F (36.9 C), temperature source Oral, resp. rate 18, height 5' (1.524 m), weight 57.3 kg (126 lb 4.8 oz), SpO2 92 %.  PHYSICAL EXAMINATION:  Physical Exam  GENERAL:  71 y.o.-year-old patient lying in the bed with no acute distress.  EYES: Pupils equal, round, reactive to light and accommodation. No scleral icterus. Extraocular muscles intact.  HEENT: Head atraumatic, normocephalic. Oropharynx and nasopharynx clear.  NECK:  Supple, no jugular venous distention. No thyroid enlargement, no tenderness.  LUNGS: Moving air bilaterally, significant crackles all over the lung fields posteriorly due to her interstitial lung  disease. No use of accessory muscles of respiration.  CARDIOVASCULAR: S1, S2 normal. No murmurs, rubs, or gallops.  ABDOMEN: Soft, nontender, nondistended. Bowel sounds present. No organomegaly or mass.  EXTREMITIES: No pedal edema, cyanosis, or clubbing.  NEUROLOGIC: Cranial nerves II through XII are intact. Muscle strength 5/5 in all extremities. Sensation intact. Gait not checked.  PSYCHIATRIC: The patient is alert and oriented x 3.  SKIN: No obvious rash, lesion, or ulcer.    LABORATORY PANEL:   CBC Recent Labs  Lab 11/09/16 0423  WBC 12.5*  HGB 11.3*  HCT 34.4*  PLT 156   ------------------------------------------------------------------------------------------------------------------  Chemistries  Recent Labs  Lab 11/09/16 0423  NA 138  K 3.9  CL 107  CO2 26  GLUCOSE 95  BUN 8  CREATININE 0.67  CALCIUM 8.3*  AST 18  ALT 12*  ALKPHOS 42  BILITOT 1.3*   ------------------------------------------------------------------------------------------------------------------  Cardiac Enzymes Recent Labs  Lab 11/08/16 0822  TROPONINI <0.03   ------------------------------------------------------------------------------------------------------------------  RADIOLOGY:  Dg Chest 2 View  Result Date: 11/08/2016 CLINICAL DATA:  Right chest pain. Pulmonary fibrosis. Cardiac murmur. EXAM: CHEST  2 VIEW COMPARISON:  05/18/2016 FINDINGS: Low lung volumes are present, causing crowding of the pulmonary vasculature. Abnormal chronic interstitial lung disease with superimposed acute interstitial accentuation. Tortuous and atherosclerotic thoracic aorta. Mild cardiomegaly. Blunted left posterior costophrenic angle. Thoracic and lumbar spondylosis. Dextroconvex thoracic scoliosis. A vague density projects over the left upper chest, possibly a skin fold. Retro diaphragmatic airspace opacity on the right. IMPRESSION: 1. Acute on chronic abnormal interstitial accentuation in the lungs,  with associated cardiomegaly. The acute component could be due to edema or atypical pneumonia. 2. Retro diaphragmatic airspace opacity on  the right, possibly from pneumonia. 3.  Aortic Atherosclerosis (ICD10-I70.0).  Aortic tortuosity. 4. Vague density over the left upper chest space be simply due to soft tissues the chest wall, with some sort of indistinct infiltrate or pleural thickening is a differential diagnostic consideration. Electronically Signed   By: Van Clines M.D.   On: 11/08/2016 09:38   Ct Angio Chest Pe W/cm &/or Wo Cm  Result Date: 11/08/2016 CLINICAL DATA:  Arrives with c/o feeling ill since Friday, a headache that started yesterday, right back / rib pain with deep inspiration x 1 day, and feels of nausea that started today. Hx of pneumonia, rheumatoid arthritis, pulmonary fibrosis, nocturnal hypoxemia, left ventricular hypertrophy, heart murmur, GERD, also hx of smoking. Denies any hx of blood clots. EXAM: CT ANGIOGRAPHY CHEST WITH CONTRAST TECHNIQUE: Multidetector CT imaging of the chest was performed using the standard protocol during bolus administration of intravenous contrast. Multiplanar CT image reconstructions and MIPs were obtained to evaluate the vascular anatomy. CONTRAST:  75 mL of Isovue 370 intravenous contrast COMPARISON:  Current chest radiographs. High-resolution chest CT, 05/07/2015. Chest CT 03/18/2015. FINDINGS: Cardiovascular: Satisfactory opacification of the pulmonary arteries to the segmental level. No evidence of pulmonary embolism. Heart is mildly enlarged. No coronary artery calcifications. Ascending aorta measures 4 cm in diameter. There is minor atherosclerotic plaque along the arch and descending thoracic aorta. Mediastinum/Nodes: No neck base, axillary, mediastinal or hilar masses or enlarged lymph nodes. Trachea is patent. Esophagus unremarkable other than a small hiatal hernia. Lungs/Pleura: Focal consolidation noted in the posterior right lower lobe of  the lung base. Although this may be atelectasis, pneumonia should be suspected in the proper clinical setting. No other evidence of pneumonia. No evidence of pulmonary edema. Peripheral interstitial thickening is stable reflecting fibrosis. No lung masses or suspicious nodules. No pleural effusion or pneumothorax. Upper Abdomen: No acute abnormality. Musculoskeletal: No acute fracture. No osteoblastic or osteolytic lesions. Review of the MIP images confirms the above findings. IMPRESSION: 1. No evidence of a pulmonary embolism. 2. Focal opacity in the posterior right lower lobe. Suspect pneumonia if there are consistent clinical findings. Alternatively, this could reflect atelectasis. 3. No other evidence of an acute abnormality. 4. Mild cardiomegaly. 5. Mild aortic atherosclerosis. 6. Mild dilation of the ascending aorta. Recommend annual imaging followup by CTA or MRA. This recommendation follows 2010 ACCF/AHA/AATS/ACR/ASA/SCA/SCAI/SIR/STS/SVM Guidelines for the Diagnosis and Management of Patients with Thoracic Aortic Disease. Circulation. 2010; 121: Q034-V425 Aortic Atherosclerosis (ICD10-I70.0). Electronically Signed   By: Lajean Manes M.D.   On: 11/08/2016 12:20    EKG:   Orders placed or performed during the hospital encounter of 11/08/16  . ED EKG  . ED EKG    ASSESSMENT AND PLAN:   71 year old female with past medical history significant for interstitial lung disease secondary to rheumatoid arthritis, hypertension, anxiety and depression presents to the hospital secondary to community-acquired pneumonia  #1 community-acquired pneumonia- follow blood cultures -Right lower lobe infiltrate noted on chest x-ray. Not requiring oxygen. -Uses nocturnal home O2 at baseline. WBC is improving -No indication for steroids at this time. Continue Levaquin for one more day -DC IV fluids - cough meds added  #2 interstitial lung disease-follows with pulmonary as outpatient -Not on any steroids  chronically. Due to underlying rheumatoid arthritis -No indication for steroids at this time. Continue inhalers  #3 rheumatoid arthritis-follows with rheumatology -On Humira, Plaquenil and Arava  #4 depression and anxiety-continue home medications-on Effexor, bupropion, BuSpar  #5 DVT prophylaxis-changed to Lovenox  Encourage  ambulation   All the records are reviewed and case discussed with Care Management/Social Workerr. Management plans discussed with the patient, family and they are in agreement.  CODE STATUS: Full code  TOTAL TIME TAKING CARE OF THIS PATIENT: 35 minutes.   POSSIBLE D/C tomorrow, DEPENDING ON CLINICAL CONDITION.   Gladstone Lighter M.D on 11/09/2016 at 7:37 AM  Between 7am to 6pm - Pager - [phone]  After 6pm go to [email or web address] - password [email or web address] [facility]  Office  [phone]  CC: Primary care physician; Idelle Crouch, MD

## 2016-11-09 NOTE — Progress Notes (Addendum)
Initial Nutrition Assessment  DOCUMENTATION CODES:   Not applicable  INTERVENTION:  Provide Ensure Enlive po once daily, each supplement provides 350 kcal and 20 grams of protein.  Encouraged adequate intake of calories and protein at meals.  NUTRITION DIAGNOSIS:   Inadequate oral intake related to decreased appetite as evidenced by per patient/family report.  GOAL:   Patient will meet greater than or equal to 90% of their needs  MONITOR:   PO intake, Supplement acceptance, Labs, Weight trends, I & O's  REASON FOR ASSESSMENT:   Malnutrition Screening Tool    ASSESSMENT:   70 year old female with PMHx of left ventricular hypertrophy, IBS, depression, pulmonary fibrosis, GERD, interstitial lung disease secondary to rheumatoid lung who is now admitted with PNA.   Met with patient at bedside. Patient reports she has had a decreased appetite since the end of last week. She reports eating 25-50% of meals now. She typically eats well. She reports that last year she was hospitalized with a similar infection and she lost a lot of weight. She reports she lost down to 105 lbs lat year. She then gained up to 125 lbs and reports she has been weight stable at that weight for the past year. Verified in chart patient has been weight stable for the past year. Did not see a weight as low as 105 lbs last year. She drinks a [employer] [place] protein shake daily at home and is lactose-intolerant. Patient is amenable to drink Ensure here but only wants to drink one per day.   Medications reviewed and include: Colace, pantoprazole, Levaquin.  Labs reviewed: CBG 108.  Patient is at risk for malnutrition but does not meet criteria for malnutrition at this time.  NUTRITION - FOCUSED PHYSICAL EXAM:    Most Recent Value  Orbital Region  No depletion  Upper Arm Region  Mild depletion  Thoracic and Lumbar Region  No depletion  Buccal Region  No depletion  Temple Region  Moderate depletion  Clavicle Bone  Region  Mild depletion  Clavicle and Acromion Bone Region  Mild depletion  Scapular Bone Region  Mild depletion  Dorsal Hand  Mild depletion  Patellar Region  Mild depletion  Anterior Thigh Region  Mild depletion  Posterior Calf Region  Mild depletion  Edema (RD Assessment)  None  Hair  Reviewed  Eyes  Reviewed  Mouth  Reviewed  Skin  Reviewed  Nails  Reviewed     Diet Order:  Diet regular Room service appropriate? Yes; Fluid consistency: Thin  EDUCATION NEEDS:   No education needs have been identified at this time  Skin:  Skin Assessment: Skin Integrity Issues: Skin Integrity Issues:: Incisions Incisions: closed incisions to head and groin  Last BM:  11/08/2016  Height:   Ht Readings from Last 1 Encounters:  11/08/16 5' (1.524 m)    Weight:   Wt Readings from Last 1 Encounters:  11/09/16 126 lb 4.8 oz (57.3 kg)    Ideal Body Weight:  45.5 kg  BMI:  Body mass index is 24.67 kg/m.  Estimated Nutritional Needs:   Kcal:  1315-1515 (MSJ x 1.3-1.5)  Protein:  70-80 grams (1.2-1.4 grams/kg)  Fluid:  1.4-1.7 L/day (25-30 mL/kg)  Willey Blade, MS, RD, LDN Office: [phone] Pager: [phone] After Hours/Weekend Pager: [phone]

## 2016-11-10 DIAGNOSIS — J189 Pneumonia, unspecified organism: Secondary | ICD-10-CM | POA: Diagnosis not present

## 2016-11-10 LAB — BASIC METABOLIC PANEL
Anion gap: 6 (ref 5–15)
BUN: 13 mg/dL (ref 6–20)
CHLORIDE: 104 mmol/L (ref 101–111)
CO2: 27 mmol/L (ref 22–32)
Calcium: 8.4 mg/dL — ABNORMAL LOW (ref 8.9–10.3)
Creatinine, Ser: 0.75 mg/dL (ref 0.44–1.00)
GFR calc Af Amer: >60 mL/min (ref >60)
GFR calc non Af Amer: >60 mL/min (ref >60)
Glucose, Bld: 123 mg/dL — ABNORMAL HIGH (ref 65–99)
POTASSIUM: 3.6 mmol/L (ref 3.5–5.1)
Sodium: 137 mmol/L (ref 135–145)

## 2016-11-10 MED ORDER — IPRATROPIUM-ALBUTEROL 0.5-2.5 (3) MG/3ML IN SOLN
3.0000 mL | Freq: Three times a day (TID) | RESPIRATORY_TRACT | Status: DC
  Administered 2016-11-10 – 2016-11-11 (×2): 3 mL via RESPIRATORY_TRACT
  Filled 2016-11-10 (×3): qty 3

## 2016-11-10 MED ORDER — BENZONATATE 100 MG PO CAPS
200.0000 mg | ORAL_CAPSULE | Freq: Three times a day (TID) | ORAL | Status: DC
  Administered 2016-11-10 – 2016-11-11 (×3): 200 mg via ORAL
  Filled 2016-11-10 (×3): qty 2

## 2016-11-10 MED ORDER — LEVOFLOXACIN 750 MG PO TABS
750.0000 mg | ORAL_TABLET | Freq: Every day | ORAL | Status: DC
  Administered 2016-11-11: 09:00:00 750 mg via ORAL
  Filled 2016-11-10: qty 1

## 2016-11-10 NOTE — Plan of Care (Signed)
Pt is progressing toward goals.  She has ambulated in hallway with no assistance.   Pt  states she will be  ready for discharge in the am.

## 2016-11-10 NOTE — Progress Notes (Signed)
[facility] [place] NAME: Stephanie Patel    MR#:  [id number]  DATE OF BIRTH:  1945-04-14  SUBJECTIVE:  CHIEF COMPLAINT:   Chief Complaint  Patient presents with  . Nausea  . Headache   - Chest pain has improved significantly. Still has significant cough and weakness.  REVIEW OF SYSTEMS:  Review of Systems  Constitutional: Positive for malaise/fatigue. Negative for chills and fever.  HENT: Negative for congestion, ear discharge, hearing loss and nosebleeds.   Eyes: Negative for blurred vision and double vision.  Respiratory: Positive for cough. Negative for shortness of breath and wheezing.   Cardiovascular: Negative for chest pain and palpitations.       Pleuritic right sided chest pain  Gastrointestinal: Negative for abdominal pain, constipation, diarrhea, nausea and vomiting.  Genitourinary: Negative for dysuria.  Musculoskeletal: Negative for myalgias.  Neurological: Negative for dizziness, speech change, focal weakness, seizures and headaches.  Psychiatric/Behavioral: Negative for depression.    DRUG ALLERGIES:   Allergies  Allergen Reactions  . Codeine Other (See Comments)    Tolerates with anti-nausea medication (GX:QJJHERDEYC)  . Keflex [Cephalexin] Hives    VITALS:  Blood pressure 120/74, pulse 86, temperature 98 F (36.7 C), temperature source Oral, resp. rate 18, height 5' (1.524 m), weight 56.9 kg (125 lb 6.4 oz), SpO2 96 %.  PHYSICAL EXAMINATION:  Physical Exam  GENERAL:  71 y.o.-year-old patient lying in the bed with no acute distress.  EYES: Pupils equal, round, reactive to light and accommodation. No scleral icterus. Extraocular muscles intact.  HEENT: Head atraumatic, normocephalic. Oropharynx and nasopharynx clear.  NECK:  Supple, no jugular venous distention. No thyroid enlargement, no tenderness.  LUNGS: Moving air bilaterally, only crackles at the bases posteriorly due to her interstitial lung disease.  No use of accessory muscles of respiration.  CARDIOVASCULAR: S1, S2 normal. No murmurs, rubs, or gallops.  ABDOMEN: Soft, nontender, nondistended. Bowel sounds present. No organomegaly or mass.  EXTREMITIES: No pedal edema, cyanosis, or clubbing.  NEUROLOGIC: Cranial nerves II through XII are intact. Muscle strength 5/5 in all extremities. Sensation intact. Gait not checked.  PSYCHIATRIC: The patient is alert and oriented x 3.  SKIN: No obvious rash, lesion, or ulcer.    LABORATORY PANEL:   CBC Recent Labs  Lab 11/09/16 0423  WBC 12.5*  HGB 11.3*  HCT 34.4*  PLT 156   ------------------------------------------------------------------------------------------------------------------  Chemistries  Recent Labs  Lab 11/09/16 0423 11/10/16 0538  NA 138 137  K 3.9 3.6  CL 107 104  CO2 26 27  GLUCOSE 95 123*  BUN 8 13  CREATININE 0.67 0.75  CALCIUM 8.3* 8.4*  AST 18  --   ALT 12*  --   ALKPHOS 42  --   BILITOT 1.3*  --    ------------------------------------------------------------------------------------------------------------------  Cardiac Enzymes Recent Labs  Lab 11/08/16 0822  TROPONINI <0.03   ------------------------------------------------------------------------------------------------------------------  RADIOLOGY:  No results found.  EKG:   Orders placed or performed during the hospital encounter of 11/08/16  . ED EKG  . 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 Not requiring oxygen. -Uses nocturnal home O2 at baseline. WBC is improving -No indication for steroids at this time. Continue Levaquin -  cough meds added  #2  interstitial lung disease-follows with pulmonary as outpatient -Not on any steroids chronically. Due to underlying rheumatoid arthritis -No indication for steroids at this time. Continue inhalers  #3 rheumatoid arthritis-follows with rheumatology -On Humira, Plaquenil and Arava  #4 depression and anxiety-continue home medications-on Effexor, bupropion, BuSpar  #5 DVT prophylaxis-changed to Lovenox  Encourage ambulation   All the records are reviewed and case discussed with Care Management/Social Workerr. Management plans discussed with the patient, family and they are in agreement.  CODE STATUS: Full code  TOTAL TIME TAKING CARE OF THIS PATIENT: 33 minutes.   POSSIBLE D/C tomorrow, DEPENDING ON CLINICAL CONDITION.   Gladstone Lighter M.D on 11/10/2016 at 2:40 PM  Between 7am to 6pm - Pager - [phone]  After 6pm go to [email or web address] - password [email or web address] [facility]  Office  [phone]  CC: Primary care physician; Idelle Crouch, MD

## 2016-11-11 DIAGNOSIS — J189 Pneumonia, unspecified organism: Secondary | ICD-10-CM | POA: Diagnosis not present

## 2016-11-11 LAB — CBC
HCT: 32.1 % — ABNORMAL LOW (ref 35.0–47.0)
Hemoglobin: 10.7 g/dL — ABNORMAL LOW (ref 12.0–16.0)
MCH: 32.5 pg (ref 26.0–34.0)
MCHC: 33.2 g/dL (ref 32.0–36.0)
MCV: 97.9 fL (ref 80.0–100.0)
PLATELETS: 169 10*3/uL (ref 150–440)
RBC: 3.28 MIL/uL — AB (ref 3.80–5.20)
RDW: 13.6 % (ref 11.5–14.5)
WBC: 6.5 10*3/uL (ref 3.6–11.0)

## 2016-11-11 LAB — GLUCOSE, CAPILLARY: Glucose-Capillary: 89 mg/dL (ref 65–99)

## 2016-11-11 MED ORDER — BENZONATATE 200 MG PO CAPS: 200.0000 mg | ORAL_CAPSULE | Freq: Three times a day (TID) | ORAL | 0 refills | Status: DC

## 2016-11-11 MED ORDER — GUAIFENESIN ER 600 MG PO TB12: 600.0000 mg | ORAL_TABLET | Freq: Two times a day (BID) | ORAL | 0 refills | Status: DC

## 2016-11-11 MED ORDER — LEVOFLOXACIN 750 MG PO TABS: 750.0000 mg | ORAL_TABLET | Freq: Every day | ORAL | 0 refills | Status: DC

## 2016-11-11 MED ORDER — HYDROCOD POLST-CPM POLST ER 10-8 MG/5ML PO SUER: 5.0000 mL | Freq: Two times a day (BID) | ORAL | 0 refills | Status: DC

## 2016-11-11 NOTE — Discharge Summary (Signed)
[facility] [place] NAME: Stephanie Patel    MR#:  [id number]  DATE OF BIRTH:  28-Oct-1945  DATE OF ADMISSION:  11/08/2016   ADMITTING PHYSICIAN: Idelle Crouch, MD  DATE OF DISCHARGE: 11/11/16  PRIMARY CARE PHYSICIAN: Idelle Crouch, MD   ADMISSION DIAGNOSIS:   Community acquired pneumonia of right lung, unspecified part of lung [J18.9]  DISCHARGE DIAGNOSIS:   Principal Problem:   CAP (community acquired pneumonia) Active Problems:   Depression   ILD (interstitial lung disease) ([place])   Rheumatoid arthritis of multiple sites with negative rheumatoid factor ([place])   SECONDARY DIAGNOSIS:   Past Medical History:  Diagnosis Date  . 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 Chronic cough    USES TESSALON PEARLS PRN-NEVER PRODUCTIVE COUGH, ONLY DRY  . Depression   . GERD (gastroesophageal reflux disease)   . Heart murmur   . IBS (irritable bowel syndrome)   . LVH (left ventricular hypertrophy)   . Nocturnal hypoxemia    ON O2 @ 2 LITERS [place] ONLY AT NIGHT  . 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 Not requiring oxygen. -Uses nocturnal home O2 at baseline. WBC is improved -No indication for steroids at this time. Continue Levaquin - cough meds added  #2 interstitial lung disease-follows with pulmonary as outpatient -Not on any steroids chronically. Due to underlying rheumatoid arthritis -No indication for steroids at this time. Continue inhalers  #3 rheumatoid arthritis-follows with rheumatology -On Humira, Plaquenil and  Arava  #4 depression and anxiety-continue home medications-on Effexor, bupropion, BuSpar  Ambulating well, discharge today   DISCHARGE CONDITIONS:   Guarded  CONSULTS OBTAINED:   none  DRUG ALLERGIES:   Allergies  Allergen Reactions  . Codeine Other (See Comments)    Tolerates with anti-nausea medication (FO:YDXAJOINOM)  . Keflex [Cephalexin] Hives   DISCHARGE MEDICATIONS:   Allergies as of 11/11/2016      Reactions   Codeine Other (See Comments)   Tolerates with anti-nausea medication (VE:HMCNOBSJGG)   Keflex [cephalexin] Hives      Medication List    STOP taking these medications   HUMIRA 40 MG/0.4ML Pskt Generic drug:  Adalimumab     TAKE these medications   acidophilus Caps capsule Take 1 capsule by mouth daily.   benzonatate 200 MG capsule Commonly known as:  TESSALON Take 1 capsule (200 mg total) 3 (three) times daily by mouth. What changed:  See the new instructions.   BREO ELLIPTA 100-25 MCG/INH Aepb Generic drug:  fluticasone furoate-vilanterol inhale 1 puff by mouth INTO THE LUNGS once daily What changed:  See the new instructions.   buPROPion 300 MG 24 hr tablet Commonly known as:  WELLBUTRIN XL Take 300 mg by mouth every morning.   busPIRone 15 MG tablet Commonly known as:  BUSPAR Take 15 mg by mouth 2 (two) times daily.   CALCIUM 1200 PO Take 1 tablet by mouth at bedtime.   cetirizine 10 MG tablet Commonly known as:  ZYRTEC ALLERGY Take 1 tablet (10 mg total) by mouth daily.   chlorpheniramine-HYDROcodone 10-8 MG/5ML Suer Commonly known as:  TUSSIONEX Take 5 mLs every 12 (twelve) hours by mouth.   desvenlafaxine 50 MG 24 hr tablet Commonly known as:  PRISTIQ Take 50 mg by mouth every morning.   dicyclomine 20 MG tablet Commonly known as:  BENTYL Take 20 mg 3 (three) times daily before meals by mouth. Take 1 tablet by mouth two to three times daily   fluticasone 50 MCG/ACT nasal spray Commonly known as:  FLONASE Place 2  sprays into both nostrils daily.   guaiFENesin 600 MG 12 hr tablet Commonly known as:  MUCINEX Take 1 tablet (600 mg total) 2 (two) times daily by mouth.   hydroxychloroquine 200 MG tablet Commonly known as:  PLAQUENIL Take 200 mg by mouth 2 (two) times daily.   leflunomide 20 MG tablet Commonly known as:  ARAVA Take 20 mg by mouth every morning.   levofloxacin 750 MG tablet Commonly known as:  LEVAQUIN Take 1 tablet (750 mg total) daily by mouth. X 6 days   NUTRITIONAL DRINK PO Take 325 mLs by mouth daily. [employer] Meal Replacement Shake 1.0 cal/ml (with fruit blended) Dairy-Free & Gluten Free   omeprazole 40 MG capsule Commonly known as:  PRILOSEC Take 40 mg by mouth daily before breakfast.   OXYGEN Inhale 2 L into the lungs at bedtime.   PREMARIN vaginal cream Generic drug:  conjugated estrogens Place 0.625 mg vaginally 2 (two) times a week.   tolterodine 4 MG 24 hr capsule Commonly known as:  DETROL LA Take 4 mg by mouth daily.   traZODone 50 MG tablet Commonly known as:  DESYREL take 1 tablet by mouth at bedtime if needed What changed:  See the new instructions.   valACYclovir 500 MG tablet Commonly known as:  VALTREX Take 500 mg by mouth 2 (two) times a week. Tuesday & Friday        DISCHARGE INSTRUCTIONS:   1. PCP f/u in 1-2 weeks 2. Pulmonary f/u in 2 weeks 3. Rheumatology f/u as scheduled  DIET:   Cardiac diet  ACTIVITY:   Activity as tolerated  OXYGEN:   Home Oxygen: Yes.    Oxygen Delivery: 2l nocturnal o2  DISCHARGE LOCATION:   home   If you experience worsening of your admission symptoms, develop shortness of breath, life threatening emergency, suicidal or homicidal thoughts you must seek medical attention immediately by calling 911 or calling your MD immediately  if symptoms less severe.  You Must read complete instructions/literature along with all the possible adverse reactions/side effects for all the Medicines you take and  that have been prescribed to you. Take any new Medicines after you have completely understood and accpet all the possible adverse reactions/side effects.   Please note  You were cared for by a hospitalist during your hospital stay. If you have any questions about your discharge medications or the care you received while you were in the hospital after you are discharged, you can call the unit and asked to speak with the hospitalist on call if the hospitalist that took care of you is not available. Once you are discharged, your primary care physician will handle any further medical issues. Please note that NO REFILLS for any discharge medications will be authorized once you are discharged, as it is imperative that you return to your primary care physician (or establish a relationship with a primary care physician if you do not have one) for your aftercare needs so that they can reassess your need for medications and monitor your lab values.  On the day of Discharge:  VITAL SIGNS:   Blood pressure 112/68, pulse 71, temperature 98.9 F (37.2 C), temperature source Oral, resp. rate 18, height 5' (1.524 m), weight 56.9 kg (125 lb 6.4 oz), SpO2 95 %.  PHYSICAL EXAMINATION:    GENERAL:  71 y.o.-year-old patient lying in the bed with no acute distress.  EYES: Pupils equal, round, reactive to light and accommodation. No scleral icterus. Extraocular muscles intact.  HEENT: Head atraumatic, normocephalic. Oropharynx and nasopharynx clear.  NECK:  Supple, no jugular venous distention. No thyroid enlargement, no tenderness.  LUNGS: Moving air bilaterally, only crackles at the bases posteriorly due to her interstitial lung disease. No use of accessory muscles of respiration.  CARDIOVASCULAR: S1, S2 normal. No murmurs, rubs, or gallops.  ABDOMEN: Soft, nontender, nondistended. Bowel sounds present. No organomegaly or mass.  EXTREMITIES: No pedal edema, cyanosis, or clubbing.  NEUROLOGIC: Cranial nerves II  through XII are intact. Muscle strength 5/5 in all extremities. Sensation intact. Gait not checked.  PSYCHIATRIC: The patient is alert and oriented x 3.  SKIN: No obvious rash, lesion, or ulcer.     DATA REVIEW:   CBC Recent Labs  Lab 11/11/16 0518  WBC 6.5  HGB 10.7*  HCT 32.1*  PLT 169    Chemistries  Recent Labs  Lab 11/09/16 0423 11/10/16 0538  NA 138 137  K 3.9 3.6  CL 107 104  CO2 26 27  GLUCOSE 95 123*  BUN 8 13  CREATININE 0.67 0.75  CALCIUM 8.3* 8.4*  AST 18  --   ALT 12*  --   ALKPHOS 42  --   BILITOT 1.3*  --      Microbiology Results  Results for orders placed or performed during the hospital encounter of 11/08/16  Culture, blood (routine x 2)     Status: None (Preliminary result)   Collection Time: 11/08/16 10:55 AM  Result Value Ref Range Status   Specimen Description BLOOD RIGHT ARM  Final   Special Requests   Final    BOTTLES DRAWN AEROBIC AND ANAEROBIC Blood Culture adequate volume   Culture NO GROWTH 3 DAYS  Final   Report Status PENDING  Incomplete  Culture, blood (routine x 2)     Status: None (Preliminary result)   Collection Time: 11/08/16 11:02 AM  Result Value Ref Range Status   Specimen Description BLOOD LEFT ASSIST CONTROL  Final   Special Requests   Final    BOTTLES DRAWN AEROBIC AND ANAEROBIC Blood Culture results may not be optimal due to an excessive volume of blood received in culture bottles   Culture NO GROWTH 3 DAYS  Final   Report Status PENDING  Incomplete    RADIOLOGY:  No results found.   Management plans discussed with the patient, family and they are in agreement.  CODE STATUS:     Code Status Orders  (From admission, onward)        Start     Ordered   11/08/16 1546  Full code  Continuous     11/08/16 1545    Code Status History    Date Active Date Inactive Code Status Order ID Comments User Context   03/18/2015 20:53 03/25/2015 19:17 Full Code [id number]  Henreitta Leber, MD Inpatient    Advance  Directive Documentation     Most Recent Value  Type of Advance Directive  Living will  Pre-existing out of facility DNR order (yellow form or pink MOST form)  No data  "  MOST" Form in Place?  No data      TOTAL TIME TAKING CARE OF THIS PATIENT: 37 minutes.    Gladstone Lighter M.D on 11/11/2016 at 8:20 AM  Between 7am to 6pm - Pager - [phone]  After 6pm go to [email or web address] - Proofreader  [facility]  Office  [phone]  CC: Primary care physician; Idelle Crouch, MD   Note: This dictation was prepared with Dragon dictation along with smaller phrase technology. Any transcriptional errors that result from this process are unintentional.

## 2016-11-13 LAB — CULTURE, BLOOD (ROUTINE X 2)
Culture: NO GROWTH
Culture: NO GROWTH
SPECIAL REQUESTS: ADEQUATE

## 2016-11-19 ENCOUNTER — Other Ambulatory Visit: Payer: Self-pay | Admitting: Pulmonary Disease

## 2016-11-19 NOTE — Telephone Encounter (Signed)
May we refill the tessalon? Pt was recently in admitted for PNA.

## 2016-11-24 ENCOUNTER — Ambulatory Visit: Payer: Medicare Other | Attending: Pulmonary Disease

## 2016-11-24 DIAGNOSIS — J8410 Pulmonary fibrosis, unspecified: Secondary | ICD-10-CM | POA: Diagnosis present

## 2016-11-24 DIAGNOSIS — J84112 Idiopathic pulmonary fibrosis: Secondary | ICD-10-CM | POA: Diagnosis not present

## 2016-11-30 ENCOUNTER — Telehealth: Payer: Self-pay | Admitting: Pulmonary Disease

## 2016-11-30 NOTE — Telephone Encounter (Signed)
Returned call to patient and let her know she shouldn't need another cxr.ss

## 2016-11-30 NOTE — Telephone Encounter (Signed)
Pt calling stating she did Chest xray with pcp on Wednesday  She would like to know does she need to do another before coming in on Friday Please advise.

## 2016-12-04 ENCOUNTER — Encounter: Payer: Self-pay | Admitting: Pulmonary Disease

## 2016-12-04 ENCOUNTER — Ambulatory Visit (INDEPENDENT_AMBULATORY_CARE_PROVIDER_SITE_OTHER): Payer: Medicare Other | Admitting: Pulmonary Disease

## 2016-12-04 VITALS — BP 130/100 | HR 87 | Ht 60.0 in | Wt 126.0 lb

## 2016-12-04 DIAGNOSIS — R053 Chronic cough: Secondary | ICD-10-CM

## 2016-12-04 DIAGNOSIS — M0510 Rheumatoid lung disease with rheumatoid arthritis of unspecified site: Secondary | ICD-10-CM

## 2016-12-04 DIAGNOSIS — R05 Cough: Secondary | ICD-10-CM

## 2016-12-04 NOTE — Patient Instructions (Signed)
No changes made to your medical treatments today Continue Flonase, cetirizine, omeprazole as previously prescribed Follow-up in 6 months for routine recheck We will continue on a schedule of chest x-ray and PFTs at least once a year

## 2016-12-08 NOTE — Progress Notes (Signed)
PULMONARY OFFICE FOLLOW UP NOTE  PROBLEMS: Rheumatoid arthritis Hospitalized 3/13 - 03/25/15 with acute (?on chronic) hypoxemic respiratory failure and severe pneumonitis pattern on CXR, CT chest.  Treated with empiric antibiotics and systemic steroids with gradual improvement  Discharged home on tapering prednisone, home O2 Pulmonary fibrosis - previously on MTX 03/18/15 CT chest: Chronic pulmonary fibrosis, bibasilar predominant, likely related to the given history of rheumatoid arthritis. Diffuse patchy ground-glass opacities throughout both lungs. 05/07/15 CT chest: Persistence of fibrotic changes. Resolution of diffuse GGOs 05/08/15 PFT: Mild restriction, mod decrease DLCO. Minimal desaturation on 6 min walk (to 92%) 05/16/15 Overnight oximetry: relatively brief period of desaturation but to a low of 68% 11/13/15 CXR: mild interstitial prominence. NAD 11/13/15 PFTs: no obstruction, mild restriction, DLCO measurement invalid  08/12/16 6MWT: 300 meters, SpO2 98>91% 11/24/16 PFTs: No obstruction, lung volumes low-normal, DLCO normal 11/08/16 [place] interstitial prominence   INTERVAL HISTORY:   SUBJ: This is a routine reevaluation.  She has no new complaints.  She continues to have occasional cough paroxysms occurring approximately once per day and lasting for up to 15 seconds.  Her exertional dyspnea is unchanged to improved.  She denies CP, fever, purulent sputum, hemoptysis, LE edema and calf tenderness    OBJ: Vitals:   12/04/16 1458 12/04/16 1502  BP:  (!) 130/100  Pulse:  87  SpO2:  93%  Weight: 57.2 kg (126 lb)   Height: 5' (1.524 m)     Gen: NAD HEENT: Mild rhinitis  Neck: No LAN, no JVD noted Lungs: full BS, L>R bibasilar crackles, no wheezes Cardiovascular: RRR, no M noted Abdomen: Soft, NT +BS Ext: no C/C/E. 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

## 2016-12-09 ENCOUNTER — Other Ambulatory Visit: Payer: Self-pay | Admitting: Pulmonary Disease

## 2017-01-10 ENCOUNTER — Other Ambulatory Visit: Payer: Self-pay | Admitting: Pulmonary Disease

## 2017-01-17 ENCOUNTER — Other Ambulatory Visit: Payer: Self-pay | Admitting: Pulmonary Disease

## 2017-03-08 ENCOUNTER — Ambulatory Visit (INDEPENDENT_AMBULATORY_CARE_PROVIDER_SITE_OTHER): Payer: Medicare Other | Admitting: Internal Medicine

## 2017-03-08 ENCOUNTER — Encounter: Payer: Self-pay | Admitting: Internal Medicine

## 2017-03-08 VITALS — BP 138/96 | HR 90 | Ht 60.0 in | Wt 122.0 lb

## 2017-03-08 DIAGNOSIS — R05 Cough: Secondary | ICD-10-CM

## 2017-03-08 DIAGNOSIS — M0510 Rheumatoid lung disease with rheumatoid arthritis of unspecified site: Secondary | ICD-10-CM | POA: Diagnosis not present

## 2017-03-08 DIAGNOSIS — R053 Chronic cough: Secondary | ICD-10-CM

## 2017-03-08 DIAGNOSIS — J8410 Pulmonary fibrosis, unspecified: Secondary | ICD-10-CM | POA: Diagnosis not present

## 2017-03-08 DIAGNOSIS — J849 Interstitial pulmonary disease, unspecified: Secondary | ICD-10-CM | POA: Diagnosis not present

## 2017-03-08 MED ORDER — CETIRIZINE HCL 10 MG PO TABS: 10.0000 mg | ORAL_TABLET | Freq: Every day | ORAL | 6 refills | Status: DC

## 2017-03-08 MED ORDER — BENZONATATE 200 MG PO CAPS: 200.0000 mg | ORAL_CAPSULE | Freq: Three times a day (TID) | ORAL | 5 refills | Status: DC

## 2017-03-08 NOTE — Progress Notes (Signed)
PULMONARY OFFICE FOLLOW UP NOTE  PROBLEMS: Rheumatoid arthritis Hospitalized 3/13 - 03/25/15 with acute (?on chronic) hypoxemic respiratory failure and severe pneumonitis pattern on CXR, CT chest.  Treated with empiric antibiotics and systemic steroids with gradual improvement  Discharged home on tapering prednisone, home O2 Pulmonary fibrosis - previously on MTX 03/18/15 CT chest: Chronic pulmonary fibrosis, bibasilar predominant, likely related to the given history of rheumatoid arthritis. Diffuse patchy ground-glass opacities throughout both lungs. 05/07/15 CT chest: Persistence of fibrotic changes. Resolution of diffuse GGOs 05/08/15 PFT: Mild restriction, mod decrease DLCO. Minimal desaturation on 6 min walk (to 92%) 05/16/15 Overnight oximetry: relatively brief period of desaturation but to a low of 68% 11/13/15 CXR: mild interstitial prominence. NAD 11/13/15 PFTs: no obstruction, mild restriction, DLCO measurement invalid  08/12/16 6MWT: 300 meters, SpO2 98>91% 11/24/16 PFTs: No obstruction, lung volumes low-normal, DLCO normal 11/08/16 NSC interstitial prominence   INTERVAL HISTORY:   SUBJ: Patient is a 72 year old female, she normally sees Dr. Alva Garnet for pulmonary fibrosis.  She comes in as an acute visit today for cough. She has been having a chronic cough which has been present for about 5 months. This was discussed at last visit it was though that it was sinus drainage and she has been doing flonase one spray in each nostril once daily. She was taking tessalon which helped but this ran out. She was asked to cetrizine, but she forgot to continue this.    OBJ: Vitals:   03/08/17 1141 03/08/17 1145  BP:  (!) 138/96  Pulse:  90  SpO2:  95%  Weight: 122 lb (55.3 kg)   Height: 5' (1.524 m)     Gen: NAD HEENT: Mild rhinitis  Neck: No LAN, no JVD noted Lungs: full BS, L>R bibasilar crackles, no wheezes Cardiovascular: RRR, no M noted Abdomen: Soft, NT +BS Ext: no C/C/E. 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    PLAN: Asked to increase flonase to 2 sprays in each nostril once daily.  Will restart tessalon.  Continue, cetirizine, omeprazole as previously prescribed Follow-up in 6 months for routine recheck We will continue on a schedule of chest x-ray and PFTs at least once a year  Meds ordered this encounter  Medications  . cetirizine (ZYRTEC ALLERGY) 10 MG tablet    Sig: Take 1 tablet (10 mg total) by mouth daily.    Dispense:  30 tablet    Refill:  6  . benzonatate (TESSALON) 200 MG capsule    Sig: Take 1 capsule (200 mg total) by mouth 3 (three) times daily.    Dispense:  30 capsule    Refill:  5   Return in about 3 months (around 06/08/2017).   Marda Stalker, MD.   Board Certified in Internal Medicine, Pulmonary Medicine, [place], and Sleep Medicine.  [facility] Office Number: [phone] Pager: [phone]  Patricia Pesa, M.D.  Merton Border, M.D  03/08/2017 11:46 AM

## 2017-03-08 NOTE — Patient Instructions (Signed)
Use flonase 2 spray in each nostril once daily.

## 2017-03-11 ENCOUNTER — Other Ambulatory Visit: Payer: Self-pay | Admitting: Internal Medicine

## 2017-03-11 MED ORDER — FLUTICASONE FUROATE-VILANTEROL 100-25 MCG/INH IN AEPB: INHALATION_SPRAY | RESPIRATORY_TRACT | 11 refills | Status: DC

## 2017-05-25 ENCOUNTER — Other Ambulatory Visit: Payer: Self-pay | Admitting: Internal Medicine

## 2017-05-25 DIAGNOSIS — Z1231 Encounter for screening mammogram for malignant neoplasm of breast: Secondary | ICD-10-CM

## 2017-06-03 ENCOUNTER — Ambulatory Visit (INDEPENDENT_AMBULATORY_CARE_PROVIDER_SITE_OTHER): Payer: Medicare Other | Admitting: Pulmonary Disease

## 2017-06-03 ENCOUNTER — Encounter: Payer: Self-pay | Admitting: Pulmonary Disease

## 2017-06-03 VITALS — BP 118/80 | HR 92 | Ht 60.0 in | Wt 125.0 lb

## 2017-06-03 DIAGNOSIS — J8410 Pulmonary fibrosis, unspecified: Secondary | ICD-10-CM

## 2017-06-03 DIAGNOSIS — M0510 Rheumatoid lung disease with rheumatoid arthritis of unspecified site: Secondary | ICD-10-CM

## 2017-06-03 DIAGNOSIS — R053 Chronic cough: Secondary | ICD-10-CM

## 2017-06-03 DIAGNOSIS — R05 Cough: Secondary | ICD-10-CM

## 2017-06-03 MED ORDER — TRAZODONE HCL 50 MG PO TABS: 25.0000 mg | ORAL_TABLET | Freq: Every day | ORAL | 2 refills | Status: DC

## 2017-06-03 NOTE — Patient Instructions (Signed)
Trial off of Breo inhaler for at least 1 week Trial of Spiriva inhaler Pay attention to symptoms of cough and shortness of breath and decide which (if any) inhaler provides the most symptomatic relief Follow-up in 6 months with repeat lung function tests and chest x-ray

## 2017-06-08 NOTE — Progress Notes (Signed)
PULMONARY OFFICE FOLLOW UP NOTE  PROBLEMS: Rheumatoid arthritis Pulmonary fibrosis - previously on MTX  Hospitalized 3/13 - 03/25/15 with acute (?on chronic) hypoxemic respiratory failure and severe pneumonitis pattern on CXR, CT chest. Treated with empiric antibiotics and systemic steroids with gradual improvement. Discharged home on tapering prednisone, home O2  DATA: Hospitalized 3/13 - 03/25/15 with acute (?on chronic) hypoxemic respiratory failure and severe pneumonitis pattern on CXR, CT chest. Treated with empiric antibiotics and systemic steroids with gradual improvement. Discharged home on tapering prednisone, home O2 03/18/15 CT chest: Chronic pulmonary fibrosis, bibasilar predominant, likely related to the given history of rheumatoid arthritis. Diffuse patchy ground-glass opacities throughout both lungs. 05/07/15 CT chest: Persistence of fibrotic changes. Resolution of diffuse GGOs 05/08/15 PFT: Mild restriction, mod decrease DLCO. Minimal desaturation on 6 min walk (to 92%) 05/16/15 Overnight oximetry: relatively brief period of desaturation but to a low of 68% 11/13/15 PFTs: no obstruction, mild restriction, DLCO measurement invalid  08/12/16 6MWT: 300 meters, SpO2 98>91% 11/24/16 PFTs: No obstruction, lung volumes low-normal, DLCO normal 11/08/16 [place] interstitial prominence   INTERVAL HISTORY: No major events  SUBJ: This is a scheduled R OV.  She reports chronic, nonproductive cough.  Otherwise, she has no new complaints.  Overall she is doing very well.  Her rheumatoid arthritis is improved.  She is walking regularly for exercise and notes mild exertional dyspnea with minimal day-to-day variation.  She remains on Breo inhaler but is unable to discern how much benefit it is offering her.  She monitors her home oxygen saturations and never dropped below 90%.  She denies CP, fever, purulent sputum, hemoptysis, LE edema and calf tenderness    OBJ: Vitals:   06/03/17 1135 06/03/17  1140  BP:  118/80  Pulse:  92  SpO2:  94%  Weight:  125 lb (56.7 kg)  Height: 5' (1.524 m)   RA  NAD HEENT WNL No JVD Diffuse, fine bilateral crackles, no wheezes RRR, no M NABS, soft Chronic arthritic changes of RA in both hands No focal neurologic deficits    DATA: BMP Latest Ref Rng & Units 11/10/2016 11/09/2016 11/08/2016  Glucose 65 - 99 mg/dL 123(H) 95 123(H)  BUN 6 - 20 mg/dL 13 8 13   Creatinine 0.44 - 1.00 mg/dL 0.75 0.67 0.71  Sodium 135 - 145 mmol/L 137 138 134(L)  Potassium 3.5 - 5.1 mmol/L 3.6 3.9 3.3(L)  Chloride 101 - 111 mmol/L 104 107 97(L)  CO2 22 - 32 mmol/L 27 26 27   Calcium 8.9 - 10.3 mg/dL 8.4(L) 8.3(L) 8.8(L)   CBC Latest Ref Rng & Units 11/11/2016 11/09/2016 11/08/2016  WBC 3.6 - 11.0 K/uL 6.5 12.5(H) 17.3(H)  Hemoglobin 12.0 - 16.0 g/dL 10.7(L) 11.3(L) 12.4  Hematocrit 35.0 - 47.0 % 32.1(L) 34.4(L) 37.1  Platelets 150 - 440 K/uL 169 156 177    CXR: No new film  IMPRESSION: 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

## 2017-06-15 ENCOUNTER — Other Ambulatory Visit: Payer: Self-pay | Admitting: Internal Medicine

## 2017-08-10 ENCOUNTER — Telehealth: Payer: Self-pay | Admitting: Pulmonary Disease

## 2017-08-10 NOTE — Telephone Encounter (Signed)
LMOVM for pt to return call 

## 2017-08-10 NOTE — Telephone Encounter (Signed)
Patient notified that she needs to be evaluated. She will call Dr. Doy Hutching pcp. DS will be in clinic Friday but does not have any openings.

## 2017-08-10 NOTE — Telephone Encounter (Signed)
Needs to be evaluated in clinic

## 2017-08-10 NOTE — Telephone Encounter (Signed)
Pt calling stating she's had this cough for some time now. But it is not getting better. It started off being a tickle in her throat but is now to the point where it is constant. She states she does have allergies but not sure if this is that.   Would like advise on this to help it go away  Please cal back

## 2017-08-10 NOTE — Telephone Encounter (Signed)
Please advise on message below.

## 2017-08-10 NOTE — Telephone Encounter (Signed)
Patient returning call see below.

## 2017-08-19 ENCOUNTER — Telehealth: Payer: Self-pay | Admitting: Pulmonary Disease

## 2017-08-19 NOTE — Telephone Encounter (Signed)
Patient calling and states she will be having left ankle surgery on Sept. 4th Due to patient's lung issues she would like to know what anesthesia is best, patient's surgeon will be Dr. Vickki Muff from [facility] Please call to discuss

## 2017-08-19 NOTE — Telephone Encounter (Signed)
That is considered a low risk procedure. It would be ideal if they can do it with local rather than general anesthesia but OK to do general anesthesia if necessary. The anesthesiologists will take into account her lung problems in decding what agents to use for anesthesia. They are very good at that. Her risk of major pulmonary complications is only mildly elevated compared to the general population. If she has any problems around the time of surgery, we will be available to participate in her care post operatively  Waunita Schooner

## 2017-08-20 ENCOUNTER — Encounter: Payer: Self-pay | Admitting: *Deleted

## 2017-08-20 NOTE — Telephone Encounter (Signed)
LMTCB

## 2017-08-20 NOTE — Telephone Encounter (Signed)
Patient aware She would like Dr. Vickki Muff to be made aware at [facility]. Letter will be faxed per patient request.

## 2017-08-20 NOTE — Telephone Encounter (Signed)
Patient returning call.

## 2017-08-31 ENCOUNTER — Other Ambulatory Visit: Payer: Self-pay | Admitting: Podiatry

## 2017-08-31 ENCOUNTER — Inpatient Hospital Stay: Admission: RE | Admit: 2017-08-31 | Payer: Medicare Other | Source: Ambulatory Visit

## 2017-09-01 ENCOUNTER — Other Ambulatory Visit: Payer: Self-pay

## 2017-09-01 ENCOUNTER — Encounter
Admission: RE | Admit: 2017-09-01 | Discharge: 2017-09-01 | Disposition: A | Discharge status: Home / Self Care | Payer: Medicare Other | Source: Ambulatory Visit | Attending: Podiatry | Admitting: Podiatry

## 2017-09-01 DIAGNOSIS — R001 Bradycardia, unspecified: Secondary | ICD-10-CM | POA: Diagnosis not present

## 2017-09-01 DIAGNOSIS — Z01810 Encounter for preprocedural cardiovascular examination: Secondary | ICD-10-CM | POA: Diagnosis present

## 2017-09-01 DIAGNOSIS — Z01812 Encounter for preprocedural laboratory examination: Secondary | ICD-10-CM | POA: Insufficient documentation

## 2017-09-01 HISTORY — DX: Essential (primary) hypertension: I10

## 2017-09-01 HISTORY — DX: Pneumonia, unspecified organism: J18.9

## 2017-09-01 HISTORY — DX: Unilateral primary osteoarthritis, unspecified knee: M17.10

## 2017-09-01 HISTORY — DX: Torus fracture of lower end of right fibula, initial encounter for closed fracture: S82.821A

## 2017-09-01 HISTORY — DX: Osteoarthritis of knee, unspecified: M17.9

## 2017-09-01 HISTORY — DX: Interstitial pulmonary disease, unspecified: J84.9

## 2017-09-01 HISTORY — DX: Enterocolitis due to Clostridium difficile, not specified as recurrent: A04.72

## 2017-09-01 HISTORY — DX: Vitamin D deficiency, unspecified: E55.9

## 2017-09-01 HISTORY — DX: Unspecified fracture of sternum, initial encounter for closed fracture: S22.20XA

## 2017-09-01 LAB — BASIC METABOLIC PANEL
Anion gap: 8 (ref 5–15)
BUN: 12 mg/dL (ref 8–23)
CALCIUM: 9.2 mg/dL (ref 8.9–10.3)
CO2: 30 mmol/L (ref 22–32)
CREATININE: 0.94 mg/dL (ref 0.44–1.00)
Chloride: 101 mmol/L (ref 98–111)
GFR calc Af Amer: >60 mL/min (ref >60)
GFR calc non Af Amer: 59 mL/min — ABNORMAL LOW (ref >60)
GLUCOSE: 89 mg/dL (ref 70–99)
Potassium: 4.2 mmol/L (ref 3.5–5.1)
Sodium: 139 mmol/L (ref 135–145)

## 2017-09-01 NOTE — Patient Instructions (Signed)
Your procedure is scheduled on: Friday, September 10, 2017 Report to Day Surgery on the 2nd floor of the [facility]. To find out your arrival time, please call [phone] between 1PM - 3PM on: Thursday, September 09, 2017  REMEMBER: Instructions that are not followed completely may result in serious medical risk, up to and including death; or upon the discretion of your surgeon and anesthesiologist your surgery may need to be rescheduled.  Do not eat food after midnight the night before surgery.  No gum chewing, lozengers or hard candies.  You may however, drink CLEAR liquids up to 2 hours before you are scheduled to arrive for your surgery. Do not drink anything within 2 hours of the start of your surgery.  Clear liquids include: - water  - apple juice without pulp - gatorade - black coffee or tea (Do NOT add milk or creamers to the coffee or tea) Do NOT drink anything that is not on this list.  No Alcohol for 24 hours before or after surgery.  No Smoking including e-cigarettes for 24 hours prior to surgery.  No chewable tobacco products for at least 6 hours prior to surgery.  No nicotine patches on the day of surgery.  On the morning of surgery brush your teeth with toothpaste and water, you may rinse your mouth with mouthwash if you wish. Do not swallow any toothpaste or mouthwash.  Notify your doctor if there is any change in your medical condition (cold, fever, infection).  Do not wear jewelry, make-up, hairpins, clips or nail polish.  Do not wear lotions, powders, or perfumes. You may wear deodorant.  Do not shave 48 hours prior to surgery.   Contacts and dentures may not be worn into surgery.  Do not bring valuables to the hospital, including drivers license, insurance or credit cards.  [facility] is not responsible for any belongings or valuables.   TAKE THESE MEDICATIONS THE MORNING OF SURGERY:  1.  Bisoprolol 2.  Buspirone 3.  Omeprazole (take one the night  before surgery and one the morning of surgery - helps to prevent nausea after surgery) 4.  spiriva 5.  Bupropion 6.  pristiq  Use CHG Soap as directed on instruction sheet.  Use inhalers on the day of surgery and bring to the hospital.  NOW!  Stop Anti-inflammatories (NSAIDS) such as Advil, Aleve, Ibuprofen, Motrin, Naproxen, Naprosyn and Aspirin based products such as Excedrin, Goodys Powder, BC Powder. (May take Tylenol or Acetaminophen if needed.)  NOW!  Stop ANY OVER THE COUNTER supplements until after surgery. (VITAMIN C, POTASSIUM)  Wear comfortable clothing (specific to your surgery type) to the hospital.  Plan for stool softeners for home use.  If you are being discharged the day of surgery, you will not be allowed to drive home. You will need a responsible adult to drive you home and stay with you that night.   If you are taking public transportation, you will need to have a responsible adult with you. Please confirm with your physician that it is acceptable to use public transportation.   Please call [phone] if you have any questions about these instructions.

## 2017-09-01 NOTE — Pre-Procedure Instructions (Signed)
Incentive spirometer instructions written and verbal given; acknowledged understanding. Patient states that she has one at home that she occasionally uses and didn't need a new one today.

## 2017-09-02 NOTE — Pre-Procedure Instructions (Signed)
EKG C/W 2018

## 2017-09-09 MED ORDER — CLINDAMYCIN PHOSPHATE 900 MG/50ML IV SOLN
900.0000 mg | INTRAVENOUS | Status: AC
  Administered 2017-09-10: 900 mg via INTRAVENOUS

## 2017-09-10 ENCOUNTER — Ambulatory Visit: Payer: Medicare Other

## 2017-09-10 ENCOUNTER — Encounter
Admission: RE | Disposition: A | Discharge status: Home / Self Care | Payer: Self-pay | Source: Ambulatory Visit | Attending: Podiatry

## 2017-09-10 ENCOUNTER — Ambulatory Visit
Admission: RE | Admit: 2017-09-10 | Discharge: 2017-09-10 | Disposition: A | Discharge status: Home / Self Care | Payer: Medicare Other | Source: Ambulatory Visit | Attending: Podiatry | Admitting: Podiatry

## 2017-09-10 ENCOUNTER — Inpatient Hospital Stay: Payer: Self-pay | Admitting: Anesthesiology

## 2017-09-10 ENCOUNTER — Encounter: Payer: Self-pay | Admitting: Anesthesiology

## 2017-09-10 ENCOUNTER — Other Ambulatory Visit: Payer: Self-pay

## 2017-09-10 DIAGNOSIS — F329 Major depressive disorder, single episode, unspecified: Secondary | ICD-10-CM | POA: Diagnosis not present

## 2017-09-10 DIAGNOSIS — S8262XA Displaced fracture of lateral malleolus of left fibula, initial encounter for closed fracture: Secondary | ICD-10-CM | POA: Insufficient documentation

## 2017-09-10 DIAGNOSIS — Z79899 Other long term (current) drug therapy: Secondary | ICD-10-CM | POA: Insufficient documentation

## 2017-09-10 DIAGNOSIS — Z419 Encounter for procedure for purposes other than remedying health state, unspecified: Secondary | ICD-10-CM

## 2017-09-10 DIAGNOSIS — X58XXXA Exposure to other specified factors, initial encounter: Secondary | ICD-10-CM | POA: Insufficient documentation

## 2017-09-10 DIAGNOSIS — S93422A Sprain of deltoid ligament of left ankle, initial encounter: Secondary | ICD-10-CM | POA: Insufficient documentation

## 2017-09-10 DIAGNOSIS — I10 Essential (primary) hypertension: Secondary | ICD-10-CM | POA: Insufficient documentation

## 2017-09-10 HISTORY — PX: ORIF ANKLE FRACTURE: SHX5408

## 2017-09-10 HISTORY — PX: ANKLE RECONSTRUCTION: SHX1151

## 2017-09-10 SURGERY — RECONSTRUCTION, ANKLE
Anesthesia: Monitor Anesthesia Care | Laterality: Left

## 2017-09-10 MED ORDER — PROPOFOL 10 MG/ML IV BOLUS
INTRAVENOUS | Status: DC | PRN
  Administered 2017-09-10: 40 mg via INTRAVENOUS

## 2017-09-10 MED ORDER — PROPOFOL 10 MG/ML IV BOLUS
INTRAVENOUS | Status: AC
  Filled 2017-09-10: qty 20

## 2017-09-10 MED ORDER — EPINEPHRINE PF 1 MG/ML IJ SOLN
INTRAMUSCULAR | Status: AC
  Filled 2017-09-10: qty 1

## 2017-09-10 MED ORDER — FENTANYL CITRATE (PF) 250 MCG/5ML IJ SOLN
INTRAMUSCULAR | Status: AC
  Filled 2017-09-10: qty 5

## 2017-09-10 MED ORDER — ROPIVACAINE HCL 5 MG/ML IJ SOLN
INTRAMUSCULAR | Status: AC
  Filled 2017-09-10: qty 30

## 2017-09-10 MED ORDER — PROPOFOL 500 MG/50ML IV EMUL
INTRAVENOUS | Status: DC | PRN
  Administered 2017-09-10: 80 ug/kg/min via INTRAVENOUS

## 2017-09-10 MED ORDER — FENTANYL CITRATE (PF) 100 MCG/2ML IJ SOLN
25.0000 ug | Freq: Once | INTRAMUSCULAR | Status: AC
  Administered 2017-09-10: 25 ug via INTRAVENOUS

## 2017-09-10 MED ORDER — LIDOCAINE-EPINEPHRINE 1 %-1:100000 IJ SOLN
INTRAMUSCULAR | Status: AC
  Filled 2017-09-10: qty 1

## 2017-09-10 MED ORDER — ONDANSETRON HCL 4 MG/2ML IJ SOLN
INTRAMUSCULAR | Status: DC | PRN
  Administered 2017-09-10: 4 mg via INTRAVENOUS

## 2017-09-10 MED ORDER — FENTANYL CITRATE (PF) 100 MCG/2ML IJ SOLN
50.0000 ug | Freq: Once | INTRAMUSCULAR | Status: AC
  Administered 2017-09-10: 50 ug via INTRAVENOUS

## 2017-09-10 MED ORDER — PROPOFOL 500 MG/50ML IV EMUL
INTRAVENOUS | Status: AC
  Filled 2017-09-10: qty 50

## 2017-09-10 MED ORDER — SEVOFLURANE IN SOLN
RESPIRATORY_TRACT | Status: AC
  Filled 2017-09-10: qty 250

## 2017-09-10 MED ORDER — ACETAMINOPHEN 10 MG/ML IV SOLN
INTRAVENOUS | Status: DC | PRN
  Administered 2017-09-10: 1000 mg via INTRAVENOUS

## 2017-09-10 MED ORDER — ACETAMINOPHEN 10 MG/ML IV SOLN
INTRAVENOUS | Status: AC
  Filled 2017-09-10: qty 100

## 2017-09-10 MED ORDER — CLINDAMYCIN PHOSPHATE 900 MG/50ML IV SOLN
INTRAVENOUS | Status: AC
  Filled 2017-09-10: qty 50

## 2017-09-10 MED ORDER — LACTATED RINGERS IV SOLN
INTRAVENOUS | Status: DC
  Administered 2017-09-10: 1000 mL via INTRAVENOUS
  Administered 2017-09-10: 08:00:00 via INTRAVENOUS

## 2017-09-10 MED ORDER — LIDOCAINE HCL (PF) 1 % IJ SOLN
INTRAMUSCULAR | Status: AC
  Filled 2017-09-10: qty 5

## 2017-09-10 MED ORDER — ROPIVACAINE HCL 5 MG/ML IJ SOLN
INTRAMUSCULAR | Status: DC | PRN
  Administered 2017-09-10: 30 mL via PERINEURAL

## 2017-09-10 MED ORDER — FENTANYL CITRATE (PF) 100 MCG/2ML IJ SOLN: 25.0000 ug | INTRAMUSCULAR | Status: DC | PRN

## 2017-09-10 MED ORDER — BUPIVACAINE-EPINEPHRINE (PF) 0.25% -1:200000 IJ SOLN
INTRAMUSCULAR | Status: DC | PRN
  Administered 2017-09-10: 20 mL via PERINEURAL

## 2017-09-10 MED ORDER — LIDOCAINE HCL (PF) 1 % IJ SOLN
INTRAMUSCULAR | Status: DC | PRN
  Administered 2017-09-10: .8 mL via SUBCUTANEOUS

## 2017-09-10 MED ORDER — BUPIVACAINE-EPINEPHRINE (PF) 0.25% -1:200000 IJ SOLN
INTRAMUSCULAR | Status: AC
  Filled 2017-09-10: qty 30

## 2017-09-10 MED ORDER — OXYCODONE-ACETAMINOPHEN 5-325 MG PO TABS: 1.0000 | ORAL_TABLET | ORAL | 0 refills | Status: AC | PRN

## 2017-09-10 MED ORDER — LIDOCAINE-EPINEPHRINE 1 %-1:100000 IJ SOLN
INTRAMUSCULAR | Status: DC | PRN
  Administered 2017-09-10: 30 mL

## 2017-09-10 MED ORDER — PROMETHAZINE HCL 12.5 MG PO TABS: 12.5000 mg | ORAL_TABLET | Freq: Four times a day (QID) | ORAL | 0 refills | Status: DC | PRN

## 2017-09-10 MED ORDER — FENTANYL CITRATE (PF) 100 MCG/2ML IJ SOLN
INTRAMUSCULAR | Status: AC
  Administered 2017-09-10: 50 ug via INTRAVENOUS
  Filled 2017-09-10: qty 2

## 2017-09-10 MED ORDER — POVIDONE-IODINE 7.5 % EX SOLN: Freq: Once | CUTANEOUS | Status: DC

## 2017-09-10 MED ORDER — LIDOCAINE HCL (PF) 1 % IJ SOLN
INTRAMUSCULAR | Status: AC
  Filled 2017-09-10: qty 30

## 2017-09-10 MED ORDER — MIDAZOLAM HCL 2 MG/2ML IJ SOLN
INTRAMUSCULAR | Status: AC
  Administered 2017-09-10: 1 mg via INTRAVENOUS
  Filled 2017-09-10: qty 2

## 2017-09-10 MED ORDER — ONDANSETRON HCL 4 MG/2ML IJ SOLN: 4.0000 mg | Freq: Once | INTRAMUSCULAR | Status: DC | PRN

## 2017-09-10 MED ORDER — MIDAZOLAM HCL 2 MG/2ML IJ SOLN
1.0000 mg | Freq: Once | INTRAMUSCULAR | Status: AC
  Administered 2017-09-10: 1 mg via INTRAVENOUS

## 2017-09-10 MED ORDER — BUPIVACAINE HCL (PF) 0.5 % IJ SOLN
INTRAMUSCULAR | Status: AC
  Filled 2017-09-10: qty 30

## 2017-09-10 SURGICAL SUPPLY — 55 items
BANDAGE ELASTIC 4 LF NS (GAUZE/BANDAGES/DRESSINGS) ×4 IMPLANT
BIT DRILL 2.5X2.75 QC CALB (BIT) ×2 IMPLANT
BIT DRILL CALIBRATED 2.7 (BIT) ×2 IMPLANT
BLADE SURG 15 STRL LF DISP TIS (BLADE) IMPLANT
BLADE SURG 15 STRL SS (BLADE)
BNDG COHESIVE 4X5 TAN STRL (GAUZE/BANDAGES/DRESSINGS) ×2 IMPLANT
BNDG CONFORM 3 STRL LF (GAUZE/BANDAGES/DRESSINGS) ×2 IMPLANT
BNDG ESMARK 4X12 TAN STRL LF (GAUZE/BANDAGES/DRESSINGS) ×2 IMPLANT
BNDG GAUZE 4.5X4.1 6PLY STRL (MISCELLANEOUS) ×2 IMPLANT
CANISTER SUCT 1200ML W/VALVE (MISCELLANEOUS) ×2 IMPLANT
CUFF DUAL TOURNIQUET 24IN DISP (TOURNIQUET CUFF) ×4 IMPLANT
DRAPE C-ARM XRAY 36X54 (DRAPES) ×2 IMPLANT
DRAPE C-ARMOR (DRAPES) IMPLANT
DURAPREP 26ML APPLICATOR (WOUND CARE) ×2 IMPLANT
ELECT REM PT RETURN 9FT ADLT (ELECTROSURGICAL) ×2
ELECTRODE REM PT RTRN 9FT ADLT (ELECTROSURGICAL) ×1 IMPLANT
GAUZE PETRO XEROFOAM 1X8 (MISCELLANEOUS) ×2 IMPLANT
GAUZE SPONGE 4X4 12PLY STRL (GAUZE/BANDAGES/DRESSINGS) ×2 IMPLANT
GAUZE STRETCH 2X75IN STRL (MISCELLANEOUS) IMPLANT
GLOVE BIO SURGEON STRL SZ7.5 (GLOVE) ×2 IMPLANT
GLOVE INDICATOR 8.0 STRL GRN (GLOVE) ×2 IMPLANT
GOWN STRL REUS W/ TWL LRG LVL3 (GOWN DISPOSABLE) ×3 IMPLANT
GOWN STRL REUS W/TWL LRG LVL3 (GOWN DISPOSABLE) ×3
GRAFT TRIN ELITE MED MUSC TRAN (Graft) ×2 IMPLANT
K-WIRE ACE 1.6X6 (WIRE) ×4
KIT TURNOVER KIT A (KITS) ×2 IMPLANT
KWIRE ACE 1.6X6 (WIRE) ×2 IMPLANT
LABEL OR SOLS (LABEL) ×2 IMPLANT
NS IRRIG 500ML POUR BTL (IV SOLUTION) ×2 IMPLANT
PACK EXTREMITY ARMC (MISCELLANEOUS) ×2 IMPLANT
PAD PREP 24X41 OB/GYN DISP (PERSONAL CARE ITEMS) ×2 IMPLANT
PLATE LOCK 3H 95 LT DIST FIB (Plate) ×2 IMPLANT
SCREW LOCK CORT STAR 3.5X10 (Screw) ×6 IMPLANT
SCREW LOCK CORT STAR 3.5X12 (Screw) ×4 IMPLANT
SCREW LOCK CORT STAR 3.5X14 (Screw) ×6 IMPLANT
SCREW NON LOCKING LP 3.5 16MM (Screw) ×2 IMPLANT
SPLINT CAST 1 STEP 4X30 (MISCELLANEOUS) ×2 IMPLANT
SPLINT FAST PLASTER 5X30 (CAST SUPPLIES)
SPLINT PLASTER CAST FAST 5X30 (CAST SUPPLIES) IMPLANT
SPONGE LAP 18X18 RF (DISPOSABLE) ×2 IMPLANT
STAPLER SKIN PROX 35W (STAPLE) ×2 IMPLANT
STIMULATOR BONE (ORTHOPEDIC SUPPLIES) ×1
STIMULATOR BONE GROWTH EMG EXT (ORTHOPEDIC SUPPLIES) ×1 IMPLANT
STOCKINETTE M/LG 89821 (MISCELLANEOUS) ×2 IMPLANT
STRAP SAFETY 5IN WIDE (MISCELLANEOUS) ×2 IMPLANT
STRIP CLOSURE SKIN 1/2X4 (GAUZE/BANDAGES/DRESSINGS) IMPLANT
SUT ETHILON 3 0 PS 1 (SUTURE) ×4 IMPLANT
SUT PDS PLUS 0 (SUTURE) ×1
SUT PDS PLUS AB 0 CT-2 (SUTURE) ×1 IMPLANT
SUT VIC AB 2-0 CT1 27 (SUTURE) ×1
SUT VIC AB 2-0 CT1 TAPERPNT 27 (SUTURE) ×1 IMPLANT
SUT VIC AB 3-0 SH 27 (SUTURE) ×1
SUT VIC AB 3-0 SH 27X BRD (SUTURE) ×1 IMPLANT
SWABSTK COMLB BENZOIN TINCTURE (MISCELLANEOUS) IMPLANT
SYR 10ML LL (SYRINGE) ×2 IMPLANT

## 2017-09-10 NOTE — Progress Notes (Signed)
apillary refill positive to left foot   Can wiggle toes on left

## 2017-09-10 NOTE — Op Note (Signed)
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  Rupture medial deltoid ligament    Postop diagnosis: Same    Procedure: 1.  Repair distal fibular nonunion with Trinity bone graft and ORIF of distal fibula 2.  Repair medial deltoid ligament 3.  Placement external bone stimulator    EBL: Less than 100 cc    Anesthesia:regional and IV sedation.  Patient underwent popliteal block preoperatively.  Also underwent local anesthetic with 0.25% bupivacaine with epinephrine 20 cc total at the end of the procedure.    Hemostasis: Epinephrine 1-200,000 with lidocaine infiltrated along the incision sites    Specimen: Fibular nonunion    Complications: None    Operative indications:Stephanie Patel is an 72 y.o. that presents today for surgical intervention.  The risks/benefits/alternatives/complications have been discussed and consent has been given.    Procedure:  Patient was brought into the OR and placed on the operating table in thesupine position. After anesthesia was obtained theleft lower extremity was prepped and draped in usual sterile fashion.  Attention was directed to the lateral fibula where longitudinal incision was made and performed.  Sharp and blunt dissection carried down to the periosteum.  Upon exposure there was noted to be scarring with periosteal bone reaction.  Noted instability of the distal fibular fragment was found.  At this time the fibular nonunion was taken down with a combination of Freer elevators, curettes, and rondure were.  All fibular nonunion material was removed down to good healthy bleeding bone.  At this time attempted reduction of the distal fibula was performed with a residual lateral positioning of the tibiotalar joint.  Attention was then directed to the medial aspect of the ankle where a incision was made along the medial malleolus.  Sharp and blunt dissection carried down to the  deep deltoid area.  This was incised and the medial gutter of the ankle joint was entered.  Notable fibrotic tissue was found and this was excised.  The detailed toyed ligament was then freed.  The ankle joint was much more anatomically aligned after removal of the deep scar tissue.  Attention was redirected laterally.  The fracture site and nonunion site was then prepped and packed with 5 cc of Trinity bone graft.  An anatomic locking fibular plate from the [employer] lower extremity set was placed laterally.  Distal locking screws were placed initially.  Nonlocking screw was placed proximal to compress the plate against the bone.  Further screw holes were then filled with locking screws.  All screws were 3.5 mm.  Good anatomic alignment of the ankle joint was noted.  The medial deltoid ligament was then repaired with an 0 PDS suture.  Final closure was performed with a combination of 2-0 and 3-0 Vicryl and 3-0 nylon for skin.  All areas were infiltrated with 0.25% bupivacaine with epinephrine.  A posterior splint was placed on the left lower leg.  Finally the external bone stimulator was placed overlying the lateral fibular region for assistant with fusion of this nonunion.    Patient tolerated the procedure and anesthesia well.  Was transported from the OR to the PACU with all vital signs stable and vascular status intact. To be discharged per routine protocol.  Will follow up in approximately 1 week in the outpatient clinic.

## 2017-09-10 NOTE — Anesthesia Postprocedure Evaluation (Signed)
Anesthesia Post Note  Patient: Stephanie Patel  Procedure(s) Performed: RECONSTRUCTION ANKLE-REPAIR, PRIMARY, DISRUPTED LIGAMENT (Left ) OPEN REDUCTION INTERNAL FIXATION (ORIF) REPAIR OF FIBULA NONUNION (Left )  Patient location during evaluation: PACU Anesthesia Type: Regional Level of consciousness: awake and alert Pain management: pain level controlled Vital Signs Assessment: post-procedure vital signs reviewed and stable Respiratory status: spontaneous breathing and respiratory function stable Cardiovascular status: stable Anesthetic complications: no     Last Vitals:  Vitals:   09/10/17 1032 09/10/17 1034  BP: 137/69   Pulse: 62 60  Resp: 18 19  Temp: 36.7 C 36.7 C  SpO2: 93% 93%    Last Pain:  Vitals:   09/10/17 1034  TempSrc:   PainSc: 0-No pain                 Robecca Fulgham K

## 2017-09-10 NOTE — Anesthesia Preprocedure Evaluation (Signed)
Anesthesia Evaluation  Patient identified by MRN, date of birth, ID band Patient awake    Reviewed: Allergy & Precautions, NPO status , Patient's Chart, lab work & pertinent test results  History of Anesthesia Complications Negative for: history of anesthetic complications  Airway Mallampati: II       Dental   Pulmonary neg sleep apnea, neg COPD, former smoker,  Pulmonary fibrosis          Cardiovascular (-) hypertension(-) Past MI and (-) CHF (-) dysrhythmias + Valvular Problems/Murmurs (murmur, no tx)      Neuro/Psych neg Seizures Depression    GI/Hepatic Neg liver ROS, GERD  Medicated,  Endo/Other  neg diabetes  Renal/GU negative Renal ROS     Musculoskeletal   Abdominal   Peds  Hematology   Anesthesia Other Findings   Reproductive/Obstetrics                             Anesthesia Physical Anesthesia Plan  ASA: III  Anesthesia Plan: General   Post-op Pain Management:    Induction: Intravenous  PONV Risk Score and Plan: 3 and Dexamethasone, Ondansetron and Midazolam  Airway Management Planned: Nasal Cannula  Additional Equipment:   Intra-op Plan:   Post-operative Plan:   Informed Consent: I have reviewed the patients History and Physical, chart, labs and discussed the procedure including the risks, benefits and alternatives for the proposed anesthesia with the patient or authorized representative who has indicated his/her understanding and acceptance.     Plan Discussed with:   Anesthesia Plan Comments:         Anesthesia Quick Evaluation

## 2017-09-10 NOTE — Progress Notes (Signed)
Left leg elevated on pillows

## 2017-09-10 NOTE — Discharge Instructions (Signed)
[address] [place]   1. Take your medication as prescribed.  Pain medication should be taken only as needed.  2. Keep the dressing clean, dry and intact.  3. Keep your foot elevated above the heart level for the first 48 hours.  4. Walking to the bathroom and brief periods of walking are acceptable, unless we have instructed you to be non-weight bearing.  5. Always wear your post-op shoe when walking.  Always use your crutches if you are to be non-weight bearing.  6. Do not get the bandage wet.  7. Every hour you are awake:  - Bend your knee 15 times.  8. Call [facility] [phone]) if any of the following problems occur: - You develop a temperature or fever. - The bandage becomes saturated with blood. - Medication does not stop your pain. - Injury of the foot occurs. - Any symptoms of infection including redness, odor, or red streaks running from wound. AMBULATORY SURGERY  DISCHARGE INSTRUCTIONS   1) The drugs that you were given will stay in your system until tomorrow so for the next 24 hours you should not:  A) Drive an automobile B) Make any legal decisions C) Drink any alcoholic beverage   2) You may resume regular meals tomorrow.  Today it is better to start with liquids and gradually work up to solid foods.  You may eat anything you prefer, but it is better to start with liquids, then soup and crackers, and gradually work up to solid foods.   3) Please notify your doctor immediately if you have any unusual bleeding, trouble breathing, redness and pain at the surgery site, drainage, fever, or pain not relieved by medication.    4) Additional Instructions:        Please contact your physician with any problems or Same Day Surgery at [phone], Monday through Friday 6 am to 4 pm, or [place] at [facility]  number at [phone].  Peripheral Nerve Block (Lower Extremity) Discharge Instructions   1.  For your surgery you have received a femoral Nerve Block.  2. Your Nerve Block is expected to last for about 4 to 12 hours.  This is an estimated time frame; the results of your nerve block may wear off sooner or may last longer.  3. If needed, your surgeon will give you a prescription for pain medication.  It will take about 60 minutes for the oral pain medication to become fully effective.  So, it is recommended that you start taking this medication before the nerve block first begins to wear off, or when you first begin to feel discomfort.  4. Keep in mind that nerve blocks often wear off in the middle of the night.   If you are going to bed and the block has not started to wear off or you have not started to have any discomfort, consider setting an alarm for 2 to 3 hours, so you can assess your block.  If you notice the block is wearing off or you are starting to have discomfort, you can take your pain medication.  5. Take your pain medication only as prescribed.  Pain medication can cause sedation and decrease your breathing if you take more than you need for the level of pain that you have.  6. Nausea is a common side effect of many pain medications.  You may want to eat  something before taking your pain medicine to prevent nausea.  7. After a Peripheral Nerve block, you cannot feel pain, pressure or extremes in temperature in the effected leg.  Because your leg is numb it is at an increased risk for injury.  To decrease the possibility of injury, please practice the following:  a. While you are awake change the position of your leg frequently to prevent too much pressure on any one area for prolonged periods of time.  b.  If you have a cast or tight dressing, check the color or your toes every couple of hours.  Call your surgeon with the appearance of any discoloration (white or blue).  c. You  may have difficulty bearing weight on the effected leg.  Have someone assist you with walking until the nerve block has completely worn off.   d. If you surgeon prescribed a brace to be worn after surgery, DO NOT GET UP AT NIGHT WITHOUT YOUR BRACE.  e. If your surgeon has restricted the amount of weight you should bear on the effected leg, i.e. No Weight, Partial Weight, or Touch Down Only, DO NOT BEAR MORE WEIGHT THAN INSTRUCTED.   If you experience any problems or concerns, please contact your surgeon.

## 2017-09-10 NOTE — Transfer of Care (Signed)
Immediate Anesthesia Transfer of Care Note  Patient: Stephanie Patel  Procedure(s) Performed: RECONSTRUCTION ANKLE-REPAIR, PRIMARY, DISRUPTED LIGAMENT (Left ) OPEN REDUCTION INTERNAL FIXATION (ORIF) REPAIR OF FIBULA NONUNION (Left )  Patient Location: PACU  Anesthesia Type:Regional and MAC combined with regional for post-op pain  Level of Consciousness: awake  Airway & Oxygen Therapy: Patient Spontanous Breathing  Post-op Assessment: Report given to RN  Post vital signs: stable  Last Vitals:  Vitals Value Taken Time  BP    Temp    Pulse 60 09/10/2017 10:30 AM  Resp 15 09/10/2017 10:30 AM  SpO2 93 % 09/10/2017 10:30 AM  Vitals shown include unvalidated device data.  Last Pain:  Vitals:   09/10/17 0744  TempSrc:   PainSc: 0-No pain         Complications: No apparent anesthesia complications

## 2017-09-10 NOTE — Anesthesia Procedure Notes (Signed)
Anesthesia Regional Block: Popliteal block   Pre-Anesthetic Checklist: ,, timeout performed, Correct Patient, Correct Site, Correct Laterality, Correct Procedure, Correct Position, site marked, Risks and benefits discussed,  Surgical consent,  Pre-op evaluation,  At surgeon's request and post-op pain management  Laterality: Left  Prep: chloraprep       Needles:  Injection technique: Single-shot  Needle Type: Echogenic Stimulator Needle     Needle Length: 9cm  Needle Gauge: 21   Needle insertion depth: 6 cm   Additional Needles:   Procedures:,,,, ultrasound used (permanent image in chart),,,,  Narrative:  Start time: 09/10/2017 7:24 AM End time: 09/10/2017 7:44 AM Injection made incrementally with aspirations every 5 mL.  Performed by: Personally  Anesthesiologist: Piscitello, Precious Haws, MD  Additional Notes: Functioning IV was confirmed and monitors were applied.  A echogenic needle was used. Sterile prep,hand hygiene and sterile gloves were used. Minimal sedation used for procedure.   No paresthesia endorsed by patient during the procedure.  Negative aspiration and negative test dose prior to incremental administration of local anesthetic. The patient tolerated the procedure well with no immediate complications.

## 2017-09-10 NOTE — H&P (Signed)
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  The various methods of treatment have been discussed with the patient.  No guarantees were given.  After consideration of risks, benefits and other options for treatment, the patient has consented to surgery.  I have reviewed the patients' chart and labs.     A history and physical examination was performed in my office.  The patient was reexamined.  There have been no changes to this history and physical examination.  Samara Deist A

## 2017-09-10 NOTE — Anesthesia Post-op Follow-up Note (Signed)
Anesthesia QCDR form completed.        

## 2017-09-13 LAB — SURGICAL PATHOLOGY

## 2017-11-25 ENCOUNTER — Ambulatory Visit
Admission: RE | Admit: 2017-11-25 | Discharge: 2017-11-25 | Disposition: A | Discharge status: Home / Self Care | Payer: Medicare Other | Source: Ambulatory Visit | Attending: Pulmonary Disease | Admitting: Pulmonary Disease

## 2017-11-25 ENCOUNTER — Ambulatory Visit (HOSPITAL_COMMUNITY): Payer: Medicare Other

## 2017-11-25 DIAGNOSIS — M0510 Rheumatoid lung disease with rheumatoid arthritis of unspecified site: Secondary | ICD-10-CM | POA: Diagnosis not present

## 2017-11-25 DIAGNOSIS — J8410 Pulmonary fibrosis, unspecified: Secondary | ICD-10-CM | POA: Diagnosis present

## 2017-11-25 MED ORDER — ALBUTEROL SULFATE (2.5 MG/3ML) 0.083% IN NEBU
2.5000 mg | INHALATION_SOLUTION | Freq: Once | RESPIRATORY_TRACT | Status: AC
  Administered 2017-11-25: 2.5 mg via RESPIRATORY_TRACT
  Filled 2017-11-25: qty 3

## 2017-11-30 ENCOUNTER — Ambulatory Visit (INDEPENDENT_AMBULATORY_CARE_PROVIDER_SITE_OTHER): Payer: Medicare Other | Admitting: Pulmonary Disease

## 2017-11-30 ENCOUNTER — Encounter: Payer: Self-pay | Admitting: Pulmonary Disease

## 2017-11-30 ENCOUNTER — Ambulatory Visit: Payer: Medicare Other | Admitting: Pulmonary Disease

## 2017-11-30 VITALS — BP 102/70 | HR 78 | Ht 60.0 in | Wt 131.6 lb

## 2017-11-30 DIAGNOSIS — M0510 Rheumatoid lung disease with rheumatoid arthritis of unspecified site: Secondary | ICD-10-CM | POA: Diagnosis not present

## 2017-11-30 DIAGNOSIS — R05 Cough: Secondary | ICD-10-CM

## 2017-11-30 DIAGNOSIS — J8410 Pulmonary fibrosis, unspecified: Secondary | ICD-10-CM | POA: Diagnosis not present

## 2017-11-30 DIAGNOSIS — J42 Unspecified chronic bronchitis: Secondary | ICD-10-CM

## 2017-11-30 DIAGNOSIS — R059 Cough, unspecified: Secondary | ICD-10-CM

## 2017-11-30 MED ORDER — FLUTICASONE FUROATE-VILANTEROL 100-25 MCG/INH IN AEPB: 1.0000 | INHALATION_SPRAY | Freq: Every day | RESPIRATORY_TRACT | 5 refills | Status: DC

## 2017-11-30 NOTE — Patient Instructions (Signed)
Discontinue Spiriva inhaler Resume Breo inhaler, 1 inhalation daily.  Rinse mouth after use Follow-up in 6 months.  Call sooner if needed

## 2017-12-06 ENCOUNTER — Encounter: Payer: Self-pay | Admitting: Pulmonary Disease

## 2017-12-06 NOTE — Progress Notes (Signed)
PULMONARY OFFICE FOLLOW UP NOTE  PROBLEMS: Rheumatoid arthritis Pulmonary fibrosis - previously on MTX  Hospitalized 3/13 - 03/25/15 with acute (?on chronic) hypoxemic respiratory failure and severe pneumonitis pattern on CXR, CT chest. Treated with empiric antibiotics and systemic steroids with gradual improvement. Discharged home on tapering prednisone, home O2  DATA: 03/18/15 CT chest: Chronic pulmonary fibrosis, bibasilar predominant, likely related to the given history of rheumatoid arthritis. Diffuse patchy ground-glass opacities throughout both lungs. 05/07/15 CT chest: Persistence of fibrotic changes. Resolution of diffuse GGOs 05/08/15 PFTs: Mild restriction, mod decrease DLCO. Minimal desaturation on 6 min walk (to 92%) 05/16/15 Overnight oximetry: relatively brief period of desaturation but to a low of 68% 11/13/15 PFTs: no obstruction, mild restriction, DLCO measurement invalid  08/12/16 6MWT: 300 meters, SpO2 98>91% 11/24/16 PFTs: No obstruction, lung volumes low-normal, DLCO normal 11/08/16 CTA chest: [place] interstitial prominence  11/25/17 PFTs: FVC: 1.74 L (73 %pred), FEV1: 1.61 L (86 %pred), FEV1/FVC: 93%, TLC: 2.73 L (61 %pred), DLCO 94 %pred.  No change after bronchodilator challenge.  Flow volume curve consistent with mild restriction.  Overall minimal change from prior PFTs except in total lung capacity (which might be a spurious finding)    INTERVAL HISTORY: No major pulmonary events.  Underwent ankle surgery in August of this year.  SUBJ: This is a scheduled follow-up.  She has a chronic cough with some worsening in the past 6 weeks or so.  It is now rattling.  It is productive of clear to white mucus.  She denies hemoptysis and pleuritic chest pain.  She has no nocturnal cough.  Previously, she was on Breo inhaler.  More recently, she has been tried on Spiriva inhaler without improvement in her cough. Denies CP, fever, purulent sputum, hemoptysis, LE edema and calf  tenderness.   OBJ: Vitals:   11/30/17 1534 11/30/17 1537  BP:  102/70  Pulse:  78  SpO2:  96%  Weight: 131 lb 9.6 oz (59.7 kg)   Height: 5' (1.524 m)   RA  NAD HEENT WNL No JVD noted Bibasilar crackles, no wheezes RRR, no M NABS, soft Advanced chronic RA changes both hands No focal neurologic deficits    DATA: BMP Latest Ref Rng & Units 09/01/2017 11/10/2016 11/09/2016  Glucose 70 - 99 mg/dL 89 123(H) 95  BUN 8 - 23 mg/dL 12 13 8   Creatinine 0.44 - 1.00 mg/dL 0.94 0.75 0.67  Sodium 135 - 145 mmol/L 139 137 138  Potassium 3.5 - 5.1 mmol/L 4.2 3.6 3.9  Chloride 98 - 111 mmol/L 101 104 107  CO2 22 - 32 mmol/L 30 27 26   Calcium 8.9 - 10.3 mg/dL 9.2 8.4(L) 8.3(L)   CBC Latest Ref Rng & Units 11/11/2016 11/09/2016 11/08/2016  WBC 3.6 - 11.0 K/uL 6.5 12.5(H) 17.3(H)  Hemoglobin 12.0 - 16.0 g/dL 10.7(L) 11.3(L) 12.4  Hematocrit 35.0 - 47.0 % 32.1(L) 34.4(L) 37.1  Platelets 150 - 440 K/uL 169 156 177    CXR 11/21: No significant change in the mild interstitial prominence bilaterally  IMPRESSION: 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  Instructed to rinse her mouth after use.  Sample provided.  Prescription entered.  Follow-up in 6 months.  Call sooner if needed  Merton Border, MD PCCM service Mobile [phone] Pager [phone] 12/06/2017 12:26 PM

## 2017-12-15 ENCOUNTER — Other Ambulatory Visit: Payer: Self-pay | Admitting: Pulmonary Disease

## 2017-12-20 ENCOUNTER — Ambulatory Visit
Admission: RE | Admit: 2017-12-20 | Discharge: 2017-12-20 | Disposition: A | Discharge status: Home / Self Care | Payer: Medicare Other | Source: Ambulatory Visit | Attending: Internal Medicine | Admitting: Internal Medicine

## 2017-12-20 DIAGNOSIS — Z1231 Encounter for screening mammogram for malignant neoplasm of breast: Secondary | ICD-10-CM | POA: Diagnosis not present

## 2018-01-25 ENCOUNTER — Other Ambulatory Visit: Payer: Self-pay | Admitting: Orthopedic Surgery

## 2018-01-25 ENCOUNTER — Other Ambulatory Visit: Payer: Self-pay

## 2018-01-25 DIAGNOSIS — M1711 Unilateral primary osteoarthritis, right knee: Secondary | ICD-10-CM

## 2018-01-31 ENCOUNTER — Ambulatory Visit
Admission: RE | Admit: 2018-01-31 | Discharge: 2018-01-31 | Disposition: A | Discharge status: Home / Self Care | Payer: Medicare Other | Source: Ambulatory Visit | Attending: Orthopedic Surgery | Admitting: Orthopedic Surgery

## 2018-01-31 DIAGNOSIS — M1711 Unilateral primary osteoarthritis, right knee: Secondary | ICD-10-CM | POA: Diagnosis not present

## 2018-03-07 ENCOUNTER — Other Ambulatory Visit: Payer: Self-pay | Admitting: Internal Medicine

## 2018-05-13 ENCOUNTER — Other Ambulatory Visit: Payer: Self-pay | Admitting: Internal Medicine

## 2018-06-08 ENCOUNTER — Other Ambulatory Visit: Payer: Self-pay | Admitting: Pulmonary Disease

## 2018-06-08 NOTE — Telephone Encounter (Signed)
DS please advise on refill. Thanks

## 2018-07-07 ENCOUNTER — Other Ambulatory Visit: Payer: Self-pay | Admitting: Internal Medicine

## 2018-07-14 ENCOUNTER — Other Ambulatory Visit: Payer: Self-pay | Admitting: Internal Medicine

## 2018-12-06 ENCOUNTER — Other Ambulatory Visit: Payer: Self-pay | Admitting: Internal Medicine

## 2018-12-06 DIAGNOSIS — R270 Ataxia, unspecified: Secondary | ICD-10-CM

## 2018-12-06 DIAGNOSIS — R413 Other amnesia: Secondary | ICD-10-CM

## 2018-12-06 DIAGNOSIS — R42 Dizziness and giddiness: Secondary | ICD-10-CM

## 2018-12-12 ENCOUNTER — Ambulatory Visit
Admission: RE | Admit: 2018-12-12 | Discharge: 2018-12-12 | Disposition: A | Discharge status: Home / Self Care | Payer: Medicare Other | Source: Ambulatory Visit | Attending: Internal Medicine | Admitting: Internal Medicine

## 2018-12-12 ENCOUNTER — Other Ambulatory Visit: Payer: Self-pay

## 2018-12-12 DIAGNOSIS — R413 Other amnesia: Secondary | ICD-10-CM | POA: Insufficient documentation

## 2018-12-12 DIAGNOSIS — R42 Dizziness and giddiness: Secondary | ICD-10-CM | POA: Diagnosis not present

## 2018-12-12 DIAGNOSIS — R270 Ataxia, unspecified: Secondary | ICD-10-CM

## 2018-12-21 ENCOUNTER — Other Ambulatory Visit: Payer: Self-pay | Admitting: Internal Medicine

## 2018-12-21 DIAGNOSIS — Z1231 Encounter for screening mammogram for malignant neoplasm of breast: Secondary | ICD-10-CM

## 2019-01-11 ENCOUNTER — Other Ambulatory Visit: Payer: Self-pay | Admitting: Pulmonary Disease

## 2019-01-19 ENCOUNTER — Other Ambulatory Visit: Payer: Self-pay

## 2019-01-19 ENCOUNTER — Ambulatory Visit: Payer: Medicare Other | Admitting: Pulmonary Disease

## 2019-01-19 ENCOUNTER — Encounter: Payer: Self-pay | Admitting: Pulmonary Disease

## 2019-01-19 VITALS — BP 126/78 | HR 70 | Temp 97.3°F | Ht 62.0 in | Wt 125.8 lb

## 2019-01-19 DIAGNOSIS — R05 Cough: Secondary | ICD-10-CM

## 2019-01-19 DIAGNOSIS — J8410 Pulmonary fibrosis, unspecified: Secondary | ICD-10-CM

## 2019-01-19 DIAGNOSIS — M0510 Rheumatoid lung disease with rheumatoid arthritis of unspecified site: Secondary | ICD-10-CM

## 2019-01-19 DIAGNOSIS — J42 Unspecified chronic bronchitis: Secondary | ICD-10-CM

## 2019-01-19 DIAGNOSIS — R053 Chronic cough: Secondary | ICD-10-CM

## 2019-01-19 NOTE — Patient Instructions (Addendum)
1.  You seem to doing well from the lung standpoint.  2.  We will see you back in 1 year please call sooner should any new difficulties arise

## 2019-01-19 NOTE — Progress Notes (Signed)
 Assessment & Plan:  1. Pulmonary fibrosis (HCC) (Primary)  2. Rheumatoid lung (HCC)  3. Chronic bronchitis, unspecified chronic bronchitis type (HCC)  4. Chronic cough   Patient Instructions  1.  You seem to doing well from the lung standpoint.  2.  We will see you back in 1 year please call sooner should any new difficulties arise  Please note: late entry documentation due to logistical difficulties during COVID-19 pandemic. This note is filed for information purposes only, and is not intended to be used for billing, nor does it represent the full scope/nature of the visit in question. Please see any associated scanned media linked to date of encounter for additional pertinent information.  Subjective:    HPI: Stephanie Patel is a 74 y.o. female presenting to the pulmonology clinic on 01/19/2019 with report of: Follow-up (previous DS pt- Pt states her breathing is ok. She has not noticed any changes. Has a croupy cough in the am. Pt denies any wheezing, fever, or chills.)   PROBLEMS: Rheumatoid arthritis Pulmonary fibrosis - previously on MTX   Hospitalized 3/13 - 03/25/15 with acute (?on chronic) hypoxemic respiratory failure and severe pneumonitis pattern on CXR, CT chest. Treated with empiric antibiotics and systemic steroids with gradual improvement. Discharged home on tapering prednisone , home O2   DATA: 03/18/15 CT chest: Chronic pulmonary fibrosis, bibasilar predominant, likely related to the given history of rheumatoid arthritis. Diffuse patchy ground-glass opacities throughout both lungs. 05/07/15 CT chest: Persistence of fibrotic changes. Resolution of diffuse GGOs 05/08/15 PFTs: Mild restriction, mod decrease DLCO. Minimal desaturation on 6 min walk (to 92%) 05/16/15 Overnight oximetry: relatively brief period of desaturation but to a low of 68% 11/13/15 PFTs: no obstruction, mild restriction, DLCO measurement invalid  08/12/16 : 300 meters, SpO2  98>91% 11/24/16 PFTs: No obstruction, lung volumes low-normal, DLCO normal 11/08/16 CTA chest: [place] interstitial prominence  11/25/17 PFTs: FVC: 1.74 L (73 %pred), FEV1: 1.61 L (86 %pred), FEV1/FVC: 93%, TLC: 2.73 L (61 %pred), DLCO 94 %pred.  No change after bronchodilator challenge.  Flow volume curve consistent with mild restriction.  Overall minimal change from prior PFTs except in total lung capacity (which might be a spurious finding)     INTERVAL HISTORY: Last visit with Dr. Linard November 30, 2017 no major pulmonary events since that visit.    No major interventions since that visit.  Outpatient Encounter Medications as of 01/19/2019  Medication Sig Note   [EXPIRED] cyanocobalamin  1000 MCG tablet Take by mouth.    [DISCONTINUED] acidophilus (RISAQUAD) CAPS capsule Take 1 capsule by mouth daily.    [DISCONTINUED] Adalimumab (HUMIRA PEN) 40 MG/0.4ML PNKT  09/19/2019: Uses every other Sunday   [DISCONTINUED] bisoprolol (ZEBETA) 5 MG tablet Take 5 mg by mouth daily.    [DISCONTINUED] BREO ELLIPTA  100-25 MCG/INH AEPB INHALE 1 PUFF INTO THE LUNGS DAILY AS DIRECTED (Patient not taking: INHALE 1 PUFF INTO THE LUNGS DAILY AS DIRECTED)    [DISCONTINUED] buPROPion  (WELLBUTRIN  XL) 300 MG 24 hr tablet Take 300 mg by mouth daily.     [DISCONTINUED] busPIRone  (BUSPAR ) 15 MG tablet Take 15 mg by mouth 2 (two) times daily. (Patient not taking: Reported on 09/19/2019)    [DISCONTINUED] calcium  carbonate (OSCAL) 1500 (600 Ca) MG TABS tablet Take 600 mg by mouth at bedtime.    [DISCONTINUED] dicyclomine  (BENTYL ) 20 MG tablet Take 20 mg by mouth 2 (two) times daily.     [DISCONTINUED] fluticasone  (FLONASE ) 50 MCG/ACT nasal spray Place 1 spray into both  nostrils daily as needed for allergies.    [DISCONTINUED] hydroxychloroquine  (PLAQUENIL ) 200 MG tablet Take 400 mg by mouth daily.     [DISCONTINUED] leflunomide  (ARAVA ) 20 MG tablet Take 20 mg by mouth daily.    [DISCONTINUED] Nutritional Supplements  (NUTRITIONAL DRINK PO) Take 325 mLs by mouth daily. [employer] Meal Replacement Shake 1.0 cal/ml (with fruit blended) Dairy-Free & Gluten Free    [DISCONTINUED] omeprazole (PRILOSEC) 40 MG capsule Take 40 mg by mouth daily before breakfast.     [DISCONTINUED] OXYGEN Inhale 2 L into the lungs at bedtime as needed.     [DISCONTINUED] Potassium 99 MG TABS Take 99 mg by mouth daily.    [DISCONTINUED] promethazine  (PHENERGAN ) 12.5 MG tablet Take 1 tablet (12.5 mg total) by mouth every 6 (six) hours as needed for nausea.    [DISCONTINUED] risperiDONE  (RISPERDAL ) 0.5 MG tablet Take 0.5 mg by mouth at bedtime.    [DISCONTINUED] tetrahydrozoline 0.05 % ophthalmic solution Place 1 drop into both eyes 3 (three) times daily as needed (itchy eyes/redness.).    [DISCONTINUED] tolterodine (DETROL LA) 4 MG 24 hr capsule Take 4 mg by mouth at bedtime.     [DISCONTINUED] traZODone  (DESYREL ) 50 MG tablet TAKE ONE-HALF TABLET BY MOUTH AT BEDTIME    [DISCONTINUED] valACYclovir  (VALTREX ) 500 MG tablet Take 500 mg by mouth 2 (two) times a week. Tuesday & Friday    [DISCONTINUED] Ascorbic Acid  (VITAMIN C) 1000 MG tablet Take 1,000 mg by mouth daily.    [DISCONTINUED] benzonatate  (TESSALON ) 200 MG capsule Take 1 capsule (200 mg total) by mouth 3 (three) times daily as needed for cough. (Patient not taking: Reported on 01/19/2019)    [DISCONTINUED] guaifenesin  (HUMIBID E) 400 MG TABS tablet Take 400 mg by mouth daily as needed.     No facility-administered encounter medications on file as of 01/19/2019.      Objective:   Vitals:   01/19/19 1029  BP: 126/78  Pulse: 70  Temp: (!) 97.3 F (36.3 C)  Height: 5' 2 (1.575 m)  Weight: 125 lb 12.8 oz (57.1 kg)  SpO2: 95% Comment: on ra  TempSrc: Temporal  BMI (Calculated): 23     Physical exam documentation is limited by delayed entry of information.

## 2019-01-25 ENCOUNTER — Ambulatory Visit
Admission: RE | Admit: 2019-01-25 | Discharge: 2019-01-25 | Disposition: A | Discharge status: Home / Self Care | Payer: Medicare Other | Source: Ambulatory Visit | Attending: Internal Medicine | Admitting: Internal Medicine

## 2019-01-25 DIAGNOSIS — Z1231 Encounter for screening mammogram for malignant neoplasm of breast: Secondary | ICD-10-CM | POA: Diagnosis present

## 2019-01-30 ENCOUNTER — Encounter: Payer: Medicare Other | Admitting: Speech Pathology

## 2019-01-30 ENCOUNTER — Ambulatory Visit: Payer: Medicare Other | Admitting: Speech Pathology

## 2019-02-02 ENCOUNTER — Other Ambulatory Visit: Payer: Self-pay

## 2019-02-02 ENCOUNTER — Ambulatory Visit: Payer: Medicare Other | Attending: Neurology | Admitting: Speech Pathology

## 2019-02-02 ENCOUNTER — Encounter: Payer: Self-pay | Admitting: Speech Pathology

## 2019-02-02 DIAGNOSIS — R41841 Cognitive communication deficit: Secondary | ICD-10-CM | POA: Diagnosis present
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
## 2019-02-06 ENCOUNTER — Other Ambulatory Visit: Payer: Self-pay

## 2019-02-06 ENCOUNTER — Ambulatory Visit: Payer: Medicare Other | Attending: Neurology | Admitting: Speech Pathology

## 2019-02-06 ENCOUNTER — Encounter: Payer: Self-pay | Admitting: Speech Pathology

## 2019-02-06 DIAGNOSIS — R41841 Cognitive communication deficit: Secondary | ICD-10-CM
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
## 2019-02-09 ENCOUNTER — Ambulatory Visit: Payer: Medicare Other | Admitting: Speech Pathology

## 2019-02-13 ENCOUNTER — Encounter: Payer: Self-pay | Admitting: Speech Pathology

## 2019-02-13 ENCOUNTER — Ambulatory Visit: Payer: Medicare Other | Admitting: Speech Pathology

## 2019-02-13 ENCOUNTER — Other Ambulatory Visit: Payer: Self-pay

## 2019-02-13 DIAGNOSIS — R41841 Cognitive communication deficit: Secondary | ICD-10-CM
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
## 2019-02-16 ENCOUNTER — Ambulatory Visit: Payer: Medicare Other | Admitting: Speech Pathology

## 2019-02-20 ENCOUNTER — Other Ambulatory Visit: Payer: Self-pay

## 2019-02-20 ENCOUNTER — Encounter: Payer: Self-pay | Admitting: Speech Pathology

## 2019-02-20 ENCOUNTER — Ambulatory Visit: Payer: Medicare Other | Admitting: Speech Pathology

## 2019-02-20 DIAGNOSIS — R41841 Cognitive communication deficit: Secondary | ICD-10-CM | POA: Diagnosis not present
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
## 2019-02-23 ENCOUNTER — Ambulatory Visit: Payer: Medicare Other | Admitting: Speech Pathology

## 2019-02-27 ENCOUNTER — Ambulatory Visit: Payer: Medicare Other | Admitting: Speech Pathology

## 2019-03-02 ENCOUNTER — Ambulatory Visit: Payer: Medicare Other | Admitting: Speech Pathology

## 2019-03-06 ENCOUNTER — Ambulatory Visit: Payer: Medicare Other | Admitting: Speech Pathology

## 2019-03-09 ENCOUNTER — Ambulatory Visit: Payer: Medicare Other | Attending: Internal Medicine | Admitting: Speech Pathology

## 2019-03-09 DIAGNOSIS — R2681 Unsteadiness on feet: Secondary | ICD-10-CM | POA: Insufficient documentation

## 2019-03-09 DIAGNOSIS — R262 Difficulty in walking, not elsewhere classified: Secondary | ICD-10-CM | POA: Insufficient documentation

## 2019-03-09 DIAGNOSIS — G8929 Other chronic pain: Secondary | ICD-10-CM | POA: Insufficient documentation

## 2019-03-09 DIAGNOSIS — M6281 Muscle weakness (generalized): Secondary | ICD-10-CM | POA: Insufficient documentation

## 2019-03-09 DIAGNOSIS — M25561 Pain in right knee: Secondary | ICD-10-CM | POA: Insufficient documentation

## 2019-03-13 ENCOUNTER — Ambulatory Visit: Payer: Medicare Other | Admitting: Speech Pathology

## 2019-03-16 ENCOUNTER — Ambulatory Visit: Payer: Medicare Other | Admitting: Speech Pathology

## 2019-03-21 ENCOUNTER — Other Ambulatory Visit: Payer: Self-pay

## 2019-03-21 ENCOUNTER — Ambulatory Visit: Payer: Medicare Other

## 2019-03-21 DIAGNOSIS — M25561 Pain in right knee: Secondary | ICD-10-CM | POA: Diagnosis present

## 2019-03-21 DIAGNOSIS — G8929 Other chronic pain: Secondary | ICD-10-CM | POA: Diagnosis present

## 2019-03-21 DIAGNOSIS — R2681 Unsteadiness on feet: Secondary | ICD-10-CM

## 2019-03-21 DIAGNOSIS — M6281 Muscle weakness (generalized): Secondary | ICD-10-CM

## 2019-03-21 DIAGNOSIS — R262 Difficulty in walking, not elsewhere classified: Secondary | ICD-10-CM | POA: Diagnosis present
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
## 2019-03-27 ENCOUNTER — Ambulatory Visit: Payer: Medicare Other

## 2019-03-29 ENCOUNTER — Other Ambulatory Visit: Payer: Self-pay | Admitting: Pulmonary Disease

## 2019-03-29 ENCOUNTER — Ambulatory Visit: Payer: Medicare Other

## 2019-04-03 ENCOUNTER — Other Ambulatory Visit: Payer: Self-pay

## 2019-04-03 ENCOUNTER — Ambulatory Visit: Payer: Medicare Other

## 2019-04-03 DIAGNOSIS — R262 Difficulty in walking, not elsewhere classified: Secondary | ICD-10-CM

## 2019-04-03 DIAGNOSIS — R2681 Unsteadiness on feet: Secondary | ICD-10-CM

## 2019-04-03 DIAGNOSIS — M6281 Muscle weakness (generalized): Secondary | ICD-10-CM

## 2019-04-03 DIAGNOSIS — G8929 Other chronic pain: Secondary | ICD-10-CM
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
## 2019-04-03 NOTE — Patient Instructions (Signed)
Seated hip extension isometrics   Sitting on a chair,    Squeeze your rear end muscles together and press your left  foot onto the floor.     Hold for 5 seconds      Then press your right foot onto the floor   Hold for 5 seconds    Repeat 10 times   Perform 3 sets daily.

## 2019-04-05 ENCOUNTER — Other Ambulatory Visit: Payer: Self-pay

## 2019-04-05 ENCOUNTER — Ambulatory Visit: Payer: Medicare Other

## 2019-04-05 DIAGNOSIS — R262 Difficulty in walking, not elsewhere classified: Secondary | ICD-10-CM

## 2019-04-05 DIAGNOSIS — M6281 Muscle weakness (generalized): Secondary | ICD-10-CM

## 2019-04-05 DIAGNOSIS — R2681 Unsteadiness on feet: Secondary | ICD-10-CM | POA: Diagnosis not present

## 2019-04-05 DIAGNOSIS — G8929 Other chronic pain: Secondary | ICD-10-CM
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
## 2019-04-10 ENCOUNTER — Ambulatory Visit: Payer: Medicare Other

## 2019-04-12 ENCOUNTER — Ambulatory Visit: Payer: Medicare Other

## 2019-04-17 ENCOUNTER — Ambulatory Visit: Payer: Medicare Other | Attending: Internal Medicine

## 2019-04-17 ENCOUNTER — Other Ambulatory Visit: Payer: Self-pay

## 2019-04-17 DIAGNOSIS — R262 Difficulty in walking, not elsewhere classified: Secondary | ICD-10-CM | POA: Diagnosis present

## 2019-04-17 DIAGNOSIS — R2681 Unsteadiness on feet: Secondary | ICD-10-CM | POA: Diagnosis not present

## 2019-04-17 DIAGNOSIS — M6281 Muscle weakness (generalized): Secondary | ICD-10-CM | POA: Insufficient documentation
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
## 2019-04-19 ENCOUNTER — Ambulatory Visit: Payer: Medicare Other

## 2019-04-25 ENCOUNTER — Ambulatory Visit: Payer: Medicare Other

## 2019-04-27 ENCOUNTER — Ambulatory Visit: Payer: Medicare Other

## 2019-05-01 ENCOUNTER — Ambulatory Visit: Payer: Medicare Other

## 2019-05-01 ENCOUNTER — Telehealth: Payer: Self-pay

## 2019-05-01 NOTE — Telephone Encounter (Signed)
No show. Called patient who said that she is having pollen allergy problems and forgot to call and cancel today's session. Wants to hold off PT for now and discharge until she feels better and gets it taken care of. Balance is improving a bit and has not fallen since she started PT.

## 2019-05-04 ENCOUNTER — Ambulatory Visit: Payer: Medicare Other

## 2019-07-06 ENCOUNTER — Other Ambulatory Visit: Payer: Self-pay | Admitting: Rheumatology

## 2019-07-06 ENCOUNTER — Other Ambulatory Visit (HOSPITAL_COMMUNITY): Payer: Self-pay | Admitting: Rheumatology

## 2019-07-06 DIAGNOSIS — M25511 Pain in right shoulder: Secondary | ICD-10-CM

## 2019-07-06 DIAGNOSIS — M0579 Rheumatoid arthritis with rheumatoid factor of multiple sites without organ or systems involvement: Secondary | ICD-10-CM

## 2019-07-21 ENCOUNTER — Ambulatory Visit: Payer: Medicare Other

## 2019-07-26 ENCOUNTER — Emergency Department
Admission: EM | Admit: 2019-07-26 | Discharge: 2019-07-26 | Disposition: A | Discharge status: Home / Self Care | Payer: Medicare Other | Attending: Emergency Medicine | Admitting: Emergency Medicine

## 2019-07-26 ENCOUNTER — Other Ambulatory Visit: Payer: Self-pay

## 2019-07-26 DIAGNOSIS — Z87891 Personal history of nicotine dependence: Secondary | ICD-10-CM | POA: Insufficient documentation

## 2019-07-26 DIAGNOSIS — R197 Diarrhea, unspecified: Secondary | ICD-10-CM

## 2019-07-26 DIAGNOSIS — M069 Rheumatoid arthritis, unspecified: Secondary | ICD-10-CM | POA: Diagnosis not present

## 2019-07-26 DIAGNOSIS — Z79899 Other long term (current) drug therapy: Secondary | ICD-10-CM | POA: Insufficient documentation

## 2019-07-26 DIAGNOSIS — K219 Gastro-esophageal reflux disease without esophagitis: Secondary | ICD-10-CM | POA: Diagnosis not present

## 2019-07-26 DIAGNOSIS — I10 Essential (primary) hypertension: Secondary | ICD-10-CM | POA: Insufficient documentation

## 2019-07-26 DIAGNOSIS — J849 Interstitial pulmonary disease, unspecified: Secondary | ICD-10-CM | POA: Insufficient documentation

## 2019-07-26 DIAGNOSIS — Z7951 Long term (current) use of inhaled steroids: Secondary | ICD-10-CM | POA: Diagnosis not present

## 2019-07-26 LAB — CBC
HCT: 34.7 % — ABNORMAL LOW (ref 36.0–46.0)
Hemoglobin: 11.8 g/dL — ABNORMAL LOW (ref 12.0–15.0)
MCH: 32.4 pg (ref 26.0–34.0)
MCHC: 34 g/dL (ref 30.0–36.0)
MCV: 95.3 fL (ref 80.0–100.0)
Platelets: 181 10*3/uL (ref 150–400)
RBC: 3.64 MIL/uL — ABNORMAL LOW (ref 3.87–5.11)
RDW: 14.3 % (ref 11.5–15.5)
WBC: 4.9 10*3/uL (ref 4.0–10.5)
nRBC: 0 % (ref 0.0–0.2)

## 2019-07-26 LAB — GASTROINTESTINAL PANEL BY PCR, STOOL (REPLACES STOOL CULTURE)

## 2019-07-26 LAB — COMPREHENSIVE METABOLIC PANEL
ALT: 18 U/L (ref 0–44)
AST: 26 U/L (ref 15–41)
Albumin: 3.4 g/dL — ABNORMAL LOW (ref 3.5–5.0)
Alkaline Phosphatase: 45 U/L (ref 38–126)
Anion gap: 10 (ref 5–15)
BUN: 10 mg/dL (ref 8–23)
CO2: 26 mmol/L (ref 22–32)
Calcium: 8.2 mg/dL — ABNORMAL LOW (ref 8.9–10.3)
Chloride: 103 mmol/L (ref 98–111)
Creatinine, Ser: 0.74 mg/dL (ref 0.44–1.00)
GFR calc Af Amer: >60 mL/min (ref >60)
GFR calc non Af Amer: >60 mL/min (ref >60)
Glucose, Bld: 96 mg/dL (ref 70–99)
Potassium: 3.1 mmol/L — ABNORMAL LOW (ref 3.5–5.1)
Sodium: 139 mmol/L (ref 135–145)
Total Bilirubin: 0.9 mg/dL (ref 0.3–1.2)
Total Protein: 6 g/dL — ABNORMAL LOW (ref 6.5–8.1)

## 2019-07-26 LAB — URINALYSIS, COMPLETE (UACMP) WITH MICROSCOPIC
Bacteria, UA: NONE SEEN
Bilirubin Urine: NEGATIVE
Glucose, UA: NEGATIVE mg/dL
Hgb urine dipstick: NEGATIVE
Ketones, ur: NEGATIVE mg/dL
Nitrite: NEGATIVE
Protein, ur: NEGATIVE mg/dL
Specific Gravity, Urine: 1.019 (ref 1.005–1.030)
pH: 5 (ref 5.0–8.0)

## 2019-07-26 LAB — C DIFFICILE QUICK SCREEN W PCR REFLEX
C Diff antigen: NEGATIVE
C Diff interpretation: NOT DETECTED
C Diff toxin: NEGATIVE

## 2019-07-26 LAB — LIPASE, BLOOD: Lipase: 24 U/L (ref 11–51)

## 2019-07-26 MED ORDER — POTASSIUM CHLORIDE 20 MEQ/15ML (10%) PO SOLN
40.0000 meq | Freq: Once | ORAL | Status: AC
  Administered 2019-07-26: 40 meq via ORAL
  Filled 2019-07-26: qty 30

## 2019-07-26 MED ORDER — SODIUM CHLORIDE 0.9 % IV BOLUS
1000.0000 mL | Freq: Once | INTRAVENOUS | Status: AC
  Administered 2019-07-26: 1000 mL via INTRAVENOUS
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
## 2019-07-26 NOTE — ED Triage Notes (Signed)
Pt arrives to ER for diarrhea x 2 weeks. States being treated for diverticulitis (on cipro and flagyl). Started antibiotics a week ago. PCP sent pt here for blood work to check to make sure electrolytes are off. Denies fever and vomiting. No abd pain. A&O, ambulatory.

## 2019-07-26 NOTE — Discharge Instructions (Addendum)
The C. difficile and the stool panel were negative.  Your white blood count is also normal and your belly is not tender anymore.  I think you can stop the antibiotics.  You can try 1 or 2 doses of Imodium at home or similar over-the-counter medicine.  The diarrhea should slow down after that.  Please follow-up with your primary care doctor if you are still having diarrhea in 2 or 3 days.  You can try to eat yogurt with active cultures as well.  This may also help.  Have your doctor check on your blood pressure as well.  It has been high here today.

## 2019-07-27 LAB — URINE CULTURE

## 2019-08-04 ENCOUNTER — Other Ambulatory Visit: Payer: Self-pay

## 2019-08-04 ENCOUNTER — Ambulatory Visit
Admission: RE | Admit: 2019-08-04 | Discharge: 2019-08-04 | Disposition: A | Discharge status: Home / Self Care | Payer: Medicare Other | Source: Ambulatory Visit | Attending: Rheumatology | Admitting: Rheumatology

## 2019-08-04 DIAGNOSIS — M0579 Rheumatoid arthritis with rheumatoid factor of multiple sites without organ or systems involvement: Secondary | ICD-10-CM

## 2019-08-04 DIAGNOSIS — M25511 Pain in right shoulder: Secondary | ICD-10-CM | POA: Diagnosis not present

## 2019-08-10 ENCOUNTER — Other Ambulatory Visit: Payer: Self-pay | Admitting: Student

## 2019-08-10 DIAGNOSIS — M0609 Rheumatoid arthritis without rheumatoid factor, multiple sites: Secondary | ICD-10-CM

## 2019-08-10 DIAGNOSIS — M19011 Primary osteoarthritis, right shoulder: Secondary | ICD-10-CM

## 2019-08-10 DIAGNOSIS — M75121 Complete rotator cuff tear or rupture of right shoulder, not specified as traumatic: Secondary | ICD-10-CM

## 2019-08-14 ENCOUNTER — Ambulatory Visit
Admission: RE | Admit: 2019-08-14 | Discharge: 2019-08-14 | Disposition: A | Discharge status: Home / Self Care | Payer: Medicare Other | Source: Ambulatory Visit | Attending: Student | Admitting: Student

## 2019-08-14 ENCOUNTER — Other Ambulatory Visit: Payer: Self-pay

## 2019-08-14 DIAGNOSIS — M0609 Rheumatoid arthritis without rheumatoid factor, multiple sites: Secondary | ICD-10-CM | POA: Diagnosis not present

## 2019-08-14 DIAGNOSIS — M75121 Complete rotator cuff tear or rupture of right shoulder, not specified as traumatic: Secondary | ICD-10-CM | POA: Diagnosis present

## 2019-08-14 DIAGNOSIS — M19011 Primary osteoarthritis, right shoulder: Secondary | ICD-10-CM | POA: Diagnosis present

## 2019-08-25 ENCOUNTER — Other Ambulatory Visit: Payer: Self-pay | Admitting: Internal Medicine

## 2019-08-25 DIAGNOSIS — K580 Irritable bowel syndrome with diarrhea: Secondary | ICD-10-CM

## 2019-08-31 ENCOUNTER — Other Ambulatory Visit: Payer: Self-pay

## 2019-08-31 ENCOUNTER — Ambulatory Visit
Admission: RE | Admit: 2019-08-31 | Discharge: 2019-08-31 | Disposition: A | Discharge status: Home / Self Care | Payer: Medicare Other | Source: Ambulatory Visit | Attending: Internal Medicine | Admitting: Internal Medicine

## 2019-08-31 DIAGNOSIS — K580 Irritable bowel syndrome with diarrhea: Secondary | ICD-10-CM | POA: Insufficient documentation

## 2019-09-15 ENCOUNTER — Other Ambulatory Visit
Admission: RE | Admit: 2019-09-15 | Discharge: 2019-09-15 | Disposition: A | Discharge status: Home / Self Care | Payer: Medicare Other | Source: Ambulatory Visit | Attending: Gastroenterology | Admitting: Gastroenterology

## 2019-09-15 ENCOUNTER — Other Ambulatory Visit: Payer: Self-pay

## 2019-09-15 DIAGNOSIS — Z20822 Contact with and (suspected) exposure to covid-19: Secondary | ICD-10-CM | POA: Insufficient documentation

## 2019-09-15 DIAGNOSIS — Z01812 Encounter for preprocedural laboratory examination: Secondary | ICD-10-CM | POA: Insufficient documentation

## 2019-09-16 LAB — SARS CORONAVIRUS 2 (TAT 6-24 HRS): SARS Coronavirus 2: NEGATIVE

## 2019-09-18 ENCOUNTER — Encounter: Payer: Self-pay | Admitting: *Deleted

## 2019-09-19 ENCOUNTER — Encounter: Payer: Self-pay | Admitting: Anesthesiology

## 2019-09-19 ENCOUNTER — Encounter: Payer: Self-pay | Admitting: Emergency Medicine

## 2019-09-19 ENCOUNTER — Other Ambulatory Visit: Payer: Self-pay

## 2019-09-19 ENCOUNTER — Inpatient Hospital Stay
Admission: EM | Admit: 2019-09-19 | Discharge: 2019-09-22 | DRG: 379 | Disposition: A | Discharge status: Home Health | Payer: Medicare Other | Attending: Internal Medicine | Admitting: Internal Medicine

## 2019-09-19 ENCOUNTER — Encounter: Admission: RE | Payer: Self-pay | Source: Home / Self Care

## 2019-09-19 ENCOUNTER — Observation Stay: Payer: Medicare Other

## 2019-09-19 ENCOUNTER — Ambulatory Visit: Admission: RE | Admit: 2019-09-19 | Payer: Medicare Other | Source: Home / Self Care

## 2019-09-19 DIAGNOSIS — Z91010 Allergy to peanuts: Secondary | ICD-10-CM

## 2019-09-19 DIAGNOSIS — E876 Hypokalemia: Secondary | ICD-10-CM | POA: Diagnosis not present

## 2019-09-19 DIAGNOSIS — K5731 Diverticulosis of large intestine without perforation or abscess with bleeding: Secondary | ICD-10-CM | POA: Diagnosis not present

## 2019-09-19 DIAGNOSIS — J849 Interstitial pulmonary disease, unspecified: Secondary | ICD-10-CM | POA: Diagnosis present

## 2019-09-19 DIAGNOSIS — Z885 Allergy status to narcotic agent status: Secondary | ICD-10-CM

## 2019-09-19 DIAGNOSIS — K529 Noninfective gastroenteritis and colitis, unspecified: Secondary | ICD-10-CM | POA: Diagnosis present

## 2019-09-19 DIAGNOSIS — Z87891 Personal history of nicotine dependence: Secondary | ICD-10-CM

## 2019-09-19 DIAGNOSIS — R197 Diarrhea, unspecified: Secondary | ICD-10-CM

## 2019-09-19 DIAGNOSIS — R011 Cardiac murmur, unspecified: Secondary | ICD-10-CM | POA: Diagnosis present

## 2019-09-19 DIAGNOSIS — K922 Gastrointestinal hemorrhage, unspecified: Secondary | ICD-10-CM | POA: Diagnosis present

## 2019-09-19 DIAGNOSIS — F329 Major depressive disorder, single episode, unspecified: Secondary | ICD-10-CM | POA: Diagnosis present

## 2019-09-19 DIAGNOSIS — R109 Unspecified abdominal pain: Secondary | ICD-10-CM

## 2019-09-19 DIAGNOSIS — Z7952 Long term (current) use of systemic steroids: Secondary | ICD-10-CM

## 2019-09-19 DIAGNOSIS — Z8249 Family history of ischemic heart disease and other diseases of the circulatory system: Secondary | ICD-10-CM

## 2019-09-19 DIAGNOSIS — K5791 Diverticulosis of intestine, part unspecified, without perforation or abscess with bleeding: Secondary | ICD-10-CM

## 2019-09-19 DIAGNOSIS — K219 Gastro-esophageal reflux disease without esophagitis: Secondary | ICD-10-CM | POA: Diagnosis present

## 2019-09-19 DIAGNOSIS — Z7951 Long term (current) use of inhaled steroids: Secondary | ICD-10-CM

## 2019-09-19 DIAGNOSIS — Z8371 Family history of colonic polyps: Secondary | ICD-10-CM

## 2019-09-19 DIAGNOSIS — K449 Diaphragmatic hernia without obstruction or gangrene: Secondary | ICD-10-CM | POA: Diagnosis present

## 2019-09-19 DIAGNOSIS — Z7983 Long term (current) use of bisphosphonates: Secondary | ICD-10-CM

## 2019-09-19 DIAGNOSIS — Z20822 Contact with and (suspected) exposure to covid-19: Secondary | ICD-10-CM | POA: Diagnosis present

## 2019-09-19 DIAGNOSIS — Z820 Family history of epilepsy and other diseases of the nervous system: Secondary | ICD-10-CM

## 2019-09-19 DIAGNOSIS — E559 Vitamin D deficiency, unspecified: Secondary | ICD-10-CM | POA: Diagnosis present

## 2019-09-19 DIAGNOSIS — Z79899 Other long term (current) drug therapy: Secondary | ICD-10-CM

## 2019-09-19 DIAGNOSIS — I10 Essential (primary) hypertension: Secondary | ICD-10-CM | POA: Diagnosis present

## 2019-09-19 DIAGNOSIS — M0510 Rheumatoid lung disease with rheumatoid arthritis of unspecified site: Secondary | ICD-10-CM | POA: Diagnosis present

## 2019-09-19 DIAGNOSIS — K8020 Calculus of gallbladder without cholecystitis without obstruction: Secondary | ICD-10-CM | POA: Diagnosis present

## 2019-09-19 DIAGNOSIS — J8410 Pulmonary fibrosis, unspecified: Secondary | ICD-10-CM | POA: Diagnosis present

## 2019-09-19 DIAGNOSIS — R339 Retention of urine, unspecified: Secondary | ICD-10-CM | POA: Diagnosis not present

## 2019-09-19 DIAGNOSIS — M1710 Unilateral primary osteoarthritis, unspecified knee: Secondary | ICD-10-CM | POA: Diagnosis present

## 2019-09-19 DIAGNOSIS — F32A Depression, unspecified: Secondary | ICD-10-CM | POA: Diagnosis present

## 2019-09-19 DIAGNOSIS — M0609 Rheumatoid arthritis without rheumatoid factor, multiple sites: Secondary | ICD-10-CM | POA: Diagnosis present

## 2019-09-19 DIAGNOSIS — Z888 Allergy status to other drugs, medicaments and biological substances status: Secondary | ICD-10-CM

## 2019-09-19 LAB — CBC
HCT: 39 % (ref 36.0–46.0)
Hemoglobin: 13.2 g/dL (ref 12.0–15.0)
MCH: 31.6 pg (ref 26.0–34.0)
MCHC: 33.8 g/dL (ref 30.0–36.0)
MCV: 93.3 fL (ref 80.0–100.0)
Platelets: 220 10*3/uL (ref 150–400)
RBC: 4.18 MIL/uL (ref 3.87–5.11)
RDW: 13.4 % (ref 11.5–15.5)
WBC: 7.1 10*3/uL (ref 4.0–10.5)
nRBC: 0 % (ref 0.0–0.2)

## 2019-09-19 LAB — COMPREHENSIVE METABOLIC PANEL
ALT: 14 U/L (ref 0–44)
AST: 23 U/L (ref 15–41)
Albumin: 3.8 g/dL (ref 3.5–5.0)
Alkaline Phosphatase: 58 U/L (ref 38–126)
Anion gap: 12 (ref 5–15)
BUN: 11 mg/dL (ref 8–23)
CO2: 28 mmol/L (ref 22–32)
Calcium: 9.2 mg/dL (ref 8.9–10.3)
Chloride: 100 mmol/L (ref 98–111)
Creatinine, Ser: 0.8 mg/dL (ref 0.44–1.00)
GFR calc Af Amer: >60 mL/min (ref >60)
GFR calc non Af Amer: >60 mL/min (ref >60)
Glucose, Bld: 103 mg/dL — ABNORMAL HIGH (ref 70–99)
Potassium: 2.8 mmol/L — ABNORMAL LOW (ref 3.5–5.1)
Sodium: 140 mmol/L (ref 135–145)
Total Bilirubin: 0.8 mg/dL (ref 0.3–1.2)
Total Protein: 7.1 g/dL (ref 6.5–8.1)

## 2019-09-19 LAB — URINALYSIS, ROUTINE W REFLEX MICROSCOPIC
Bilirubin Urine: NEGATIVE
Glucose, UA: NEGATIVE mg/dL
Hgb urine dipstick: NEGATIVE
Ketones, ur: NEGATIVE mg/dL
Leukocytes,Ua: NEGATIVE
Nitrite: NEGATIVE
Protein, ur: NEGATIVE mg/dL
Specific Gravity, Urine: 1.014 (ref 1.005–1.030)
pH: 6 (ref 5.0–8.0)

## 2019-09-19 LAB — CBC WITH DIFFERENTIAL/PLATELET
Abs Immature Granulocytes: 0.01 10*3/uL (ref 0.00–0.07)
Basophils Absolute: 0 10*3/uL (ref 0.0–0.1)
Basophils Relative: 0 %
Eosinophils Absolute: 0.1 10*3/uL (ref 0.0–0.5)
Eosinophils Relative: 1 %
HCT: 39.4 % (ref 36.0–46.0)
Hemoglobin: 13.5 g/dL (ref 12.0–15.0)
Immature Granulocytes: 0 %
Lymphocytes Relative: 27 %
Lymphs Abs: 2.1 10*3/uL (ref 0.7–4.0)
MCH: 32 pg (ref 26.0–34.0)
MCHC: 34.3 g/dL (ref 30.0–36.0)
MCV: 93.4 fL (ref 80.0–100.0)
Monocytes Absolute: 1 10*3/uL (ref 0.1–1.0)
Monocytes Relative: 13 %
Neutro Abs: 4.5 10*3/uL (ref 1.7–7.7)
Neutrophils Relative %: 59 %
Platelets: 206 10*3/uL (ref 150–400)
RBC: 4.22 MIL/uL (ref 3.87–5.11)
RDW: 13.3 % (ref 11.5–15.5)
WBC: 7.7 10*3/uL (ref 4.0–10.5)
nRBC: 0 % (ref 0.0–0.2)

## 2019-09-19 LAB — SAMPLE TO BLOOD BANK

## 2019-09-19 LAB — LIPASE, BLOOD: Lipase: 22 U/L (ref 11–51)

## 2019-09-19 LAB — SARS CORONAVIRUS 2 BY RT PCR (HOSPITAL ORDER, PERFORMED IN ~~LOC~~ HOSPITAL LAB): SARS Coronavirus 2: NEGATIVE

## 2019-09-19 LAB — PROTIME-INR
INR: 1 (ref 0.8–1.2)
Prothrombin Time: 13 seconds (ref 11.4–15.2)

## 2019-09-19 SURGERY — COLONOSCOPY WITH PROPOFOL
Anesthesia: General

## 2019-09-19 MED ORDER — FLUTICASONE PROPIONATE 50 MCG/ACT NA SUSP
1.0000 | Freq: Every day | NASAL | Status: DC | PRN
  Filled 2019-09-19: qty 16

## 2019-09-19 MED ORDER — ACETAMINOPHEN 325 MG PO TABS: 650.0000 mg | ORAL_TABLET | Freq: Four times a day (QID) | ORAL | Status: DC | PRN

## 2019-09-19 MED ORDER — SODIUM CHLORIDE 0.9 % IV SOLN
80.0000 mg | Freq: Once | INTRAVENOUS | Status: AC
  Administered 2019-09-19: 80 mg via INTRAVENOUS
  Filled 2019-09-19: qty 80

## 2019-09-19 MED ORDER — ALBUTEROL SULFATE (2.5 MG/3ML) 0.083% IN NEBU: 3.0000 mL | INHALATION_SOLUTION | RESPIRATORY_TRACT | Status: DC | PRN

## 2019-09-19 MED ORDER — RISPERIDONE 0.5 MG PO TABS
0.5000 mg | ORAL_TABLET | Freq: Every day | ORAL | Status: DC
  Administered 2019-09-19 – 2019-09-21 (×3): 0.5 mg via ORAL
  Filled 2019-09-19 (×5): qty 1

## 2019-09-19 MED ORDER — VITAMIN B-12 1000 MCG PO TABS
1000.0000 ug | ORAL_TABLET | Freq: Every day | ORAL | Status: DC
  Administered 2019-09-20 – 2019-09-22 (×3): 1000 ug via ORAL
  Filled 2019-09-19 (×3): qty 1

## 2019-09-19 MED ORDER — POTASSIUM CHLORIDE 20 MEQ/15ML (10%) PO SOLN
40.0000 meq | Freq: Once | ORAL | Status: AC
  Administered 2019-09-19: 40 meq via ORAL
  Filled 2019-09-19: qty 30

## 2019-09-19 MED ORDER — FESOTERODINE FUMARATE ER 8 MG PO TB24
8.0000 mg | ORAL_TABLET | Freq: Every day | ORAL | Status: DC
  Administered 2019-09-20 – 2019-09-22 (×3): 8 mg via ORAL
  Filled 2019-09-19 (×4): qty 1

## 2019-09-19 MED ORDER — POTASSIUM CHLORIDE 20 MEQ/15ML (10%) PO SOLN
40.0000 meq | Freq: Once | ORAL | Status: DC
  Filled 2019-09-19: qty 30

## 2019-09-19 MED ORDER — MORPHINE SULFATE (PF) 2 MG/ML IV SOLN: 1.0000 mg | INTRAVENOUS | Status: DC | PRN

## 2019-09-19 MED ORDER — RISAQUAD PO CAPS
1.0000 | ORAL_CAPSULE | Freq: Every day | ORAL | Status: DC
  Administered 2019-09-20 – 2019-09-22 (×3): 1 via ORAL
  Filled 2019-09-19 (×3): qty 1

## 2019-09-19 MED ORDER — BUPROPION HCL ER (XL) 150 MG PO TB24
300.0000 mg | ORAL_TABLET | Freq: Every day | ORAL | Status: DC
  Administered 2019-09-19 – 2019-09-22 (×4): 300 mg via ORAL
  Filled 2019-09-19 (×4): qty 2

## 2019-09-19 MED ORDER — SODIUM CHLORIDE 0.9 % IV SOLN
8.0000 mg/h | INTRAVENOUS | Status: DC
  Administered 2019-09-19 – 2019-09-21 (×2): 8 mg/h via INTRAVENOUS
  Filled 2019-09-19 (×3): qty 80

## 2019-09-19 MED ORDER — DONEPEZIL HCL 5 MG PO TABS
5.0000 mg | ORAL_TABLET | Freq: Every day | ORAL | Status: DC
  Administered 2019-09-19 – 2019-09-21 (×3): 5 mg via ORAL
  Filled 2019-09-19 (×5): qty 1

## 2019-09-19 MED ORDER — ASCORBIC ACID 500 MG PO TABS
1000.0000 mg | ORAL_TABLET | Freq: Every day | ORAL | Status: DC
  Administered 2019-09-19 – 2019-09-22 (×4): 1000 mg via ORAL
  Filled 2019-09-19 (×4): qty 2

## 2019-09-19 MED ORDER — SODIUM CHLORIDE 0.9 % IV SOLN: INTRAVENOUS | Status: DC

## 2019-09-19 MED ORDER — LEVOCETIRIZINE DIHYDROCHLORIDE 5 MG PO TABS: 5.0000 mg | ORAL_TABLET | Freq: Every evening | ORAL | Status: DC

## 2019-09-19 MED ORDER — CALCIUM CARBONATE 1250 (500 CA) MG PO TABS
1.0000 | ORAL_TABLET | Freq: Every day | ORAL | Status: DC
  Filled 2019-09-19: qty 1

## 2019-09-19 MED ORDER — METOPROLOL SUCCINATE ER 50 MG PO TB24
50.0000 mg | ORAL_TABLET | Freq: Every day | ORAL | Status: DC
  Administered 2019-09-19 – 2019-09-22 (×3): 50 mg via ORAL
  Filled 2019-09-19 (×4): qty 1

## 2019-09-19 MED ORDER — LOSARTAN POTASSIUM 50 MG PO TABS
50.0000 mg | ORAL_TABLET | Freq: Every day | ORAL | Status: DC
  Administered 2019-09-20 – 2019-09-21 (×3): 50 mg via ORAL
  Filled 2019-09-19 (×4): qty 1

## 2019-09-19 MED ORDER — ONDANSETRON HCL 4 MG/2ML IJ SOLN
4.0000 mg | Freq: Three times a day (TID) | INTRAMUSCULAR | Status: DC | PRN
  Administered 2019-09-20 – 2019-09-21 (×2): 4 mg via INTRAVENOUS
  Filled 2019-09-19 (×2): qty 2

## 2019-09-19 MED ORDER — LEFLUNOMIDE 20 MG PO TABS
20.0000 mg | ORAL_TABLET | Freq: Every day | ORAL | Status: DC
  Administered 2019-09-20 – 2019-09-22 (×3): 20 mg via ORAL
  Filled 2019-09-19 (×4): qty 1

## 2019-09-19 MED ORDER — PREDNISONE 5 MG PO TABS
5.0000 mg | ORAL_TABLET | Freq: Every day | ORAL | Status: DC
  Administered 2019-09-19 – 2019-09-22 (×4): 5 mg via ORAL
  Filled 2019-09-19 (×5): qty 1

## 2019-09-19 MED ORDER — MAGNESIUM SULFATE 2 GM/50ML IV SOLN
2.0000 g | Freq: Once | INTRAVENOUS | Status: AC
  Administered 2019-09-19: 2 g via INTRAVENOUS
  Filled 2019-09-19: qty 50

## 2019-09-19 MED ORDER — DM-GUAIFENESIN ER 30-600 MG PO TB12: 1.0000 | ORAL_TABLET | Freq: Two times a day (BID) | ORAL | Status: DC | PRN

## 2019-09-19 MED ORDER — SODIUM CHLORIDE 0.9 % IV BOLUS
1000.0000 mL | Freq: Once | INTRAVENOUS | Status: AC
  Administered 2019-09-19: 1000 mL via INTRAVENOUS

## 2019-09-19 MED ORDER — DESVENLAFAXINE SUCCINATE ER 50 MG PO TB24
50.0000 mg | ORAL_TABLET | Freq: Every day | ORAL | Status: DC
  Administered 2019-09-20 – 2019-09-22 (×3): 50 mg via ORAL
  Filled 2019-09-19 (×5): qty 1

## 2019-09-19 MED ORDER — HYDRALAZINE HCL 20 MG/ML IJ SOLN
5.0000 mg | INTRAMUSCULAR | Status: DC | PRN
  Administered 2019-09-21: 5 mg via INTRAVENOUS
  Filled 2019-09-19: qty 1

## 2019-09-19 NOTE — ED Notes (Signed)
Pt assisted back to bed and new brief placed. Pt repositioned on the bed. Blanket given and pillow

## 2019-09-19 NOTE — ED Triage Notes (Addendum)
Pt arrived via [facility] from home with complaint of sudden Ab pain with black tarry stools. No N/V reported. Pt states she was prepping for her colonoscopy when this started early this AM.   VSS. Pt arrived alert and oriented. No active vomiting or bleeding noted.
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
## 2019-09-19 NOTE — ED Notes (Signed)
Pt ambulated to toilet with steady gait, remains denying lightheaded or dizziness. Medicated per MAR. Blankets nad ice chips provided, lights dimmed for comfort, pt watching TV. AO x4
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
## 2019-09-19 NOTE — ED Notes (Signed)
Called pharmacy to send up medication

## 2019-09-19 NOTE — ED Notes (Signed)
Called Pharmacy again to check on protonix. Per Pharmacy they are having to mix some up and will send up asap.

## 2019-09-19 NOTE — ED Notes (Signed)
Assumed care of pt at 1900, pt ambulated to toilet independently, denies lightheaded or dizziness. AO x4. Daughter was at bedside and will return in the AM. Pt states diarrhea has stopped and she has not seen black stools the past two trips to the bathroom.

## 2019-09-19 NOTE — ED Notes (Signed)
Pt up to toilet 

## 2019-09-20 DIAGNOSIS — K219 Gastro-esophageal reflux disease without esophagitis: Secondary | ICD-10-CM | POA: Diagnosis not present

## 2019-09-20 DIAGNOSIS — R197 Diarrhea, unspecified: Secondary | ICD-10-CM

## 2019-09-20 DIAGNOSIS — Z885 Allergy status to narcotic agent status: Secondary | ICD-10-CM | POA: Diagnosis not present

## 2019-09-20 DIAGNOSIS — Z8371 Family history of colonic polyps: Secondary | ICD-10-CM | POA: Diagnosis not present

## 2019-09-20 DIAGNOSIS — F329 Major depressive disorder, single episode, unspecified: Secondary | ICD-10-CM | POA: Diagnosis present

## 2019-09-20 DIAGNOSIS — Z87891 Personal history of nicotine dependence: Secondary | ICD-10-CM | POA: Diagnosis not present

## 2019-09-20 DIAGNOSIS — K922 Gastrointestinal hemorrhage, unspecified: Secondary | ICD-10-CM

## 2019-09-20 DIAGNOSIS — Z7983 Long term (current) use of bisphosphonates: Secondary | ICD-10-CM | POA: Diagnosis not present

## 2019-09-20 DIAGNOSIS — K529 Noninfective gastroenteritis and colitis, unspecified: Secondary | ICD-10-CM | POA: Diagnosis present

## 2019-09-20 DIAGNOSIS — M1710 Unilateral primary osteoarthritis, unspecified knee: Secondary | ICD-10-CM | POA: Diagnosis present

## 2019-09-20 DIAGNOSIS — R339 Retention of urine, unspecified: Secondary | ICD-10-CM | POA: Diagnosis not present

## 2019-09-20 DIAGNOSIS — K5731 Diverticulosis of large intestine without perforation or abscess with bleeding: Secondary | ICD-10-CM | POA: Diagnosis present

## 2019-09-20 DIAGNOSIS — Z820 Family history of epilepsy and other diseases of the nervous system: Secondary | ICD-10-CM | POA: Diagnosis not present

## 2019-09-20 DIAGNOSIS — K8020 Calculus of gallbladder without cholecystitis without obstruction: Secondary | ICD-10-CM | POA: Diagnosis present

## 2019-09-20 DIAGNOSIS — M0510 Rheumatoid lung disease with rheumatoid arthritis of unspecified site: Secondary | ICD-10-CM | POA: Diagnosis present

## 2019-09-20 DIAGNOSIS — Z20822 Contact with and (suspected) exposure to covid-19: Secondary | ICD-10-CM | POA: Diagnosis present

## 2019-09-20 DIAGNOSIS — Z7951 Long term (current) use of inhaled steroids: Secondary | ICD-10-CM | POA: Diagnosis not present

## 2019-09-20 DIAGNOSIS — E876 Hypokalemia: Secondary | ICD-10-CM | POA: Diagnosis present

## 2019-09-20 DIAGNOSIS — Z8249 Family history of ischemic heart disease and other diseases of the circulatory system: Secondary | ICD-10-CM | POA: Diagnosis not present

## 2019-09-20 DIAGNOSIS — I10 Essential (primary) hypertension: Secondary | ICD-10-CM

## 2019-09-20 DIAGNOSIS — E559 Vitamin D deficiency, unspecified: Secondary | ICD-10-CM | POA: Diagnosis present

## 2019-09-20 DIAGNOSIS — J8410 Pulmonary fibrosis, unspecified: Secondary | ICD-10-CM | POA: Diagnosis present

## 2019-09-20 DIAGNOSIS — Z91010 Allergy to peanuts: Secondary | ICD-10-CM | POA: Diagnosis not present

## 2019-09-20 DIAGNOSIS — Z888 Allergy status to other drugs, medicaments and biological substances status: Secondary | ICD-10-CM | POA: Diagnosis not present

## 2019-09-20 DIAGNOSIS — Z79899 Other long term (current) drug therapy: Secondary | ICD-10-CM | POA: Diagnosis not present

## 2019-09-20 DIAGNOSIS — K449 Diaphragmatic hernia without obstruction or gangrene: Secondary | ICD-10-CM | POA: Diagnosis present

## 2019-09-20 DIAGNOSIS — M0609 Rheumatoid arthritis without rheumatoid factor, multiple sites: Secondary | ICD-10-CM | POA: Diagnosis present

## 2019-09-20 LAB — CBC
HCT: 32.9 % — ABNORMAL LOW (ref 36.0–46.0)
HCT: 36.6 % (ref 36.0–46.0)
HCT: 37.4 % (ref 36.0–46.0)
Hemoglobin: 11.4 g/dL — ABNORMAL LOW (ref 12.0–15.0)
Hemoglobin: 12.4 g/dL (ref 12.0–15.0)
Hemoglobin: 12.3 g/dL (ref 12.0–15.0)
MCH: 32.2 pg (ref 26.0–34.0)
MCH: 31.6 pg (ref 26.0–34.0)
MCH: 31.3 pg (ref 26.0–34.0)
MCHC: 34.7 g/dL (ref 30.0–36.0)
MCHC: 33.9 g/dL (ref 30.0–36.0)
MCHC: 32.9 g/dL (ref 30.0–36.0)
MCV: 92.9 fL (ref 80.0–100.0)
MCV: 93.1 fL (ref 80.0–100.0)
MCV: 95.2 fL (ref 80.0–100.0)
Platelets: 168 10*3/uL (ref 150–400)
Platelets: 184 10*3/uL (ref 150–400)
Platelets: 193 10*3/uL (ref 150–400)
RBC: 3.54 MIL/uL — ABNORMAL LOW (ref 3.87–5.11)
RBC: 3.93 MIL/uL (ref 3.87–5.11)
RBC: 3.93 MIL/uL (ref 3.87–5.11)
RDW: 13.5 % (ref 11.5–15.5)
RDW: 13.3 % (ref 11.5–15.5)
RDW: 13.2 % (ref 11.5–15.5)
WBC: 5.7 10*3/uL (ref 4.0–10.5)
WBC: 5.4 10*3/uL (ref 4.0–10.5)
WBC: 5.5 10*3/uL (ref 4.0–10.5)
nRBC: 0 % (ref 0.0–0.2)
nRBC: 0 % (ref 0.0–0.2)
nRBC: 0 % (ref 0.0–0.2)

## 2019-09-20 LAB — BASIC METABOLIC PANEL
Anion gap: 8 (ref 5–15)
BUN: 8 mg/dL (ref 8–23)
CO2: 24 mmol/L (ref 22–32)
Calcium: 7.6 mg/dL — ABNORMAL LOW (ref 8.9–10.3)
Chloride: 107 mmol/L (ref 98–111)
Creatinine, Ser: 0.61 mg/dL (ref 0.44–1.00)
GFR calc Af Amer: >60 mL/min (ref >60)
GFR calc non Af Amer: >60 mL/min (ref >60)
Glucose, Bld: 94 mg/dL (ref 70–99)
Potassium: 3.2 mmol/L — ABNORMAL LOW (ref 3.5–5.1)
Sodium: 139 mmol/L (ref 135–145)

## 2019-09-20 LAB — MAGNESIUM: Magnesium: 2.3 mg/dL (ref 1.7–2.4)

## 2019-09-20 LAB — APTT: aPTT: 25 seconds (ref 24–36)

## 2019-09-20 LAB — GLUCOSE, CAPILLARY: Glucose-Capillary: 92 mg/dL (ref 70–99)

## 2019-09-20 MED ORDER — PEG 3350-KCL-NA BICARB-NACL 420 G PO SOLR
4000.0000 mL | Freq: Once | ORAL | Status: AC
  Administered 2019-09-20: 4000 mL via ORAL
  Filled 2019-09-20 (×2): qty 4000

## 2019-09-20 MED ORDER — POTASSIUM CHLORIDE 10 MEQ/100ML IV SOLN
10.0000 meq | INTRAVENOUS | Status: AC
  Administered 2019-09-20 (×2): 10 meq via INTRAVENOUS
  Filled 2019-09-20 (×2): qty 100

## 2019-09-20 MED ORDER — SODIUM CHLORIDE 0.9 % IV SOLN: INTRAVENOUS | Status: DC

## 2019-09-20 MED ORDER — CALCIUM CARBONATE 1250 (500 CA) MG PO TABS
1.0000 | ORAL_TABLET | Freq: Every day | ORAL | Status: DC
  Administered 2019-09-20 – 2019-09-21 (×2): 500 mg via ORAL
  Filled 2019-09-20 (×3): qty 1

## 2019-09-20 MED ORDER — CETIRIZINE HCL 10 MG PO TABS
5.0000 mg | ORAL_TABLET | Freq: Every day | ORAL | Status: DC
  Administered 2019-09-20 – 2019-09-21 (×2): 5 mg via ORAL
  Filled 2019-09-20 (×4): qty 1

## 2019-09-20 NOTE — Progress Notes (Signed)
PROGRESS NOTE    Stephanie Patel  RXV:[id number] DOB: 1945/07/31 DOA: 09/19/2019 PCP: Stephanie Crouch, MD    Brief Narrative:  Stephanie Patel is a 74 y.o. female with medical history significant of hypertension, GERD, depression, rheumatoid arthritis, interstitial lung disease/pulmonary fibrosis, C. difficile colitis, IBS, who presents with abdominal pain, dark stool    Consultants:   GI  Procedures:   Antimicrobials:       Subjective: Stephanie Patel at bedside.  Patient asking about plans today.  Complaining of nausea, no vomiting but has not noticed any further bloody stool.  Denies shortness of breath or chest pain or abdominal pain.  Objective: Vitals:   09/20/19 0347 09/20/19 0430 09/20/19 0500 09/20/19 0600  BP: (!) 143/107 (!) 148/85 (!) 179/88 (!) 188/86  Pulse: 66 (!) 57 (!) 55 (!) 51  Resp: 19 15 16 18   Temp:      TempSrc:      SpO2: 98% 94% 96% 95%  Weight:      Height:       No intake or output data in the 24 hours ending 09/20/19 1149 Filed Weights   09/19/19 0944  Weight: 56.7 kg    Examination:  General exam: Appears calm and comfortable  Respiratory system: Clear to auscultation. Respiratory effort normal. Cardiovascular system: S1 & S2 heard, RRR. No JVD, murmurs, rubs, gallops or clicks. Gastrointestinal system: Abdomen is nondistended, soft and nontender. No organomegaly or masses felt. Normal bowel sounds heard. Central nervous system: Alert and oriented. No focal neurological deficits. Extremities: No edema Skin: Warm dry Psychiatry: Judgement and insight appear normal. Mood & affect appropriate.     Data Reviewed: I have personally reviewed following labs and imaging studies  CBC: Recent Labs  Lab 09/19/19 0944 09/19/19 1450 09/20/19 0235  WBC 7.7 7.1 5.7  NEUTROABS 4.5  --   --   HGB 13.5 13.2 11.4*  HCT 39.4 39.0 32.9*  MCV 93.4 93.3 92.9  PLT 206 220 509   Basic Metabolic Panel: Recent Labs  Lab 09/19/19 0944  09/20/19 0457  NA 140 139  K 2.8* 3.2*  CL 100 107  CO2 28 24  GLUCOSE 103* 94  BUN 11 8  CREATININE 0.80 0.61  CALCIUM 9.2 7.6*  MG  --  2.3   GFR: Estimated Creatinine Clearance: 48 mL/min (by C-G formula based on SCr of 0.61 mg/dL). Liver Function Tests: Recent Labs  Lab 09/19/19 0944  AST 23  ALT 14  ALKPHOS 58  BILITOT 0.8  PROT 7.1  ALBUMIN 3.8   Recent Labs  Lab 09/19/19 0944  LIPASE 22   No results for input(s): AMMONIA in the last 168 hours. Coagulation Profile: Recent Labs  Lab 09/19/19 0944  INR 1.0   Cardiac Enzymes: No results for input(s): CKTOTAL, CKMB, CKMBINDEX, TROPONINI in the last 168 hours. BNP (last 3 results) No results for input(s): PROBNP in the last 8760 hours. HbA1C: No results for input(s): HGBA1C in the last 72 hours. CBG: Recent Labs  Lab 09/20/19 0828  GLUCAP 92   Lipid Profile: No results for input(s): CHOL, HDL, LDLCALC, TRIG, CHOLHDL, LDLDIRECT in the last 72 hours. Thyroid Function Tests: No results for input(s): TSH, T4TOTAL, FREET4, T3FREE, THYROIDAB in the last 72 hours. Anemia Panel: No results for input(s): VITAMINB12, FOLATE, FERRITIN, TIBC, IRON, RETICCTPCT in the last 72 hours. Sepsis Labs: No results for input(s): PROCALCITON, LATICACIDVEN in the last 168 hours.  Recent Results (from the past 240 hour(s))  SARS CORONAVIRUS  2 (TAT 6-24 HRS) Nasopharyngeal Nasopharyngeal Swab     Status: None   Collection Time: 09/15/19  4:26 PM   Specimen: Nasopharyngeal Swab  Result Value Ref Range Status   SARS Coronavirus 2 NEGATIVE NEGATIVE Final    Comment: (NOTE) SARS-CoV-2 target nucleic acids are NOT DETECTED.  The SARS-CoV-2 RNA is generally detectable in upper and lower respiratory specimens during the acute phase of infection. Negative results do not preclude SARS-CoV-2 infection, do not rule out co-infections with other pathogens, and should not be used as the sole basis for treatment or other patient  management decisions. Negative results must be combined with clinical observations, patient history, and epidemiological information. The expected result is Negative.  Fact Sheet for Patients: SugarRoll.be  Fact Sheet for Healthcare Providers: https://www.woods-mathews.com/  This test is not yet approved or cleared by the [place] FDA and  has been authorized for detection and/or diagnosis of SARS-CoV-2 by FDA under an Emergency Use Authorization (EUA). This EUA will remain  in effect (meaning this test can be used) for the duration of the COVID-19 declaration under Se ction 564(b)(1) of the Act, 21 U.S.C. section 360bbb-3(b)(1), unless the authorization is terminated or revoked sooner.  Performed at [facility], [place] [address], [place], [place] [address]   SARS Coronavirus 2 by RT PCR (hospital order, performed in [facility] hospital lab) Nasopharyngeal Nasopharyngeal Swab     Status: None   Collection Time: 09/19/19  2:58 PM   Specimen: Nasopharyngeal Swab  Result Value Ref Range Status   SARS Coronavirus 2 NEGATIVE NEGATIVE Final    Comment: (NOTE) SARS-CoV-2 target nucleic acids are NOT DETECTED.  The SARS-CoV-2 RNA is generally detectable in upper and lower respiratory specimens during the acute phase of infection. The lowest concentration of SARS-CoV-2 viral copies this assay can detect is 250 copies / mL. A negative result does not preclude SARS-CoV-2 infection and should not be used as the sole basis for treatment or other patient management decisions.  A negative result may occur with improper specimen collection / handling, submission of specimen other than nasopharyngeal swab, presence of viral mutation(s) within the areas targeted by this assay, and inadequate number of viral copies (<250 copies / mL). A negative result must be combined with clinical observations, patient history, and epidemiological  information.  Fact Sheet for Patients:   StrictlyIdeas.no  Fact Sheet for Healthcare Providers: BankingDealers.co.za  This test is not yet approved or  cleared by the [place] FDA and has been authorized for detection and/or diagnosis of SARS-CoV-2 by FDA under an Emergency Use Authorization (EUA).  This EUA will remain in effect (meaning this test can be used) for the duration of the COVID-19 declaration under Section 564(b)(1) of the Act, 21 U.S.C. section 360bbb-3(b)(1), unless the authorization is terminated or revoked sooner.  Performed at [facility], [address] [place], [place] [address]          Radiology Studies: No results found.      Scheduled Meds: . acidophilus  1 capsule Oral Daily  . vitamin C  1,000 mg Oral Daily  . buPROPion  300 mg Oral Daily  . calcium carbonate  1 tablet Oral QHS  . cetirizine  5 mg Oral QHS  . desvenlafaxine  50 mg Oral Daily  . donepezil  5 mg Oral QHS  . fesoterodine  8 mg Oral Daily  . leflunomide  20 mg Oral Daily  . losartan  50 mg Oral Daily  .  metoprolol succinate  50 mg Oral Daily  . potassium chloride  40 mEq Oral Once  . predniSONE  5 mg Oral Daily  . risperiDONE  0.5 mg Oral QHS  . cyanocobalamin  1,000 mcg Oral Daily   Continuous Infusions: . sodium chloride 100 mL/hr at 09/20/19 0515  . pantoprozole (PROTONIX) infusion 8 mg/hr (09/19/19 2058)    Assessment & Plan:   Principal Problem:   GIB (gastrointestinal bleeding) Active Problems:   Depression   HTN, goal below 140/80   ILD (interstitial lung disease) (HCC)   Rheumatoid arthritis of multiple sites with negative rheumatoid factor (HCC)   GERD (gastroesophageal reflux disease)   Hypokalemia   GIB (gastrointestinal bleeding):  GI consult pending Continue IV fluids N.p.o. in case plan for scope today Continue IV PPI Avoid NSAIDs and subcu heparin Monitor H&H closely every 6,  transfuse if hemoglobin less than 7   Depression -BuSpar, Pristiq    HTN, goal below 140/80 On losartan and IV hydralazine Increase losartan as needed  ILD (interstitial lung disease) ([place]):  Stable without any acute exacerbation Continue bronchodilators  Rheumatoid arthritis of multiple sites with negative rheumatoid factor ([place]):  Patient receiving Humira.   Continue leflunomide    GERD (gastroesophageal reflux disease) Continue PPI drip, see #1  Hypokalemia: K= 2.8  on admission. Given magnesium, magnesium 2.3 K today 3.2, will supplement via IV Monitor levels      DVT prophylaxis: SCD Code Status: Partial, okay for CPR but no intubation Family Communication: Stephanie Patel at bedside updated  Status is: Observation  The patient remains OBS appropriate and will d/c before 2 midnights.  Dispo: The patient is from: Home              Anticipated d/c is to: Home              Anticipated d/c date is: 1 day              Patient currently is not medically stable to d/c.  GI work-up pending             LOS: 0 days   Time spent: 35 minutes with more than 50% on [place], MD [facility] Pager [phone]  If 7PM-7AM, please contact night-coverage [email or web address] Password [facility] 09/20/2019, 11:49 AM

## 2019-09-20 NOTE — ED Notes (Signed)
Pt has bedside commode and is able to ambulate independently to restroom. Pt has had x2 BM while in this RN care.
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
## 2019-09-21 ENCOUNTER — Encounter
Admission: EM | Disposition: A | Discharge status: Home Health | Payer: Self-pay | Source: Home / Self Care | Attending: Internal Medicine

## 2019-09-21 ENCOUNTER — Inpatient Hospital Stay: Payer: Medicare Other | Admitting: Anesthesiology

## 2019-09-21 ENCOUNTER — Encounter: Payer: Self-pay | Admitting: Internal Medicine

## 2019-09-21 HISTORY — PX: COLONOSCOPY WITH PROPOFOL: SHX5780

## 2019-09-21 HISTORY — PX: ESOPHAGOGASTRODUODENOSCOPY (EGD) WITH PROPOFOL: SHX5813

## 2019-09-21 LAB — CBC
HCT: 36.3 % (ref 36.0–46.0)
HCT: 33.8 % — ABNORMAL LOW (ref 36.0–46.0)
Hemoglobin: 12.5 g/dL (ref 12.0–15.0)
Hemoglobin: 11.3 g/dL — ABNORMAL LOW (ref 12.0–15.0)
MCH: 31.8 pg (ref 26.0–34.0)
MCH: 32 pg (ref 26.0–34.0)
MCHC: 34.4 g/dL (ref 30.0–36.0)
MCHC: 33.4 g/dL (ref 30.0–36.0)
MCV: 92.4 fL (ref 80.0–100.0)
MCV: 95.8 fL (ref 80.0–100.0)
Platelets: 183 10*3/uL (ref 150–400)
Platelets: 161 10*3/uL (ref 150–400)
RBC: 3.93 MIL/uL (ref 3.87–5.11)
RBC: 3.53 MIL/uL — ABNORMAL LOW (ref 3.87–5.11)
RDW: 13.1 % (ref 11.5–15.5)
RDW: 13.3 % (ref 11.5–15.5)
WBC: 5.1 10*3/uL (ref 4.0–10.5)
WBC: 5.2 10*3/uL (ref 4.0–10.5)
nRBC: 0 % (ref 0.0–0.2)
nRBC: 0 % (ref 0.0–0.2)

## 2019-09-21 LAB — GLUCOSE, CAPILLARY: Glucose-Capillary: 83 mg/dL (ref 70–99)

## 2019-09-21 LAB — POTASSIUM: Potassium: 3.3 mmol/L — ABNORMAL LOW (ref 3.5–5.1)

## 2019-09-21 SURGERY — ESOPHAGOGASTRODUODENOSCOPY (EGD) WITH PROPOFOL
Anesthesia: General

## 2019-09-21 MED ORDER — SODIUM CHLORIDE 0.9 % IV SOLN: INTRAVENOUS | Status: DC

## 2019-09-21 MED ORDER — POTASSIUM CHLORIDE 20 MEQ PO PACK
40.0000 meq | PACK | Freq: Once | ORAL | Status: AC
  Administered 2019-09-21: 40 meq via ORAL
  Filled 2019-09-21: qty 2

## 2019-09-21 MED ORDER — PROPOFOL 500 MG/50ML IV EMUL
INTRAVENOUS | Status: DC | PRN
  Administered 2019-09-21: 120 ug/kg/min via INTRAVENOUS

## 2019-09-21 MED ORDER — PANTOPRAZOLE SODIUM 40 MG PO TBEC
40.0000 mg | DELAYED_RELEASE_TABLET | Freq: Every day | ORAL | Status: DC
  Administered 2019-09-21 – 2019-09-22 (×2): 40 mg via ORAL
  Filled 2019-09-21 (×2): qty 1

## 2019-09-21 MED ORDER — LIDOCAINE HCL (CARDIAC) PF 100 MG/5ML IV SOSY
PREFILLED_SYRINGE | INTRAVENOUS | Status: DC | PRN
  Administered 2019-09-21: 50 mg via INTRAVENOUS

## 2019-09-21 MED ORDER — GLYCOPYRROLATE 0.2 MG/ML IJ SOLN
INTRAMUSCULAR | Status: DC | PRN
  Administered 2019-09-21: .2 mg via INTRAVENOUS

## 2019-09-21 MED ORDER — LOSARTAN POTASSIUM 50 MG PO TABS
50.0000 mg | ORAL_TABLET | Freq: Two times a day (BID) | ORAL | Status: DC
  Administered 2019-09-21 – 2019-09-22 (×2): 50 mg via ORAL
  Filled 2019-09-21 (×2): qty 1

## 2019-09-21 NOTE — Progress Notes (Signed)
PROGRESS NOTE    Stephanie Patel  TIR:[id number] DOB: 1945/12/14 DOA: 09/19/2019 PCP: Idelle Crouch, MD    Brief Narrative:  Stephanie Patel a 74 y.o.femalewith medical history significant ofhypertension, GERD, depression, rheumatoid arthritis, interstitial lung disease/pulmonary fibrosis, C. difficile colitis, IBS, who presents with abdominal pain, dark stool    Consultants:   GI  Procedures: EGD/Cscope 9/17  Antimicrobials:      Subjective: Pt post scope unable to urinate, bladder scan 450. Told nsg to straight cath. This am she had no issues or complaints , no further bleed.  Objective: Vitals:   09/21/19 1313 09/21/19 1315 09/21/19 1347 09/21/19 1355  BP: (!) 157/106 (!) 183/97 (!) 195/68 (!) (P) 164/98  Pulse: 69 70 67   Resp: 16  16   Temp:      TempSrc:      SpO2: 99%     Weight:      Height:        Intake/Output Summary (Last 24 hours) at 09/21/2019 1637 Last data filed at 09/21/2019 1200 Gross per 24 hour  Intake 3514.97 ml  Output 300 ml  Net 3214.97 ml   Filed Weights   09/19/19 0944  Weight: 56.7 kg    Examination:  General exam: Appears calm and comfortable  Respiratory system: Clear to auscultation. Respiratory effort normal. Cardiovascular system: S1 & S2 heard, RRR. No JVD, murmurs, rubs, gallops or clicks.  Gastrointestinal system: Abdomen is nondistended, soft and nontender. No organomegaly or masses felt. Normal bowel sounds heard. Central nervous system: Alert and oriented. No focal neurological deficits. Extremities: no edema Skin: No rashes, lesions or ulcers Psychiatry: Judgement and insight appear normal. Mood & affect appropriate.     Data Reviewed: I have personally reviewed following labs and imaging studies  CBC: Recent Labs  Lab 09/19/19 0944 09/19/19 1450 09/20/19 0235 09/20/19 1252 09/20/19 2031 09/21/19 0405 09/21/19 0836  WBC 7.7   < > 5.7 5.4 5.5 5.1 5.2  NEUTROABS 4.5  --   --   --   --   --   --     HGB 13.5   < > 11.4* 12.4 12.3 12.5 11.3*  HCT 39.4   < > 32.9* 36.6 37.4 36.3 33.8*  MCV 93.4   < > 92.9 93.1 95.2 92.4 95.8  PLT 206   < > 168 184 193 183 161   < > = values in this interval not displayed.   Basic Metabolic Panel: Recent Labs  Lab 09/19/19 0944 09/20/19 0457 09/21/19 0405  NA 140 139  --   K 2.8* 3.2* 3.3*  CL 100 107  --   CO2 28 24  --   GLUCOSE 103* 94  --   BUN 11 8  --   CREATININE 0.80 0.61  --   CALCIUM 9.2 7.6*  --   MG  --  2.3  --    GFR: Estimated Creatinine Clearance: 48 mL/min (by C-G formula based on SCr of 0.61 mg/dL). Liver Function Tests: Recent Labs  Lab 09/19/19 0944  AST 23  ALT 14  ALKPHOS 58  BILITOT 0.8  PROT 7.1  ALBUMIN 3.8   Recent Labs  Lab 09/19/19 0944  LIPASE 22   No results for input(s): AMMONIA in the last 168 hours. Coagulation Profile: Recent Labs  Lab 09/19/19 0944  INR 1.0   Cardiac Enzymes: No results for input(s): CKTOTAL, CKMB, CKMBINDEX, TROPONINI in the last 168 hours. BNP (last 3 results) No results for  input(s): PROBNP in the last 8760 hours. HbA1C: No results for input(s): HGBA1C in the last 72 hours. CBG: Recent Labs  Lab 09/20/19 0828 09/21/19 0745  GLUCAP 92 83   Lipid Profile: No results for input(s): CHOL, HDL, LDLCALC, TRIG, CHOLHDL, LDLDIRECT in the last 72 hours. Thyroid Function Tests: No results for input(s): TSH, T4TOTAL, FREET4, T3FREE, THYROIDAB in the last 72 hours. Anemia Panel: No results for input(s): VITAMINB12, FOLATE, FERRITIN, TIBC, IRON, RETICCTPCT in the last 72 hours. Sepsis Labs: No results for input(s): PROCALCITON, LATICACIDVEN in the last 168 hours.  Recent Results (from the past 240 hour(s))  SARS CORONAVIRUS 2 (TAT 6-24 HRS) Nasopharyngeal Nasopharyngeal Swab     Status: None   Collection Time: 09/15/19  4:26 PM   Specimen: Nasopharyngeal Swab  Result Value Ref Range Status   SARS Coronavirus 2 NEGATIVE NEGATIVE Final    Comment: (NOTE) SARS-CoV-2  target nucleic acids are NOT DETECTED.  The SARS-CoV-2 RNA is generally detectable in upper and lower respiratory specimens during the acute phase of infection. Negative results do not preclude SARS-CoV-2 infection, do not rule out co-infections with other pathogens, and should not be used as the sole basis for treatment or other patient management decisions. Negative results must be combined with clinical observations, patient history, and epidemiological information. The expected result is Negative.  Fact Sheet for Patients: SugarRoll.be  Fact Sheet for Healthcare Providers: https://www.woods-mathews.com/  This test is not yet approved or cleared by the [place] FDA and  has been authorized for detection and/or diagnosis of SARS-CoV-2 by FDA under an Emergency Use Authorization (EUA). This EUA will remain  in effect (meaning this test can be used) for the duration of the COVID-19 declaration under Se ction 564(b)(1) of the Act, 21 U.S.C. section 360bbb-3(b)(1), unless the authorization is terminated or revoked sooner.  Performed at [facility], [place] [address] [place], [place] [address]   SARS Coronavirus 2 by RT PCR (hospital order, performed in [facility] hospital lab) Nasopharyngeal Nasopharyngeal Swab     Status: None   Collection Time: 09/19/19  2:58 PM   Specimen: Nasopharyngeal Swab  Result Value Ref Range Status   SARS Coronavirus 2 NEGATIVE NEGATIVE Final    Comment: (NOTE) SARS-CoV-2 target nucleic acids are NOT DETECTED.  The SARS-CoV-2 RNA is generally detectable in upper and lower respiratory specimens during the acute phase of infection. The lowest concentration of SARS-CoV-2 viral copies this assay can detect is 250 copies / mL. A negative result does not preclude SARS-CoV-2 infection and should not be used as the sole basis for treatment or other patient management decisions.  A negative result may occur  with improper specimen collection / handling, submission of specimen other than nasopharyngeal swab, presence of viral mutation(s) within the areas targeted by this assay, and inadequate number of viral copies (<250 copies / mL). A negative result must be combined with clinical observations, patient history, and epidemiological information.  Fact Sheet for Patients:   StrictlyIdeas.no  Fact Sheet for Healthcare Providers: BankingDealers.co.za  This test is not yet approved or  cleared by the [place] FDA and has been authorized for detection and/or diagnosis of SARS-CoV-2 by FDA under an Emergency Use Authorization (EUA).  This EUA will remain in effect (meaning this test can be used) for the duration of the COVID-19 declaration under Section 564(b)(1) of the Act, 21 U.S.C. section 360bbb-3(b)(1), unless the authorization is terminated or revoked sooner.  Performed at [facility], [place]  [address] [place], [place] [address]          Radiology Studies: No results found.      Scheduled Meds:  acidophilus  1 capsule Oral Daily   vitamin C  1,000 mg Oral Daily   buPROPion  300 mg Oral Daily   calcium carbonate  1 tablet Oral QPC supper   cetirizine  5 mg Oral QHS   desvenlafaxine  50 mg Oral Daily   donepezil  5 mg Oral QHS   fesoterodine  8 mg Oral Daily   leflunomide  20 mg Oral Daily   losartan  50 mg Oral Daily   metoprolol succinate  50 mg Oral Daily   potassium chloride  40 mEq Oral Once   predniSONE  5 mg Oral Daily   risperiDONE  0.5 mg Oral QHS   cyanocobalamin  1,000 mcg Oral Daily   Continuous Infusions:  sodium chloride 100 mL/hr at 09/21/19 1519   sodium chloride Stopped (09/20/19 1357)   pantoprozole (PROTONIX) infusion 8 mg/hr (09/21/19 1520)    Assessment & Plan:   Principal Problem:   GIB (gastrointestinal bleeding) Active Problems:   Depression   HTN, goal  below 140/80   ILD (interstitial lung disease) (HCC)   Rheumatoid arthritis of multiple sites with negative rheumatoid factor (HCC)   GERD (gastroesophageal reflux disease)   Hypokalemia   GIB (gastrointestinal bleeding):  GI consulted, status post EGD and colonoscopy today.  Found with diverticulosis. Avoid NSAID and subcu heparin for now H&H stable   Urinary Retention: unable to urinate now. Bladder scan with 450 cc.  Patient is quite nervous about this.  Likely from anesthesia medication.  Will straight cath if bladder scan 250 or more Encouraged her to ambulate If continues having urinary retention will have to DC with Foley    Depression Continue with BuSpar and Pristiq     HTN, goal below 140/80 Increase losartan as neede increase losartan to 50 mg twice daily as BP is elevated Continue IV hydralazine as needed  ILD (interstitial lung disease) ([place]): Stable without any acute exacerbation Continue bronchodilators  Rheumatoid arthritis of multiple sites with negative rheumatoid factor ([place]):  Patient on Humira as outpatient  Continue leflunomide     GERD (gastroesophageal reflux disease) Continue PPI IV switch to p.o.  Hypokalemia: Today 3.3, will supplement  Ck am labs       DVT prophylaxis: SCD Code Status:Partial, okay for CPR but no intubation Family Communication: Daughter at bedside  Status is: Inpatient  Remains inpatient appropriate because:Ongoing diagnostic testing needed not appropriate for outpatient work up   Dispo: The patient is from: Home              Anticipated d/c is to: Home              Anticipated d/c date is: 1 day              Patient currently is not medically stable to d/c.            LOS: 1 day   Time spent: 35 min with >50% on coc    Nolberto Hanlon, MD [facility] Pager [phone]  If 7PM-7AM, please contact night-coverage [email or web address] Password [facility] 09/21/2019, 4:37 PM
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
## 2019-09-21 NOTE — Anesthesia Postprocedure Evaluation (Signed)
Anesthesia Post Note  Patient: Stephanie Patel  Procedure(s) Performed: ESOPHAGOGASTRODUODENOSCOPY (EGD) WITH PROPOFOL (N/A ) COLONOSCOPY WITH PROPOFOL (N/A )  Patient location during evaluation: Endoscopy Anesthesia Type: General Level of consciousness: awake and alert Pain management: pain level controlled Vital Signs Assessment: post-procedure vital signs reviewed and stable Respiratory status: spontaneous breathing, nonlabored ventilation, respiratory function stable and patient connected to nasal cannula oxygen Cardiovascular status: blood pressure returned to baseline and stable Postop Assessment: no apparent nausea or vomiting Anesthetic complications: no   No complications documented.   Last Vitals:  Vitals:   09/21/19 1313 09/21/19 1315  BP: (!) 157/106 (!) 183/97  Pulse: 69 70  Resp: 16   Temp:    SpO2: 99%     Last Pain:  Vitals:   09/21/19 1210  TempSrc: Temporal  PainSc:                  Arita Miss

## 2019-09-21 NOTE — Op Note (Signed)
[facility] Gastroenterology Patient Name: Stephanie Patel Procedure Date: 09/21/2019 11:40 AM MRN: [id number] Account #: [id number] Date of Birth: May 26, 1945 Admit Type: Inpatient Age: 74 Room: [facility] ENDO ROOM 1 Gender: Female Note Status: Finalized Procedure:             Colonoscopy Indications:           Chronic diarrhea Providers:             Jonathon Bellows MD, MD Medicines:             Monitored Anesthesia Care Complications:         No immediate complications. Procedure:             Pre-Anesthesia Assessment:                        - Prior to the procedure, a History and Physical was                         performed, and patient medications, allergies and                         sensitivities were reviewed. The patient's tolerance                         of previous anesthesia was reviewed.                        - The risks and benefits of the procedure and the                         sedation options and risks were discussed with the                         patient. All questions were answered and informed                         consent was obtained.                        - ASA Grade Assessment: II - A patient with mild                         systemic disease.                        After obtaining informed consent, the colonoscope was                         passed under direct vision. Throughout the procedure,                         the patient's blood pressure, pulse, and oxygen                         saturations were monitored continuously. The was                         introduced through the anus and advanced to the the  cecum, identified by the appendiceal orifice. The                         colonoscopy was performed with ease. The patient                         tolerated the procedure well. The quality of the bowel                         preparation was excellent. Findings:      The perianal and digital rectal  examinations were normal.      Multiple small-mouthed diverticula were found in the sigmoid colon.      The exam was otherwise without abnormality on direct and retroflexion       views. Impression:            - Diverticulosis in the sigmoid colon.                        - The examination was otherwise normal on direct and                         retroflexion views.                        - No specimens collected. Recommendation:        - Return patient to hospital ward for ongoing care.                        - Advance diet as tolerated.                        - Continue present medications.                        - F/u bx with DR [facility] in clinic                        No blood seen anywhere in colon or EGD: consider                         capsule study as an outpatient Procedure Code(s):     --- Professional ---                        [phone], Colonoscopy, flexible; diagnostic, including                         collection of specimen(s) by brushing or washing, when                         performed (separate procedure) Diagnosis Code(s):     --- Professional ---                        K52.9, Noninfective gastroenteritis and colitis,                         unspecified                        K57.30, Diverticulosis of  large intestine without                         perforation or abscess without bleeding CPT copyright 2019 American Medical Association. All rights reserved. The codes documented in this report are preliminary and upon coder review may  be revised to meet current compliance requirements. Jonathon Bellows, MD Jonathon Bellows MD, MD 09/21/2019 12:12:52 PM This report has been signed electronically. Number of Addenda: 0 Note Initiated On: 09/21/2019 11:40 AM Scope Withdrawal Time: 0 hours 11 minutes 46 seconds  Total Procedure Duration: 0 hours 14 minutes 11 seconds  Estimated Blood Loss:  Estimated blood loss: none.      [facility]

## 2019-09-21 NOTE — Transfer of Care (Signed)
Immediate Anesthesia Transfer of Care Note  Patient: Stephanie Patel  Procedure(s) Performed: ESOPHAGOGASTRODUODENOSCOPY (EGD) WITH PROPOFOL (N/A ) COLONOSCOPY WITH PROPOFOL (N/A )  Patient Location: PACU and Endoscopy Unit  Anesthesia Type:General  Level of Consciousness: drowsy  Airway & Oxygen Therapy: Patient Spontanous Breathing  Post-op Assessment: Report given to RN  Post vital signs: stable  Last Vitals:  Vitals Value Taken Time  BP    Temp    Pulse    Resp    SpO2      Last Pain:  Vitals:   09/21/19 1114  TempSrc: Temporal  PainSc: 0-No pain         Complications: No complications documented.

## 2019-09-21 NOTE — Op Note (Signed)
[facility] Gastroenterology Patient Name: Stephanie Patel Procedure Date: 09/21/2019 11:39 AM MRN: [id number] Account #: [id number] Date of Birth: [birth year] Admit Type: Inpatient Age: 74 Room: [facility] ENDO ROOM 1 Gender: Female Note Status: Finalized Procedure:             Upper GI endoscopy Indications:           Melena Providers:             Jonathon Bellows MD, MD Referring MD:          Leonie Douglas. Doy Hutching, MD (Referring MD) Medicines:             Monitored Anesthesia Care Complications:         No immediate complications. Procedure:             Pre-Anesthesia Assessment:                        - Prior to the procedure, a History and Physical was                         performed, and patient medications, allergies and                         sensitivities were reviewed. The patient's tolerance                         of previous anesthesia was reviewed.                        - The risks and benefits of the procedure and the                         sedation options and risks were discussed with the                         patient. All questions were answered and informed                         consent was obtained.                        - ASA Grade Assessment: II - A patient with mild                         systemic disease.                        After obtaining informed consent, the endoscope was                         passed under direct vision. Throughout the procedure,                         the patient's blood pressure, pulse, and oxygen                         saturations were monitored continuously. The Endoscope                         was introduced through the mouth, and advanced  to the                         third part of duodenum. The upper GI endoscopy was                         accomplished with ease. The patient tolerated the                         procedure well. Findings:      The esophagus was normal.      The examined duodenum was normal.       A medium-sized hiatal hernia was present.      The cardia and gastric fundus were normal on retroflexion. Impression:            - Normal esophagus.                        - Normal examined duodenum.                        - Medium-sized hiatal hernia.                        - No specimens collected. Recommendation:        - Perform a colonoscopy today. Procedure Code(s):     --- Professional ---                        [phone], Esophagogastroduodenoscopy, flexible,                         transoral; diagnostic, including collection of                         specimen(s) by brushing or washing, when performed                         (separate procedure) Diagnosis Code(s):     --- Professional ---                        K44.9, Diaphragmatic hernia without obstruction or                         gangrene                        K92.1, Melena (includes Hematochezia) CPT copyright 2019 American Medical Association. All rights reserved. The codes documented in this report are preliminary and upon coder review may  be revised to meet current compliance requirements. Jonathon Bellows, MD Jonathon Bellows MD, MD 09/21/2019 11:54:21 AM This report has been signed electronically. Number of Addenda: 0 Note Initiated On: 09/21/2019 11:39 AM Estimated Blood Loss:  Estimated blood loss: none.      [facility]

## 2019-09-22 ENCOUNTER — Encounter: Payer: Self-pay | Admitting: Gastroenterology

## 2019-09-22 DIAGNOSIS — K5791 Diverticulosis of intestine, part unspecified, without perforation or abscess with bleeding: Secondary | ICD-10-CM

## 2019-09-22 LAB — CBC
HCT: 31.2 % — ABNORMAL LOW (ref 36.0–46.0)
Hemoglobin: 10.8 g/dL — ABNORMAL LOW (ref 12.0–15.0)
MCH: 32.3 pg (ref 26.0–34.0)
MCHC: 34.6 g/dL (ref 30.0–36.0)
MCV: 93.4 fL (ref 80.0–100.0)
Platelets: 158 10*3/uL (ref 150–400)
RBC: 3.34 MIL/uL — ABNORMAL LOW (ref 3.87–5.11)
RDW: 13.6 % (ref 11.5–15.5)
WBC: 6.3 10*3/uL (ref 4.0–10.5)
nRBC: 0 % (ref 0.0–0.2)

## 2019-09-22 LAB — POTASSIUM: Potassium: 3.6 mmol/L (ref 3.5–5.1)

## 2019-09-22 LAB — GLUCOSE, CAPILLARY: Glucose-Capillary: 139 mg/dL — ABNORMAL HIGH (ref 70–99)

## 2019-09-22 MED ORDER — LOSARTAN POTASSIUM 50 MG PO TABS
50.0000 mg | ORAL_TABLET | Freq: Two times a day (BID) | ORAL | 0 refills | Status: DC
Start: 2019-09-22 — End: 2021-08-15

## 2019-09-22 NOTE — Progress Notes (Signed)
Pt discharged per MD order. IV removed. Discharge instructions reviewed with pt. Pt verbalized understanding with all questions answered to pt satisfaction. Pt taken to car in wheelchair by staff.  

## 2019-09-22 NOTE — Care Management Important Message (Signed)
Important Message  Patient Details  Name: Stephanie Patel MRN: [id number] Date of Birth: 1945-02-08   Medicare Important Message Given:  Yes  Initial Medicare IM given by Patient Access Associate on 09/21/2019 at 9:30am.     Dannette Barbara 09/22/2019, 8:44 AM

## 2019-09-22 NOTE — Discharge Summary (Signed)
Stephanie Patel CVE:[id number] DOB: October 22, 1945 DOA: 09/19/2019  PCP: Idelle Crouch, MD  Admit date: 09/19/2019 Discharge date: 09/22/2019  Admitted From: Home Disposition: Home Home  Recommendations for Outpatient Follow-up:  1. Follow up with PCP in 1 week 2. Please obtain BMP/CBC in one week 3. Follow-up with GI in 1 week  Home Health: RN to manage medications   Discharge Condition:Stable CODE STATUS: Partial Diet recommendation: Heart Healthy  Brief/Interim Summary: Stephanie Patel is a 74 y.o. female with medical history significant of hypertension, GERD, depression, rheumatoid arthritis, interstitial lung disease/pulmonary fibrosis, C. difficile colitis, IBS, who presents with abdominal pain, dark stool.Pt states that she was doing a bowel prep for screening colonoscopy on the morning of admission, and noted color change of her stool.  The initial stool color was brown, then changed to dark black and looked like coffee-ground materials.  She also developed mild abdominal pain, which was located in epigastric abdomen. Her abdominal pain had completely resolved by the time she was in the ER  GIB (gastrointestinal bleeding): GI consulted, status post EGD and colonoscopy  Found with diverticulosis. Avoid NSAID and subcu heparin for now H&H stable    Urinary Retention: post procedure.  Bladder scan with 450 cc.   Received straight cath Overnight she started urinating and this morning she stated she has no problem with urination    Depression Continue with BuSpar and Pristiq      HTN, goal below 140/80 Losartan will be increased to 50 mg twice daily    ILD (interstitial lung disease) (HCC):Stable without any acute exacerbation Continue bronchodilators  Rheumatoid arthritis of multiple sites with negative rheumatoid factor ([place]): Patient on Humira as outpatient  Continue leflunomide     GERD (gastroesophageal reflux disease) Continue with PPI p.o.  on discharge  Hypokalemia: Repleted and today's potassium 3.6  Discharge Diagnoses:  Principal Problem:   GIB (gastrointestinal bleeding) Active Problems:   Depression   HTN, goal below 140/80   ILD (interstitial lung disease) (HCC)   Rheumatoid arthritis of multiple sites with negative rheumatoid factor (HCC)   GERD (gastroesophageal reflux disease)   Hypokalemia    Discharge Instructions  Discharge Instructions    Call MD for:  persistant nausea and vomiting   Complete by: As directed    Diet - low sodium heart healthy   Complete by: As directed    Discharge instructions   Complete by: As directed    Follow up with pcp about your blood pressure F/u with Dr. Alice Reichert   Face-to-face encounter (required for Medicare/Medicaid patients)   Complete by: As directed    I Nolberto Hanlon certify that this patient is under my care and that I, or a nurse practitioner or physician's assistant working with me, had a face-to-face encounter that meets the physician face-to-face encounter requirements with this patient on 09/22/2019. The encounter with the patient was in whole, or in part for the following medical condition(s) which is the primary reason for home health care (List medical condition): compliance with medical therapy, weakness   The encounter with the patient was in whole, or in part, for the following medical condition, which is the primary reason for home health care: weakness   I certify that, based on my findings, the following services are medically necessary home health services: Nursing   Reason for Medically Necessary Home Health Services: Skilled Nursing- Changes in Medication/Medication Management   My clinical findings support the need for the above services: Unable to leave home  safely without assistance and/or assistive device   Further, I certify that my clinical findings support that this patient is homebound due to: Unable to leave home safely without assistance    Home Health   Complete by: As directed    To provide the following care/treatments: RN   Increase activity slowly   Complete by: As directed      Allergies as of 09/22/2019      Reactions   Keflex [cephalexin] Hives   Peanut-containing Drug Products Other (See Comments)   Patient had an allergy panel and this was found to be an allergy for the patient   Aricept [donepezil] Diarrhea   Codeine Nausea And Vomiting, Other (See Comments)   Tolerates with anti-nausea medication (BM:[id number])   Hydrochlorothiazide Other (See Comments)   Leg cramps      Medication List    STOP taking these medications   bisoprolol 5 MG tablet Commonly known as: ZEBETA     TAKE these medications   acidophilus Caps capsule Take 1 capsule by mouth daily.   alendronate 70 MG tablet Commonly known as: FOSAMAX Take 70 mg by mouth once a week. Take with a full glass of water on an empty stomach.   buPROPion 300 MG 24 hr tablet Commonly known as: WELLBUTRIN XL Take 300 mg by mouth daily.   calcium carbonate 1500 (600 Ca) MG Tabs tablet Commonly known as: OSCAL Take 600 mg by mouth at bedtime.   cyanocobalamin 1000 MCG tablet Take by mouth.   desvenlafaxine 50 MG 24 hr tablet Commonly known as: PRISTIQ Take 50 mg by mouth daily.   donepezil 5 MG tablet Commonly known as: ARICEPT Take 5 mg by mouth at bedtime.   fluticasone 50 MCG/ACT nasal spray Commonly known as: FLONASE 1 SPRAY INTO BOTH NOSTRILS EVERY DAY AS NEEDED FOR ALLERGIES   Humira Pen 40 MG/0.4ML Pnkt Generic drug: Adalimumab INJECT 1 PEN UNDER THE SKIN EVERY 14 DAYS.   leflunomide 20 MG tablet Commonly known as: ARAVA Take 20 mg by mouth daily.   levocetirizine 5 MG tablet Commonly known as: XYZAL Take 5 mg by mouth every evening.   losartan 50 MG tablet Commonly known as: COZAAR Take 1 tablet (50 mg total) by mouth 2 (two) times daily. What changed: when to take this   metoprolol succinate 50 MG 24 hr  tablet Commonly known as: TOPROL-XL Take 50 mg by mouth daily. Take with or immediately following a meal.   pantoprazole 40 MG tablet Commonly known as: PROTONIX Take 40 mg by mouth 2 (two) times daily.   predniSONE 5 MG tablet Commonly known as: DELTASONE Take 5 mg by mouth daily.   risperiDONE 0.5 MG tablet Commonly known as: RISPERDAL Take 0.5 mg by mouth at bedtime.   tolterodine 4 MG 24 hr capsule Commonly known as: DETROL LA Take 4 mg by mouth at bedtime.   vitamin C 1000 MG tablet Take 1,000 mg by mouth daily.       Follow-up Information    Efrain Sella, MD. Go on 09/28/2019.   Specialty: Gastroenterology Why: 10:30AM Contact information: [place] [address] [phone]        Idelle Crouch, MD. Go on 09/26/2019.   Specialty: Internal Medicine Why: 1:30PM Contact information: [place] [address] [phone]              Allergies  Allergen Reactions  . Keflex [Cephalexin] Hives  . Peanut-Containing  Drug Products Other (See Comments)    Patient had an allergy panel and this was found to be an allergy for the patient  . Aricept [Donepezil] Diarrhea  . Codeine Nausea And Vomiting and Other (See Comments)    Tolerates with anti-nausea medication (UX:[id number])  . Hydrochlorothiazide Other (See Comments)    Leg cramps    Consultations:  GI   Procedures/Studies: US Abdomen Complete  Result Date: 08/31/2019 CLINICAL DATA:  Irritable bowel syndrome with diarrhea. EXAM: ABDOMEN ULTRASOUND COMPLETE COMPARISON:  Remote noncontrast abdominal CT 06/23/2012 FINDINGS: Gallbladder: Physiologically distended. Two mobile gallstones largest measuring 6 mm. No wall thickening or pericholecystic fluid. No sonographic Murphy sign noted by sonographer. Common bile duct: Diameter: 3 mm, normal. Liver: No focal lesion identified. Within normal limits in parenchymal echogenicity. Portal  vein is patent on color Doppler imaging with normal direction of blood flow towards the liver. IVC: No abnormality visualized. Pancreas: Visualized portion unremarkable. Distal pancreas obscured by bowel gas. Spleen: Size and appearance within normal limits. Right Kidney: Length: 9.0 cm. Echogenicity within normal limits. No mass or hydronephrosis visualized. Left Kidney: Length: 9.0 cm. Echogenicity within normal limits. No mass or hydronephrosis visualized. Abdominal aorta: No aneurysm visualized. Atherosclerotic calcifications noted. Other findings: No ascites. IMPRESSION: 1. Gallstones without sonographic findings of acute cholecystitis. No biliary dilatation. 2. Atherosclerosis of the abdominal aorta without aneurysm. 3. Otherwise negative abdominal ultrasound. Electronically Signed   By: Keith Rake M.D.   On: 08/31/2019 15:46      Subjective: Feels well.  No complaints.  Urinating  Discharge Exam: Vitals:   09/21/19 1948 09/22/19 0336  BP: 124/81 (!) 164/91  Pulse: 81 78  Resp: 18 16  Temp: 98 F (36.7 C) 97.9 F (36.6 C)  SpO2: 97% 96%   Vitals:   09/21/19 1347 09/21/19 1355 09/21/19 1948 09/22/19 0336  BP: (!) 195/68 (!) 164/98 124/81 (!) 164/91  Pulse: 67  81 78  Resp: 16  18 16   Temp:   98 F (36.7 C) 97.9 F (36.6 C)  TempSrc:   Oral Oral  SpO2:   97% 96%  Weight:      Height:        General: Pt is alert, awake, not in acute distress Cardiovascular: RRR, S1/S2 +, no rubs, no gallops Respiratory: CTA bilaterally, no wheezing, no rhonchi Abdominal: Soft, NT, ND, bowel sounds + Extremities: no edema, no cyanosis    The results of significant diagnostics from this hospitalization (including imaging, microbiology, ancillary and laboratory) are listed below for reference.     Microbiology: Recent Results (from the past 240 hour(s))  SARS CORONAVIRUS 2 (TAT 6-24 HRS) Nasopharyngeal Nasopharyngeal Swab     Status: None   Collection Time: 09/15/19  4:26 PM    Specimen: Nasopharyngeal Swab  Result Value Ref Range Status   SARS Coronavirus 2 NEGATIVE NEGATIVE Final    Comment: (NOTE) SARS-CoV-2 target nucleic acids are NOT DETECTED.  The SARS-CoV-2 RNA is generally detectable in upper and lower respiratory specimens during the acute phase of infection. Negative results do not preclude SARS-CoV-2 infection, do not rule out co-infections with other pathogens, and should not be used as the sole basis for treatment or other patient management decisions. Negative results must be combined with clinical observations, patient history, and epidemiological information. The expected result is Negative.  Fact Sheet for Patients: SugarRoll.be  Fact Sheet for Healthcare Providers: https://www.woods-mathews.com/  This test is not yet approved or cleared by the [place] FDA and  has  been authorized for detection and/or diagnosis of SARS-CoV-2 by FDA under an Emergency Use Authorization (EUA). This EUA will remain  in effect (meaning this test can be used) for the duration of the COVID-19 declaration under Se ction 564(b)(1) of the Act, 21 U.S.C. section 360bbb-3(b)(1), unless the authorization is terminated or revoked sooner.  Performed at [facility], [place] [address] [place], [place] [address]   SARS Coronavirus 2 by RT PCR (hospital order, performed in [facility] hospital lab) Nasopharyngeal Nasopharyngeal Swab     Status: None   Collection Time: 09/19/19  2:58 PM   Specimen: Nasopharyngeal Swab  Result Value Ref Range Status   SARS Coronavirus 2 NEGATIVE NEGATIVE Final    Comment: (NOTE) SARS-CoV-2 target nucleic acids are NOT DETECTED.  The SARS-CoV-2 RNA is generally detectable in upper and lower respiratory specimens during the acute phase of infection. The lowest concentration of SARS-CoV-2 viral copies this assay can detect is 250 copies / mL. A negative result does not preclude  SARS-CoV-2 infection and should not be used as the sole basis for treatment or other patient management decisions.  A negative result may occur with improper specimen collection / handling, submission of specimen other than nasopharyngeal swab, presence of viral mutation(s) within the areas targeted by this assay, and inadequate number of viral copies (<250 copies / mL). A negative result must be combined with clinical observations, patient history, and epidemiological information.  Fact Sheet for Patients:   StrictlyIdeas.no  Fact Sheet for Healthcare Providers: BankingDealers.co.za  This test is not yet approved or  cleared by the [place] FDA and has been authorized for detection and/or diagnosis of SARS-CoV-2 by FDA under an Emergency Use Authorization (EUA).  This EUA will remain in effect (meaning this test can be used) for the duration of the COVID-19 declaration under Section 564(b)(1) of the Act, 21 U.S.C. section 360bbb-3(b)(1), unless the authorization is terminated or revoked sooner.  Performed at [facility], [address] [place], [place] [address]      Labs: BNP (last 3 results) No results for input(s): BNP in the last 8760 hours. Basic Metabolic Panel: Recent Labs  Lab 09/19/19 0944 09/20/19 0457 09/21/19 0405 09/22/19 0453  NA 140 139  --   --   K 2.8* 3.2* 3.3* 3.6  CL 100 107  --   --   CO2 28 24  --   --   GLUCOSE 103* 94  --   --   BUN 11 8  --   --   CREATININE 0.80 0.61  --   --   CALCIUM 9.2 7.6*  --   --   MG  --  2.3  --   --    Liver Function Tests: Recent Labs  Lab 09/19/19 0944  AST 23  ALT 14  ALKPHOS 58  BILITOT 0.8  PROT 7.1  ALBUMIN 3.8   Recent Labs  Lab 09/19/19 0944  LIPASE 22   No results for input(s): AMMONIA in the last 168 hours. CBC: Recent Labs  Lab 09/19/19 0944 09/19/19 1450 09/20/19 1252 09/20/19 2031 09/21/19 0405 09/21/19 0836  09/22/19 0453  WBC 7.7   < > 5.4 5.5 5.1 5.2 6.3  NEUTROABS 4.5  --   --   --   --   --   --   HGB 13.5   < > 12.4 12.3 12.5 11.3* 10.8*  HCT 39.4   < > 36.6 37.4 36.3 33.8* 31.2*  MCV  93.4   < > 93.1 95.2 92.4 95.8 93.4  PLT 206   < > 184 193 183 161 158   < > = values in this interval not displayed.   Cardiac Enzymes: No results for input(s): CKTOTAL, CKMB, CKMBINDEX, TROPONINI in the last 168 hours. BNP: Invalid input(s): POCBNP CBG: Recent Labs  Lab 09/20/19 0828 09/21/19 0745 09/22/19 0824  GLUCAP 92 83 139*   D-Dimer No results for input(s): DDIMER in the last 72 hours. Hgb A1c No results for input(s): HGBA1C in the last 72 hours. Lipid Profile No results for input(s): CHOL, HDL, LDLCALC, TRIG, CHOLHDL, LDLDIRECT in the last 72 hours. Thyroid function studies No results for input(s): TSH, T4TOTAL, T3FREE, THYROIDAB in the last 72 hours.  Invalid input(s): FREET3 Anemia work up No results for input(s): VITAMINB12, FOLATE, FERRITIN, TIBC, IRON, RETICCTPCT in the last 72 hours. Urinalysis    Component Value Date/Time   COLORURINE YELLOW (A) 09/19/2019 1346   APPEARANCEUR CLEAR (A) 09/19/2019 1346   APPEARANCEUR Cloudy 06/19/2012 1507   LABSPEC 1.014 09/19/2019 1346   LABSPEC 1.025 06/19/2012 1507   PHURINE 6.0 09/19/2019 1346   GLUCOSEU NEGATIVE 09/19/2019 1346   GLUCOSEU Negative 06/19/2012 1507   HGBUR NEGATIVE 09/19/2019 1346   BILIRUBINUR NEGATIVE 09/19/2019 1346   BILIRUBINUR Negative 06/19/2012 1507   KETONESUR NEGATIVE 09/19/2019 1346   PROTEINUR NEGATIVE 09/19/2019 1346   NITRITE NEGATIVE 09/19/2019 1346   LEUKOCYTESUR NEGATIVE 09/19/2019 1346   LEUKOCYTESUR 2+ 06/19/2012 1507   Sepsis Labs Invalid input(s): PROCALCITONIN,  WBC,  LACTICIDVEN Microbiology Recent Results (from the past 240 hour(s))  SARS CORONAVIRUS 2 (TAT 6-24 HRS) Nasopharyngeal Nasopharyngeal Swab     Status: None   Collection Time: 09/15/19  4:26 PM   Specimen: Nasopharyngeal  Swab  Result Value Ref Range Status   SARS Coronavirus 2 NEGATIVE NEGATIVE Final    Comment: (NOTE) SARS-CoV-2 target nucleic acids are NOT DETECTED.  The SARS-CoV-2 RNA is generally detectable in upper and lower respiratory specimens during the acute phase of infection. Negative results do not preclude SARS-CoV-2 infection, do not rule out co-infections with other pathogens, and should not be used as the sole basis for treatment or other patient management decisions. Negative results must be combined with clinical observations, patient history, and epidemiological information. The expected result is Negative.  Fact Sheet for Patients: SugarRoll.be  Fact Sheet for Healthcare Providers: https://www.woods-mathews.com/  This test is not yet approved or cleared by the [place] FDA and  has been authorized for detection and/or diagnosis of SARS-CoV-2 by FDA under an Emergency Use Authorization (EUA). This EUA will remain  in effect (meaning this test can be used) for the duration of the COVID-19 declaration under Se ction 564(b)(1) of the Act, 21 U.S.C. section 360bbb-3(b)(1), unless the authorization is terminated or revoked sooner.  Performed at [facility], [place] [address], [place], [place] [address]   SARS Coronavirus 2 by RT PCR (hospital order, performed in [facility] hospital lab) Nasopharyngeal Nasopharyngeal Swab     Status: None   Collection Time: 09/19/19  2:58 PM   Specimen: Nasopharyngeal Swab  Result Value Ref Range Status   SARS Coronavirus 2 NEGATIVE NEGATIVE Final    Comment: (NOTE) SARS-CoV-2 target nucleic acids are NOT DETECTED.  The SARS-CoV-2 RNA is generally detectable in upper and lower respiratory specimens during the acute phase of infection. The lowest concentration of SARS-CoV-2 viral copies this assay can detect is 250 copies / mL. A negative result does  not preclude SARS-CoV-2 infection and should  not be used as the sole basis for treatment or other patient management decisions.  A negative result may occur with improper specimen collection / handling, submission of specimen other than nasopharyngeal swab, presence of viral mutation(s) within the areas targeted by this assay, and inadequate number of viral copies (<250 copies / mL). A negative result must be combined with clinical observations, patient history, and epidemiological information.  Fact Sheet for Patients:   StrictlyIdeas.no  Fact Sheet for Healthcare Providers: BankingDealers.co.za  This test is not yet approved or  cleared by the [place] FDA and has been authorized for detection and/or diagnosis of SARS-CoV-2 by FDA under an Emergency Use Authorization (EUA).  This EUA will remain in effect (meaning this test can be used) for the duration of the COVID-19 declaration under Section 564(b)(1) of the Act, 21 U.S.C. section 360bbb-3(b)(1), unless the authorization is terminated or revoked sooner.  Performed at [facility], [address] [place], [place] [address]      Time coordinating discharge: Over 30 minutes  SIGNED:   Nolberto Hanlon, MD  [facility] 09/22/2019, 5:01 PM Pager   If 7PM-7AM, please contact night-coverage [email or web address] Password [email or web address]

## 2019-09-22 NOTE — TOC Initial Note (Addendum)
Transition of Care [facility]) - Initial/Assessment Note    Patient Details  Name: Stephanie Patel MRN: [id number] Date of Birth: [birth year]  Transition of Care [facility]) CM/SW Contact:    Beverly Sessions, RN Phone Number: 09/22/2019, 2:03 PM  Clinical Narrative:                   Expected Discharge Plan: [place] Barriers to Discharge: No Barriers Identified   Patient Goals and CMS Choice    Patient admitted from home with GI bleed.  Patient lives at home alone Patient to discharge today  Daughter at bedside to provide transportation.   PCP Sparks Patient drives at baseline [facility] - denies issues obtaining medications  Patient states that she has a RW and cane in the home but does not use them  Patient agreeable to home health services and prefers [place].  Referral made and accepted by Corene Cornea with [place] health RN needed for medication management     Expected Discharge Plan and Services Expected Discharge Plan: [place]       Living arrangements for the past 2 months: Single Family Home Expected Discharge Date: 09/22/19                         HH Arranged: RN [place] Agency: [place] ([place]) Date HH Agency Contacted: 09/22/19   Representative spoke with at [place]: Corene Cornea  Prior Living Arrangements/Services Living arrangements for the past 2 months: [place] Lives with:: Self Patient language and need for interpreter reviewed:: Yes Do you feel safe going back to the place where you live?: Yes      Need for Family Participation in Patient Care: Yes (Comment) Care giver support system in place?: Yes (comment) Current home services: DME Criminal Activity/Legal Involvement Pertinent to Current Situation/Hospitalization: No - Comment as needed  Activities of Daily Living Home Assistive Devices/Equipment: None ADL Screening (condition at time of  admission) Patient's cognitive ability adequate to safely complete daily activities?: Yes Is the patient deaf or have difficulty hearing?: No Does the patient have difficulty seeing, even when wearing glasses/contacts?: No Does the patient have difficulty concentrating, remembering, or making decisions?: No Patient able to express need for assistance with ADLs?: Yes Does the patient have difficulty dressing or bathing?: No Independently performs ADLs?: Yes (appropriate for developmental age) Does the patient have difficulty walking or climbing stairs?: No Weakness of Legs: None Weakness of Arms/Hands: None  Permission Sought/Granted                  Emotional Assessment Appearance:: Appears stated age     Orientation: : Oriented to Self, Oriented to Place, Oriented to  Time, Oriented to Situation   Psych Involvement: No (comment)  Admission diagnosis:  Hypokalemia [E87.6] GIB (gastrointestinal bleeding) [K92.2] Acute GI bleeding [K92.2] Diarrhea, unspecified type [R19.7] Patient Active Problem List   Diagnosis Date Noted  . GIB (gastrointestinal bleeding) 09/19/2019  . GERD (gastroesophageal reflux disease) 09/19/2019  . Hypokalemia 09/19/2019  . CAP (community acquired pneumonia) 11/08/2016  . Right inguinal hernia 09/29/2016  . HTN, goal below 140/80 12/26/2015  . ILD (interstitial lung disease) ([place]) 03/28/2015  . Acute respiratory failure with hypoxia ([place]) 03/18/2015  . Closed torus fracture of distal end of right fibula 10/03/2013  . Depression 08/21/2013  . Environmental allergies 08/21/2013  . Irritable bowel syndrome 08/21/2013  .  LVH (left ventricular hypertrophy) 08/21/2013  . Osteoarthritis of knee 08/21/2013  . Vitamin D deficiency, unspecified 08/21/2013  . Rheumatoid arthritis of multiple sites with negative rheumatoid factor ([place]) 02/10/2012   PCP:  Idelle Crouch, MD Pharmacy:   [place], [place] [address] Phone: [phone] Fax: [phone]     Social Determinants of Health (SDOH) Interventions    Readmission Risk Interventions No flowsheet data found.

## 2019-09-25 LAB — SURGICAL PATHOLOGY

## 2019-10-02 ENCOUNTER — Encounter: Payer: Self-pay | Admitting: Gastroenterology

## 2019-10-02 ENCOUNTER — Telehealth: Payer: Self-pay

## 2019-10-02 NOTE — Telephone Encounter (Signed)
Pt has been notified of results and Dr. Anna's recommendations. 

## 2019-10-02 NOTE — Telephone Encounter (Signed)
-----   Message from Jonathon Bellows, MD sent at 10/02/2019  1:31 PM EDT ----- Sherald Hess informpatient biopsies show colitis throughout colon- suggest to follow up with Dr Alice Reichert in clinic   C/c Sparks, Leonie Douglas, MD   Dr Jonathon Bellows MD,MRCP [facility]) Gastroenterology/Hepatology Pager: [phone]

## 2020-01-30 ENCOUNTER — Encounter: Payer: Self-pay | Admitting: Pulmonary Disease

## 2020-01-30 ENCOUNTER — Ambulatory Visit (INDEPENDENT_AMBULATORY_CARE_PROVIDER_SITE_OTHER): Payer: Medicare Other | Admitting: Pulmonary Disease

## 2020-01-30 ENCOUNTER — Other Ambulatory Visit: Payer: Self-pay

## 2020-01-30 VITALS — BP 132/78 | HR 70 | Temp 97.3°F | Ht 60.0 in | Wt 131.4 lb

## 2020-01-30 DIAGNOSIS — M0510 Rheumatoid lung disease with rheumatoid arthritis of unspecified site: Secondary | ICD-10-CM

## 2020-01-30 DIAGNOSIS — R0982 Postnasal drip: Secondary | ICD-10-CM | POA: Diagnosis not present

## 2020-01-30 DIAGNOSIS — R0602 Shortness of breath: Secondary | ICD-10-CM

## 2020-01-30 DIAGNOSIS — R053 Chronic cough: Secondary | ICD-10-CM

## 2020-01-30 DIAGNOSIS — M0609 Rheumatoid arthritis without rheumatoid factor, multiple sites: Secondary | ICD-10-CM

## 2020-01-30 NOTE — Progress Notes (Signed)
Subjective:    Patient ID: Stephanie Patel, female    DOB: Jul 01, 1945, 75 y.o.   MRN: [id number] PATIENT BACKGROUND  PROBLEMS: Rheumatoid arthritis Pulmonary fibrosis - previously on MTX, prior patient of Dr. Alva Garnet  Hospitalized 3/13 - 03/25/15 with acute (?on chronic) hypoxemic respiratory failure and severe pneumonitis pattern on CXR, CT chest. Treated with empiric antibiotics and systemic steroids with gradual improvement. Discharged home on tapering prednisone, home O2  DATA: 03/18/15 CT chest:Chronic pulmonary fibrosis, bibasilar predominant, likely related to the given history of rheumatoid arthritis. Diffuse patchy ground-glass opacities throughout both lungs. 05/07/15 CT chest: Persistence of fibrotic changes. Resolution of diffuse GGOs 05/08/15 PFTs: Mild restriction, mod decrease DLCO. Minimal desaturation on 6 min walk (to 92%) 05/16/15 Overnight oximetry:relatively brief period of desaturation but to a low of 68% 11/13/15 PFTs: no obstruction, mild restriction, DLCO measurement invalid  08/12/16 6MWT:300 meters, SpO2 98>91% 11/24/16 PFTs: No obstruction, lung volumes low-normal, DLCO normal 11/04/18CTA chest:NSC interstitial prominence 11/25/17 PFTs:FVC:1.74L (73%pred), FEV1: 1.61L (86%pred), FEV1/FVC: 93%, TLC:2.73L (61%pred), DLCO 94%pred.No change after bronchodilator challenge. Flow volume curve consistent with mild restriction. Overall minimal change from prior PFTs except in total lung capacity (which might be a spurious finding)  INTERVAL HISTORY: Last seen by me on 19 January 2019 at that time no major issues with follow-up in 1 year recommended.  HPI Patient is a 75 year old, former smoker (quit 1978), with rheumatoid arthritis and known rheumatoid lung who presents for scheduled follow-up.  She notes that over the past year she has had multiple changes of her rheumatoid arthritis medications due to progression of disease.  She has noted  increased nasal congestion since on Remicade.  She has not had increased shortness of breath.  She has mild airways reactivity and chronic bronchitis for which she is on Breo 100/25 and feels that this is effective controlling her reactive airway symptoms.  She has not had any fevers, chills or sweats.  No cough or sputum production.  No orthopnea or paroxysmal nocturnal dyspnea.  No lower extremity edema.   Review of Systems A 10 point review of systems was performed and it is as noted above otherwise negative.  Patient Active Problem List   Diagnosis Date Noted  . GIB (gastrointestinal bleeding) 09/19/2019  . GERD (gastroesophageal reflux disease) 09/19/2019  . Hypokalemia 09/19/2019  . CAP (community acquired pneumonia) 11/08/2016  . Right inguinal hernia 09/29/2016  . HTN, goal below 140/80 12/26/2015  . ILD (interstitial lung disease) ([place]) 03/28/2015  . Acute respiratory failure with hypoxia ([place]) 03/18/2015  . Closed torus fracture of distal end of right fibula 10/03/2013  . Depression 08/21/2013  . Environmental allergies 08/21/2013  . Irritable bowel syndrome 08/21/2013  . LVH (left ventricular hypertrophy) 08/21/2013  . Osteoarthritis of knee 08/21/2013  . Vitamin D deficiency, unspecified 08/21/2013  . Rheumatoid arthritis of multiple sites with negative rheumatoid factor ([place]) 02/10/2012   Allergies  Allergen Reactions  . Keflex [Cephalexin] Hives  . Peanut-Containing Drug Products Other (See Comments)    Patient had an allergy panel and this was found to be an allergy for the patient  . Aricept [Donepezil] Diarrhea  . Codeine Nausea And Vomiting and Other (See Comments)    Tolerates with anti-nausea medication (JO:[id number])  . Hydrochlorothiazide Other (See Comments)    Leg cramps   Current Meds  Medication Sig  . acidophilus (RISAQUAD) CAPS capsule Take 1 capsule by mouth daily.  Marland Kitchen alendronate (FOSAMAX) 70 MG tablet Take 70 mg by mouth once  a week. Take with a  full glass of water on an empty stomach.  Marland Kitchen buPROPion (WELLBUTRIN XL) 300 MG 24 hr tablet Take 300 mg by mouth daily.   . calcium carbonate (OSCAL) 1500 (600 Ca) MG TABS tablet Take 600 mg by mouth at bedtime.  Marland Kitchen desvenlafaxine (PRISTIQ) 50 MG 24 hr tablet Take 50 mg by mouth daily.  Marland Kitchen donepezil (ARICEPT) 5 MG tablet Take 5 mg by mouth at bedtime.  . fluticasone (FLONASE) 50 MCG/ACT nasal spray 1 SPRAY INTO BOTH NOSTRILS EVERY DAY AS NEEDED FOR ALLERGIES  . fluticasone furoate-vilanterol (BREO ELLIPTA) 100-25 MCG/INH AEPB Inhale 1 puff into the lungs daily.  Marland Kitchen levocetirizine (XYZAL) 5 MG tablet Take 5 mg by mouth every evening.  Marland Kitchen losartan (COZAAR) 50 MG tablet Take 1 tablet (50 mg total) by mouth 2 (two) times daily.  . metoprolol succinate (TOPROL-XL) 50 MG 24 hr tablet Take 50 mg by mouth daily. Take with or immediately following a meal.  . pantoprazole (PROTONIX) 40 MG tablet Take 40 mg by mouth 2 (two) times daily.   . predniSONE (DELTASONE) 5 MG tablet Take 5 mg by mouth daily.  . risperiDONE (RISPERDAL) 0.5 MG tablet Take 0.5 mg by mouth at bedtime.  . tolterodine (DETROL LA) 4 MG 24 hr capsule Take 4 mg by mouth at bedtime.     Patient is on Remicade infusions for rheumatoid arthritis.  Immunization History  Administered Date(s) Administered  . Influenza Split 10/01/2014, 10/14/2015  . Influenza, High Dose Seasonal PF 10/05/2017, 09/26/2018  . Influenza-Unspecified 09/26/2012, 12/22/2015, 09/28/2019  . Moderna Sars-Covid-2 Vaccination 03/01/2019, 03/29/2019  . Pneumococcal Conjugate-13 03/10/2014  . Typhoid Inactivated 02/10/2012  . Zoster Recombinat (Shingrix) 01/20/2017      Objective:   Physical Exam BP 132/78 (BP Location: Left Arm, Cuff Size: Normal)   Pulse 70   Temp (!) 97.3 F (36.3 C) (Temporal)   Ht 5' (1.524 m)   Wt 131 lb 6.4 oz (59.6 kg)   SpO2 96%   BMI 25.66 kg/m  GENERAL: Well-developed, well-nourished, no acute distress.  Fully ambulatory.  No  conversational dyspnea. HEAD: Normocephalic, atraumatic.  EYES: Pupils equal, round, reactive to light.  No scleral icterus.  MOUTH: Nose/mouth/throat not examined due to masking requirements for COVID 19. NECK: Supple. No thyromegaly. Trachea midline. No JVD.  No adenopathy. PULMONARY: Good air entry bilaterally.  Mild basilar crackles otherwise no adventitious sounds. CARDIOVASCULAR: S1 and S2. Regular rate and rhythm.  No rubs, murmurs or gallops heard. ABDOMEN: Benign. MUSCULOSKELETAL: Bilateral changes of rheumatoid arthritis of hands, no clubbing, no edema.  NEUROLOGIC: No focal deficit, no gait disturbance, speech is fluent. SKIN: Intact,warm,dry.  On limited exam no rashes. PSYCH: Mood and behavior normal.  No recent imaging.  Labs obtained through [facility] reviewed.     Assessment & Plan:     ICD-10-CM   1. Rheumatoid lung ([place])  M05.10    Will restudy with pulmonary function testing   2. Chronic cough  R05.3 Pulmonary Function Test [facility] Only   PFTs as above Continue Breo for now  3. Shortness of breath  R06.02 Pulmonary Function Test [facility] Only    ECHOCARDIOGRAM COMPLETE   PFTs Check 2D echo query pulmonary hypertension  4. Post-nasal drip  R09.82    Zyrtec 10 mg (OTC) 1 tablet at nighttime  5. Rheumatoid arthritis of multiple sites with negative rheumatoid factor ([place])  M06.09    This issue adds complexity to her management Followed at [facility]  Rheumatology   Orders Placed This Encounter  Procedures  . Pulmonary Function Test [facility] Only    Standing Status:   Future    Standing Expiration Date:   01/29/2021    Scheduling Instructions:     Next available.    Order Specific Question:   Full PFT: includes the following: basic spirometry, spirometry pre & post bronchodilator, diffusion capacity (DLCO), lung volumes    Answer:   Full PFT  . ECHOCARDIOGRAM COMPLETE    Standing Status:   Future    Standing Expiration Date:   07/29/2020    Order Specific  Question:   Where should this test be performed    Answer:   [facility]    Order Specific Question:   Please indicate who you request to read the nuc med / echo results.    Answer:   [facility]    Order Specific Question:   Perflutren DEFINITY (image enhancing agent) should be administered unless hypersensitivity or allergy exist    Answer:   Administer Perflutren    Order Specific Question:   Reason for exam-Echo    Answer:   Dyspnea  R06.00   Discussion:  The patient has known rheumatoid lung.  She has had very difficult control of her rheumatoid arthritis and has required medication changes.  She is currently on Remicade infusions.  All of these agents can exacerbate respiratory symptoms.  In addition management of rheumatoid lung is control of rheumatoid arthritis.  Steroids usually do not help long-term.  Will obtain PFTs to evaluate for potential worsening of the patient's lung function.  She may also be developing issues with pulmonary hypertension which can be seen in rheumatoid arthritis.  2D echo will be obtained.  We will see the patient in follow-up in 3 months time however we will contact her prior to that should any abnormalities that require immediate attention are noted on her testing.  She is to contact us prior to her follow-up appointment should any new difficulties arise.   Renold Don, MD [facility]   *This note was dictated using voice recognition software/Dragon.  Despite best efforts to proofread, errors can occur which can change the meaning.  Any change was purely unintentional.

## 2020-01-30 NOTE — Patient Instructions (Signed)
For your postnasal drip I recommend Zyrtec (cetirizine) over-the-counter, 1 tablet at nighttime.  We are going to repeat breathing tests and a heart test to make sure that your rheumatoid arthritis has not affected your lungs further or your heart.  We will see you in follow-up in 3 months time, we will let you know the results of your tests as they come back.

## 2020-02-02 ENCOUNTER — Encounter: Payer: Self-pay | Admitting: Pulmonary Disease

## 2020-02-06 ENCOUNTER — Telehealth: Payer: Self-pay | Admitting: Pulmonary Disease

## 2020-02-06 NOTE — Telephone Encounter (Signed)
Dr. Gonzalez, please advise. Thanks 

## 2020-02-06 NOTE — Telephone Encounter (Signed)
When I was trying to schedule the PFT for Stephanie Patel on 01/30/20 she stated that she doesn't feel like she can do the PFT.  From what she remembers she was never able to complete a PFT.  She wants to know if there are any others test that can get the same results as a PFT

## 2020-02-07 ENCOUNTER — Other Ambulatory Visit: Payer: Self-pay | Admitting: Internal Medicine

## 2020-02-07 DIAGNOSIS — Z1231 Encounter for screening mammogram for malignant neoplasm of breast: Secondary | ICD-10-CM

## 2020-02-07 NOTE — Telephone Encounter (Signed)
Spoke to patient and relayed below recommendations.  She is willing to attempt the PFT.   Rodena Piety, please contact patient to schedule. Thanks

## 2020-02-07 NOTE — Telephone Encounter (Signed)
Unfortunately there is no other test that can last the PFT.  Lets give it a try at least.  If she is unable to do then we will appropriately document it.

## 2020-02-14 ENCOUNTER — Other Ambulatory Visit: Payer: Self-pay

## 2020-02-14 ENCOUNTER — Ambulatory Visit (INDEPENDENT_AMBULATORY_CARE_PROVIDER_SITE_OTHER): Payer: Medicare Other

## 2020-02-14 DIAGNOSIS — R0602 Shortness of breath: Secondary | ICD-10-CM

## 2020-02-14 LAB — ECHOCARDIOGRAM COMPLETE
AR max vel: 2.77 cm2
AV Area VTI: 2.82 cm2
AV Area mean vel: 2.43 cm2
AV Mean grad: 3 mmHg
AV Peak grad: 5.5 mmHg
Ao pk vel: 1.17 m/s
Area-P 1/2: 2.28 cm2
Calc EF: 75.1 %
S' Lateral: 3.1 cm
Single Plane A2C EF: 74.6 %
Single Plane A4C EF: 77.2 %

## 2020-02-15 ENCOUNTER — Telehealth: Payer: Self-pay | Admitting: Pulmonary Disease

## 2020-02-15 NOTE — Telephone Encounter (Signed)
Tyler Pita, MD  02/15/2020 11:36 AM EST      Echocardiogram (heart test) shows that her left main chamber is a little stiff. Sometimes this can add to the sensation of shortness of breath. Management of this is control of blood pressure this can be monitored by her primary care physician. Additionally, there was mild enlargement of the main blood vessel. This was noted on a prior CT scan of 2018. It appears that this is stable from then but should be followed with a yearly scan. This can also be arranged through her primary care physician's office.   Patient is aware of results and voiced her understanding.  Nothing further needed.

## 2020-02-23 ENCOUNTER — Ambulatory Visit
Admission: RE | Admit: 2020-02-23 | Discharge: 2020-02-23 | Disposition: A | Discharge status: Home / Self Care | Payer: Medicare Other | Source: Ambulatory Visit | Attending: Internal Medicine | Admitting: Internal Medicine

## 2020-02-23 ENCOUNTER — Other Ambulatory Visit: Payer: Self-pay

## 2020-02-23 DIAGNOSIS — Z1231 Encounter for screening mammogram for malignant neoplasm of breast: Secondary | ICD-10-CM | POA: Insufficient documentation

## 2020-02-26 NOTE — Telephone Encounter (Signed)
I have left a message for Stephanie Patel to call me to schedule her PFT and covid test

## 2020-02-26 NOTE — Telephone Encounter (Signed)
I have spoke with Stephanie Patel and her Covid Test has been scheduled on 03/11/20 between 8-1:00pm PFT has been scheduled on 03/12/2020 @ 2:00pm arrival time 1:45pm

## 2020-03-08 ENCOUNTER — Telehealth: Payer: Self-pay

## 2020-03-08 NOTE — Telephone Encounter (Signed)
Lm for reminder of covid test prior to PFT  03/11/2020 [facility]

## 2020-03-08 NOTE — Telephone Encounter (Signed)
Patient informed of Covid 19 test for 03/11/2020.

## 2020-03-08 NOTE — Telephone Encounter (Signed)
Noted  

## 2020-03-11 ENCOUNTER — Other Ambulatory Visit: Payer: Self-pay

## 2020-03-11 ENCOUNTER — Other Ambulatory Visit
Admission: RE | Admit: 2020-03-11 | Discharge: 2020-03-11 | Disposition: A | Discharge status: Home / Self Care | Payer: Medicare Other | Source: Ambulatory Visit | Attending: Pulmonary Disease | Admitting: Pulmonary Disease

## 2020-03-11 DIAGNOSIS — Z01812 Encounter for preprocedural laboratory examination: Secondary | ICD-10-CM | POA: Insufficient documentation

## 2020-03-11 DIAGNOSIS — Z20822 Contact with and (suspected) exposure to covid-19: Secondary | ICD-10-CM | POA: Diagnosis not present

## 2020-03-12 ENCOUNTER — Ambulatory Visit: Payer: Medicare Other | Attending: Pulmonary Disease

## 2020-03-12 DIAGNOSIS — R053 Chronic cough: Secondary | ICD-10-CM | POA: Diagnosis present

## 2020-03-12 DIAGNOSIS — R0602 Shortness of breath: Secondary | ICD-10-CM

## 2020-03-12 DIAGNOSIS — Z79899 Other long term (current) drug therapy: Secondary | ICD-10-CM | POA: Insufficient documentation

## 2020-03-12 DIAGNOSIS — Z7951 Long term (current) use of inhaled steroids: Secondary | ICD-10-CM | POA: Insufficient documentation

## 2020-03-12 LAB — PULMONARY FUNCTION TEST ARMC ONLY
DL/VA % pred: 80 %
DL/VA: 3.41 ml/min/mmHg/L
DLCO unc % pred: 65 %
DLCO unc: 10.87 ml/min/mmHg
FEF 25-75 Post: 2.25 L/sec
FEF 25-75 Pre: 2.2 L/sec
FEF2575-%Change-Post: 1 %
FEF2575-%Pred-Post: 156 %
FEF2575-%Pred-Pre: 153 %
FEV1-%Change-Post: 4 %
FEV1-%Pred-Post: 86 %
FEV1-%Pred-Pre: 82 %
FEV1-Post: 1.51 L
FEV1-Pre: 1.44 L
FEV1FVC-%Change-Post: 0 %
FEV1FVC-%Pred-Pre: 121 %
FEV6-%Change-Post: 5 %
FEV6-%Pred-Post: 75 %
FEV6-%Pred-Pre: 71 %
FEV6-Post: 1.66 L
FEV6-Pre: 1.58 L
FEV6FVC-%Pred-Post: 105 %
FEV6FVC-%Pred-Pre: 105 %
FVC-%Change-Post: 5 %
FVC-%Pred-Post: 71 %
FVC-%Pred-Pre: 67 %
FVC-Post: 1.66 L
Post FEV1/FVC ratio: 91 %
Post FEV6/FVC ratio: 100 %
Pre FEV1/FVC ratio: 91 %
Pre FEV6/FVC Ratio: 100 %
RV % pred: 32 %
RV: 0.67 L
TLC % pred: 61 %
TLC: 2.72 L

## 2020-03-12 LAB — SARS CORONAVIRUS 2 (TAT 6-24 HRS): SARS Coronavirus 2: NEGATIVE

## 2020-03-12 MED ORDER — ALBUTEROL SULFATE (2.5 MG/3ML) 0.083% IN NEBU
2.5000 mg | INHALATION_SOLUTION | Freq: Once | RESPIRATORY_TRACT | Status: AC
  Administered 2020-03-12: 2.5 mg via RESPIRATORY_TRACT
  Filled 2020-03-12: qty 3

## 2020-03-22 ENCOUNTER — Other Ambulatory Visit: Payer: Self-pay

## 2020-03-22 DIAGNOSIS — M0510 Rheumatoid lung disease with rheumatoid arthritis of unspecified site: Secondary | ICD-10-CM

## 2020-03-25 ENCOUNTER — Other Ambulatory Visit: Payer: Self-pay | Admitting: Pulmonary Disease

## 2020-04-10 ENCOUNTER — Ambulatory Visit: Payer: Medicare Other | Attending: Pulmonary Disease

## 2020-05-03 ENCOUNTER — Other Ambulatory Visit: Payer: Self-pay

## 2020-05-03 ENCOUNTER — Ambulatory Visit
Admission: RE | Admit: 2020-05-03 | Discharge: 2020-05-03 | Disposition: A | Discharge status: Home / Self Care | Payer: Medicare Other | Source: Ambulatory Visit | Attending: Pulmonary Disease | Admitting: Pulmonary Disease

## 2020-05-03 DIAGNOSIS — M0510 Rheumatoid lung disease with rheumatoid arthritis of unspecified site: Secondary | ICD-10-CM | POA: Diagnosis present

## 2020-05-09 ENCOUNTER — Other Ambulatory Visit: Payer: Self-pay

## 2020-05-09 ENCOUNTER — Encounter: Payer: Self-pay | Admitting: Pulmonary Disease

## 2020-05-09 ENCOUNTER — Ambulatory Visit (INDEPENDENT_AMBULATORY_CARE_PROVIDER_SITE_OTHER): Payer: Medicare Other | Admitting: Pulmonary Disease

## 2020-05-09 VITALS — BP 98/72 | HR 74 | Temp 97.3°F | Ht 60.0 in | Wt 133.2 lb

## 2020-05-09 DIAGNOSIS — R0602 Shortness of breath: Secondary | ICD-10-CM

## 2020-05-09 DIAGNOSIS — J3089 Other allergic rhinitis: Secondary | ICD-10-CM

## 2020-05-09 DIAGNOSIS — J42 Unspecified chronic bronchitis: Secondary | ICD-10-CM

## 2020-05-09 DIAGNOSIS — M0510 Rheumatoid lung disease with rheumatoid arthritis of unspecified site: Secondary | ICD-10-CM | POA: Diagnosis not present

## 2020-05-09 DIAGNOSIS — R053 Chronic cough: Secondary | ICD-10-CM

## 2020-05-09 NOTE — Patient Instructions (Signed)
You can use Zyrtec 1 tablet at bedtime for allergies  During allergy season recommend that you use your Breo 1 puff once daily.  Make sure you rinse your mouth well after you use it.   We will see you in follow-up in 6 months time call sooner should any new difficulties arise.

## 2020-05-09 NOTE — Progress Notes (Signed)
Subjective:    Patient ID: Stephanie Patel, female    DOB: 22-May-1945, 75 y.o.   MRN: [id number]   Chief Complaint  Patient presents with  . Follow-up    Breathing same, sob with long activity, occasional cough    PROBLEMS: Rheumatoid arthritis Pulmonary fibrosis - previously on MTX, prior patient of Dr. Alva Garnet  Hospitalized 3/13 - 03/25/15 with acute (?on chronic) hypoxemic respiratory failure and severe pneumonitis pattern on CXR, CT chest. Treated with empiric antibiotics and systemic steroids with gradual improvement. Discharged home on tapering prednisone, home O2  DATA: 03/18/15 CT chest:Chronic pulmonary fibrosis, bibasilar predominant, likely related to the given history of rheumatoid arthritis. Diffuse patchy ground-glass opacities throughout both lungs. 05/07/15 CT chest: Persistence of fibrotic changes. Resolution of diffuse GGOs 05/08/15 PFTs: Mild restriction, mod decrease DLCO. Minimal desaturation on 6 min walk (to 92%) 05/16/15 Overnight oximetry:relatively brief period of desaturation but to a low of 68% 11/13/15 PFTs: no obstruction, mild restriction, DLCO measurement invalid  08/12/16 6MWT:300 meters, SpO2 98>91% 11/24/16 PFTs: No obstruction, lung volumes low-normal, DLCO normal 11/04/18CTA chest:NSC interstitial prominence 11/25/17 PFTs:FVC:1.74L (73%pred), FEV1: 1.61L (86%pred), FEV1/FVC: 93%, TLC:2.73L (61%pred), DLCO 94%pred.No change after bronchodilator challenge. Flow volume curve consistent with mild restriction. Overall minimal change from prior PFTs except in total lung capacity (which might be a spurious finding) 02/14/2020 2D echo: LVEF 60 to 65%.  Wall motion abnormalities.  Mild left ventricular hypertrophy.  Grade I DD.  No evidence of pulmonary hypertension 03/12/2020 PFTs: FEV1 1.44 L or 82% predicted, FVC 1.58 L or 67% predicted FEV1/FVC 91%.  No bronchodilator response.  Consistent with mild restriction.  Diffusion capacity normal.   Overall, no significant/statistical change from prior 05/03/2020 high resolution CT chest: Persistent fibrotic changes, no GGO's.  Minimal to no change from 2018.  INTERVAL HISTORY: Last seen by me on 30 January 2020.  No major issues since then.  Dyspnea better.  "Allergy issues.  HPI Stephanie Patel is a 15 year old, former smoker (quit 1978) with rheumatoid arthritis and known rheumatoid lung who presents for scheduled follow-up.  She has now been on Humira for management of her rheumatoid arthritis.  She notes that her shortness of breath has been stable and as a matter fact getting somewhat better.  She has had in the past mild airways reactivity with chronic bronchitis for which she is on Breo 100/25.  She has not been using this regularly but only as she feels that she needs it.  However with the seasonal change in weather and pollen she has noted some increase congestion and cough.  She notes that when she uses the [facility] it is effective.  She is not taking regular allergy medication and is not using her Flonase regularly.  She has not had any fevers, chills or sweats.  After her last visit we ordered 2D echo to evaluate for potential pulmonary hypertension, this did not show pulmonary hypertension and overall was only consistent with mild diastolic dysfunction.  PFTs have not shown significant change from prior she continues to show mild restrictive physiology CT chest also showed stable changes.  Otherwise she feels well and looks well.   Review of Systems A 10 point review of systems was performed and it is as noted above otherwise negative.  Patient Active Problem List   Diagnosis Date Noted  . GIB (gastrointestinal bleeding) 09/19/2019  . GERD (gastroesophageal reflux disease) 09/19/2019  . Hypokalemia 09/19/2019  . CAP (community acquired pneumonia) 11/08/2016  . Right inguinal hernia  09/29/2016  . HTN, goal below 140/80 12/26/2015  . ILD (interstitial lung disease) ([place]) 03/28/2015  .  Acute respiratory failure with hypoxia ([place]) 03/18/2015  . Closed torus fracture of distal end of right fibula 10/03/2013  . Depression 08/21/2013  . Environmental allergies 08/21/2013  . Irritable bowel syndrome 08/21/2013  . LVH (left ventricular hypertrophy) 08/21/2013  . Osteoarthritis of knee 08/21/2013  . Vitamin D deficiency, unspecified 08/21/2013  . Rheumatoid arthritis of multiple sites with negative rheumatoid factor ([place]) 02/10/2012   Social History   Tobacco Use  . Smoking status: Former Smoker    Packs/day: 0.50    Years: 15.00    Pack years: 7.50    Quit date: 06/05/1976    Years since quitting: 43.9  . Smokeless tobacco: Never Used  Substance Use Topics  . Alcohol use: Yes    Comment: Wine occasional   Allergies  Allergen Reactions  . Keflex [Cephalexin] Hives  . Peanut-Containing Drug Products Other (See Comments)    Patient had an allergy panel and this was found to be an allergy for the patient  . Aricept [Donepezil] Diarrhea  . Codeine Nausea And Vomiting and Other (See Comments)    Tolerates with anti-nausea medication (MH:[id number])  . Hydrochlorothiazide Other (See Comments)    Leg cramps   Current Meds  Medication Sig  . acidophilus (RISAQUAD) CAPS capsule Take 1 capsule by mouth daily.  . Adalimumab (HUMIRA PEN) 40 MG/0.4ML PNKT   . alendronate (FOSAMAX) 70 MG tablet Take 70 mg by mouth once a week. Take with a full glass of water on an empty stomach.  . Ascorbic Acid (VITAMIN C) 1000 MG tablet Take 1,000 mg by mouth daily.  Marland Kitchen BREO ELLIPTA 100-25 MCG/INH AEPB INHALE 1 PUFF INTO THE LUNGS DAILY AS DIRECTED  . buPROPion (WELLBUTRIN XL) 300 MG 24 hr tablet Take 300 mg by mouth daily.   . calcium carbonate (OSCAL) 1500 (600 Ca) MG TABS tablet Take 600 mg by mouth at bedtime.  Marland Kitchen desvenlafaxine (PRISTIQ) 50 MG 24 hr tablet Take 50 mg by mouth daily.  Marland Kitchen donepezil (ARICEPT) 5 MG tablet Take 5 mg by mouth at bedtime.  . fluticasone (FLONASE) 50 MCG/ACT  nasal spray 1 SPRAY INTO BOTH NOSTRILS EVERY DAY AS NEEDED FOR ALLERGIES  . leflunomide (ARAVA) 20 MG tablet Take 20 mg by mouth daily.  Marland Kitchen levocetirizine (XYZAL) 5 MG tablet Take 5 mg by mouth every evening.  Marland Kitchen losartan (COZAAR) 50 MG tablet Take 1 tablet (50 mg total) by mouth 2 (two) times daily.  . methotrexate (RHEUMATREX) 2.5 MG tablet Take by mouth.  . metoprolol succinate (TOPROL-XL) 50 MG 24 hr tablet Take 50 mg by mouth daily. Take with or immediately following a meal.  . pantoprazole (PROTONIX) 40 MG tablet Take 40 mg by mouth 2 (two) times daily.   . predniSONE (DELTASONE) 5 MG tablet Take 5 mg by mouth daily.  . risperiDONE (RISPERDAL) 0.5 MG tablet Take 0.5 mg by mouth at bedtime.  . tolterodine (DETROL LA) 4 MG 24 hr capsule Take 4 mg by mouth at bedtime.    Immunization History  Administered Date(s) Administered  . Influenza Split 10/01/2014, 10/14/2015  . Influenza, High Dose Seasonal PF 09/03/2016, 10/05/2017, 09/26/2018  . Influenza-Unspecified 09/26/2012, 12/22/2015, 09/28/2019  . Moderna Sars-Covid-2 Vaccination 03/01/2019, 03/29/2019  . Pneumococcal Conjugate-13 03/10/2014, 01/11/2017  . Typhoid Inactivated 02/10/2012  . Zoster Recombinat (Shingrix) 09/23/2016, 01/20/2017, 06/20/2018      Objective:   Physical Exam BP 98/72 (  BP Location: Left Arm, Patient Position: Sitting, Cuff Size: Normal)   Pulse 74   Temp (!) 97.3 F (36.3 C) (Temporal)   Ht 5' (1.524 m)   Wt 133 lb 3.2 oz (60.4 kg)   SpO2 97%   BMI 26.01 kg/m  GENERAL: Well-developed, well-nourished, no acute distress.  Fully ambulatory.  No conversational dyspnea. HEAD: Normocephalic, atraumatic.  EYES: Pupils equal, round, reactive to light.  No scleral icterus.  MOUTH: Nose/mouth/throat not examined due to masking requirements for COVID 19. NECK: Supple. No thyromegaly. Trachea midline. No JVD.  No adenopathy. PULMONARY: Good air entry bilaterally.  Mild basilar crackles otherwise no adventitious  sounds. CARDIOVASCULAR: S1 and S2. Regular rate and rhythm.  No rubs, murmurs or gallops heard. ABDOMEN: Benign. MUSCULOSKELETAL: Bilateral changes of rheumatoid arthritis of hands, no clubbing, no edema.  NEUROLOGIC: No focal deficit, no gait disturbance, speech is fluent. SKIN: Intact,warm,dry.  On limited exam no rashes. PSYCH: Mood and behavior normal.      Assessment & Plan:     ICD-10-CM   1. Rheumatoid lung ([place])  M05.10    Appears to be stable Management is with control of rheumatoid arthritis  2. Chronic bronchitis, unspecified chronic bronchitis type ([facility])  J42    Use Breo Ellipta daily at least during "allergy" season   3. Shortness of breath  R06.02    Improved per patient  4. Chronic cough  R05.3    Responds to [employer] daily   5. Perennial allergic rhinitis  J30.89    Zyrtec during flares Use Flonase during flares   Patient will see [place] in follow-up in 6 months time she is to contact us prior to that time should any new difficulties arise.  Renold Don, MD [facility]   *This note was dictated using voice recognition software/Dragon.  Despite best efforts to proofread, errors can occur which can change the meaning.  Any change was purely unintentional.

## 2020-06-27 ENCOUNTER — Ambulatory Visit: Payer: Medicare Other | Admitting: Physical Therapy

## 2020-07-23 ENCOUNTER — Ambulatory Visit: Payer: Medicare Other | Attending: Internal Medicine | Admitting: Physical Therapy

## 2020-07-24 ENCOUNTER — Ambulatory Visit: Payer: Medicare Other | Admitting: Physical Therapy

## 2020-07-25 ENCOUNTER — Ambulatory Visit: Payer: Medicare Other | Admitting: Physical Therapy

## 2020-07-31 ENCOUNTER — Encounter: Payer: Medicare Other | Admitting: Physical Therapy

## 2020-08-02 ENCOUNTER — Encounter: Payer: Medicare Other | Admitting: Physical Therapy

## 2020-08-06 ENCOUNTER — Encounter: Payer: Medicare Other | Admitting: Physical Therapy

## 2020-08-08 ENCOUNTER — Encounter: Payer: Medicare Other | Admitting: Physical Therapy

## 2020-08-13 ENCOUNTER — Encounter: Payer: Medicare Other | Admitting: Physical Therapy

## 2020-08-15 ENCOUNTER — Encounter: Payer: Medicare Other | Admitting: Physical Therapy

## 2020-08-20 ENCOUNTER — Encounter: Payer: Medicare Other | Admitting: Physical Therapy

## 2020-08-22 ENCOUNTER — Encounter: Payer: Medicare Other | Admitting: Physical Therapy

## 2020-08-27 ENCOUNTER — Encounter: Payer: Medicare Other | Admitting: Physical Therapy

## 2020-08-29 ENCOUNTER — Encounter: Payer: Medicare Other | Admitting: Physical Therapy

## 2020-09-03 ENCOUNTER — Encounter: Payer: Medicare Other | Admitting: Physical Therapy

## 2020-11-19 ENCOUNTER — Ambulatory Visit: Payer: Medicare Other | Attending: Internal Medicine | Admitting: Physical Therapy

## 2020-11-19 DIAGNOSIS — M6281 Muscle weakness (generalized): Secondary | ICD-10-CM | POA: Diagnosis present

## 2020-11-19 DIAGNOSIS — R2681 Unsteadiness on feet: Secondary | ICD-10-CM

## 2020-11-19 DIAGNOSIS — R296 Repeated falls: Secondary | ICD-10-CM

## 2020-11-19 DIAGNOSIS — R262 Difficulty in walking, not elsewhere classified: Secondary | ICD-10-CM | POA: Diagnosis present

## 2020-11-19 DIAGNOSIS — G8929 Other chronic pain: Secondary | ICD-10-CM

## 2020-11-19 DIAGNOSIS — M25561 Pain in right knee: Secondary | ICD-10-CM | POA: Diagnosis present
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
## 2020-11-20 ENCOUNTER — Encounter: Payer: Self-pay | Admitting: Physical Therapy

## 2020-11-21 ENCOUNTER — Encounter: Payer: Self-pay | Admitting: Physical Therapy

## 2020-11-21 ENCOUNTER — Ambulatory Visit: Payer: Medicare Other | Admitting: Physical Therapy

## 2020-11-21 DIAGNOSIS — R296 Repeated falls: Secondary | ICD-10-CM

## 2020-11-21 DIAGNOSIS — M6281 Muscle weakness (generalized): Secondary | ICD-10-CM

## 2020-11-21 DIAGNOSIS — R2681 Unsteadiness on feet: Secondary | ICD-10-CM | POA: Diagnosis not present

## 2020-11-21 DIAGNOSIS — G8929 Other chronic pain: Secondary | ICD-10-CM

## 2020-11-21 DIAGNOSIS — R262 Difficulty in walking, not elsewhere classified: Secondary | ICD-10-CM
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
## 2020-11-26 ENCOUNTER — Ambulatory Visit: Payer: Medicare Other | Admitting: Physical Therapy

## 2020-11-26 ENCOUNTER — Encounter: Payer: Self-pay | Admitting: Physical Therapy

## 2020-11-26 DIAGNOSIS — M6281 Muscle weakness (generalized): Secondary | ICD-10-CM

## 2020-11-26 DIAGNOSIS — R2681 Unsteadiness on feet: Secondary | ICD-10-CM

## 2020-11-26 DIAGNOSIS — R296 Repeated falls: Secondary | ICD-10-CM

## 2020-11-26 DIAGNOSIS — G8929 Other chronic pain: Secondary | ICD-10-CM

## 2020-11-26 DIAGNOSIS — R262 Difficulty in walking, not elsewhere classified: Secondary | ICD-10-CM
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
## 2020-12-03 ENCOUNTER — Other Ambulatory Visit: Payer: Self-pay

## 2020-12-03 ENCOUNTER — Encounter: Payer: Self-pay | Admitting: Physical Therapy

## 2020-12-03 ENCOUNTER — Ambulatory Visit: Payer: Medicare Other | Admitting: Physical Therapy

## 2020-12-03 DIAGNOSIS — R2681 Unsteadiness on feet: Secondary | ICD-10-CM | POA: Diagnosis not present

## 2020-12-03 DIAGNOSIS — M6281 Muscle weakness (generalized): Secondary | ICD-10-CM

## 2020-12-03 DIAGNOSIS — G8929 Other chronic pain: Secondary | ICD-10-CM

## 2020-12-03 DIAGNOSIS — R262 Difficulty in walking, not elsewhere classified: Secondary | ICD-10-CM

## 2020-12-03 DIAGNOSIS — R296 Repeated falls: Secondary | ICD-10-CM
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
## 2020-12-04 ENCOUNTER — Encounter: Payer: Medicare Other | Admitting: Physical Therapy

## 2020-12-09 ENCOUNTER — Ambulatory Visit: Payer: Medicare Other | Admitting: Physical Therapy

## 2020-12-11 ENCOUNTER — Encounter: Payer: Self-pay | Admitting: Physical Therapy

## 2020-12-11 ENCOUNTER — Ambulatory Visit: Payer: Medicare Other | Attending: Internal Medicine | Admitting: Physical Therapy

## 2020-12-11 DIAGNOSIS — R296 Repeated falls: Secondary | ICD-10-CM

## 2020-12-11 DIAGNOSIS — M6281 Muscle weakness (generalized): Secondary | ICD-10-CM

## 2020-12-11 DIAGNOSIS — G8929 Other chronic pain: Secondary | ICD-10-CM

## 2020-12-11 DIAGNOSIS — R2681 Unsteadiness on feet: Secondary | ICD-10-CM

## 2020-12-11 DIAGNOSIS — R262 Difficulty in walking, not elsewhere classified: Secondary | ICD-10-CM

## 2020-12-11 DIAGNOSIS — M25561 Pain in right knee: Secondary | ICD-10-CM | POA: Insufficient documentation
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
## 2020-12-16 ENCOUNTER — Ambulatory Visit: Payer: Medicare Other | Admitting: Physical Therapy

## 2020-12-18 ENCOUNTER — Ambulatory Visit: Payer: Medicare Other | Admitting: Physical Therapy

## 2020-12-24 ENCOUNTER — Ambulatory Visit: Payer: Medicare Other | Admitting: Physical Therapy

## 2020-12-24 ENCOUNTER — Encounter: Payer: Self-pay | Admitting: Physical Therapy

## 2020-12-24 DIAGNOSIS — R296 Repeated falls: Secondary | ICD-10-CM | POA: Diagnosis present

## 2020-12-24 DIAGNOSIS — M6281 Muscle weakness (generalized): Secondary | ICD-10-CM | POA: Diagnosis present

## 2020-12-24 DIAGNOSIS — M25561 Pain in right knee: Secondary | ICD-10-CM | POA: Diagnosis present

## 2020-12-24 DIAGNOSIS — R2681 Unsteadiness on feet: Secondary | ICD-10-CM

## 2020-12-24 DIAGNOSIS — R262 Difficulty in walking, not elsewhere classified: Secondary | ICD-10-CM | POA: Diagnosis present

## 2020-12-24 DIAGNOSIS — G8929 Other chronic pain: Secondary | ICD-10-CM | POA: Diagnosis present
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
## 2020-12-26 ENCOUNTER — Ambulatory Visit: Payer: Medicare Other | Admitting: Physical Therapy

## 2021-01-01 ENCOUNTER — Ambulatory Visit: Payer: Medicare Other | Admitting: Physical Therapy

## 2021-01-02 ENCOUNTER — Encounter: Payer: Medicare Other | Admitting: Physical Therapy

## 2021-01-09 ENCOUNTER — Encounter: Payer: Self-pay | Admitting: Pulmonary Disease

## 2021-01-09 ENCOUNTER — Other Ambulatory Visit: Payer: Self-pay

## 2021-01-09 ENCOUNTER — Ambulatory Visit (INDEPENDENT_AMBULATORY_CARE_PROVIDER_SITE_OTHER): Payer: Medicare PPO | Admitting: Pulmonary Disease

## 2021-01-09 VITALS — BP 126/70 | HR 84 | Temp 97.7°F | Ht 60.0 in | Wt 125.2 lb

## 2021-01-09 DIAGNOSIS — J45998 Other asthma: Secondary | ICD-10-CM

## 2021-01-09 DIAGNOSIS — R0602 Shortness of breath: Secondary | ICD-10-CM

## 2021-01-09 DIAGNOSIS — M0510 Rheumatoid lung disease with rheumatoid arthritis of unspecified site: Secondary | ICD-10-CM

## 2021-01-09 DIAGNOSIS — I77810 Thoracic aortic ectasia: Secondary | ICD-10-CM

## 2021-01-09 NOTE — Patient Instructions (Addendum)
You seem to be doing well from the lung standpoint particularly since your rheumatoid arthritis is better controlled.  The scarring noted in your lungs is related to the rheumatoid arthritis and the management of it is control of the rheumatoid arthritis.  You should have a scan of the chest in April sometime to follow-up on the main vessel in your chest as it is a little dilated.  This can be arranged through Dr. Doy Hutching.  We will see him in follow-up in 6 months time call sooner should any new problems arise.

## 2021-01-09 NOTE — Progress Notes (Signed)
Subjective:    Patient ID: Stephanie Patel, female    DOB: 07-Mar-1945, 76 y.o.   MRN: [id number] Chief Complaint  Patient presents with   Follow-up   PROBLEMS: Rheumatoid arthritis Pulmonary fibrosis - previously on MTX, prior patient of Dr. Sung Amabile   Hospitalized 3/13 - 03/25/15 with acute (?on chronic) hypoxemic respiratory failure and severe pneumonitis pattern on CXR, CT chest. Treated with empiric antibiotics and systemic steroids with gradual improvement. Discharged home on tapering prednisone, home O2.  INTERVAL HISTORY: Last seen by me on 09 May 2020.  No major issues since then.  Dyspnea better.  "Allergy issues.  HPI Stephanie Patel is a 76 year old, former smoker (quit 1978) with rheumatoid arthritis and known rheumatoid lung who presents for scheduled follow-up. She has now been on Humira and methotrexate for management of her rheumatoid arthritis. She notes that her shortness of breath has been stable and as a matter fact continues to get better. She has had in the past mild airways reactivity with chronic bronchitis for which she is on Breo 100/25. She has not been using this regularly but only as she feels that she needs it. However she does note seasonal change exacerbation of symptoms and notes that when she uses the [facility] it is effective.  Worst time seems to be spring and fall.  She is not taking regular allergy medication and is not using her Flonase regularly.  Uses these as needed.  She has not had any fevers, chills or sweats. PFTs have not shown significant change from prior she continues to show mild restrictive physiology CT chest also showed stable changes. Otherwise she feels well and looks well.   DATA: 03/18/15 CT chest: Chronic pulmonary fibrosis, bibasilar predominant, likely related to the given history of rheumatoid arthritis. Diffuse patchy ground-glass opacities throughout both lungs. 05/07/15 CT chest: Persistence of fibrotic changes. Resolution of diffuse  GGOs 05/08/15 PFTs: Mild restriction, mod decrease DLCO. Minimal desaturation on 6 min walk (to 92%) 05/16/15 Overnight oximetry: relatively brief period of desaturation but to a low of 68% 11/13/15 PFTs: no obstruction, mild restriction, DLCO measurement invalid  08/12/16 : 300 meters, SpO2 98>91% 11/24/16 PFTs: No obstruction, lung volumes low-normal, DLCO normal 11/08/16 CTA chest: NSC interstitial prominence  11/25/17 PFTs: FVC: 1.74 L (73 %pred), FEV1: 1.61 L (86 %pred), FEV1/FVC: 93%, TLC: 2.73 L (61 %pred), DLCO 94 %pred.  No change after bronchodilator challenge.  Flow volume curve consistent with mild restriction. Overall minimal change from prior PFTs except in total lung capacity (which might be a spurious finding) 02/14/2020 2D echo: LVEF 60 to 65%.  Wall motion abnormalities.  Mild left ventricular hypertrophy.  Grade I DD.  No evidence of pulmonary hypertension 03/12/2020 PFTs: FEV1 1.44 L or 82% predicted, FVC 1.58 L or 67% predicted FEV1/FVC 91%.  No bronchodilator response.  Consistent with mild restriction.  Diffusion capacity normal.  Overall, no significant/statistical change from prior 05/03/2020 high resolution CT chest: Persistent fibrotic changes, no GGO's.  Minimal to no change from 2018.  Review of Systems A 10 point review of systems was performed and it is as noted above otherwise negative.  Patient Active Problem List   Diagnosis Date Noted   GIB (gastrointestinal bleeding) 09/19/2019   GERD (gastroesophageal reflux disease) 09/19/2019   Hypokalemia 09/19/2019   CAP (community acquired pneumonia) 11/08/2016   Right inguinal hernia 09/29/2016   HTN, goal below 140/80 12/26/2015   ILD (interstitial lung disease) (HCC) 03/28/2015   Acute respiratory failure with hypoxia (  HCC) 03/18/2015   Closed torus fracture of distal end of right fibula 10/03/2013   Depression 08/21/2013   Environmental allergies 08/21/2013   Irritable bowel syndrome 08/21/2013   LVH  (left ventricular hypertrophy) 08/21/2013   Osteoarthritis of knee 08/21/2013   Vitamin D deficiency, unspecified 08/21/2013   Rheumatoid arthritis of multiple sites with negative rheumatoid factor (HCC) 02/10/2012   Social History   Tobacco Use   Smoking status: Former    Packs/day: 0.50    Years: 15.00    Pack years: 7.50    Types: Cigarettes    Quit date: 06/05/1976    Years since quitting: 44.6   Smokeless tobacco: Never  Substance Use Topics   Alcohol use: Yes    Comment: Wine occasional   Allergies  Allergen Reactions   Keflex [Cephalexin] Hives   Peanut-Containing Drug Products Other (See Comments)    Patient had an allergy panel and this was found to be an allergy for the patient   Aricept [Donepezil] Diarrhea   Codeine Nausea And Vomiting and Other (See Comments)    Tolerates with anti-nausea medication ([id number])   Hydrochlorothiazide Other (See Comments)    Leg cramps   Current Meds  Medication Sig   acidophilus (RISAQUAD) CAPS capsule Take 1 capsule by mouth daily.   Adalimumab (HUMIRA PEN) 40 MG/0.4ML PNKT    alendronate (FOSAMAX) 70 MG tablet Take 70 mg by mouth once a week. Take with a full glass of water on an empty stomach.   Ascorbic Acid (VITAMIN C) 1000 MG tablet Take 1,000 mg by mouth daily.   buPROPion (WELLBUTRIN XL) 300 MG 24 hr tablet Take 300 mg by mouth daily.    busPIRone (BUSPAR) 15 MG tablet Take 15 mg by mouth 2 (two) times daily.   calcium carbonate (OSCAL) 1500 (600 Ca) MG TABS tablet Take 600 mg by mouth at bedtime.   desvenlafaxine (PRISTIQ) 50 MG 24 hr tablet Take 50 mg by mouth daily.   donepezil (ARICEPT) 5 MG tablet Take 5 mg by mouth at bedtime.   fluticasone (FLONASE) 50 MCG/ACT nasal spray 1 SPRAY INTO BOTH NOSTRILS EVERY DAY AS NEEDED FOR ALLERGIES   folic acid (FOLVITE) 1 MG tablet Take 1 mg by mouth daily.   inFLIXimab (REMICADE) 100 MG injection Inject into the vein.   leflunomide (ARAVA) 20 MG tablet Take 20 mg by mouth  daily.   levocetirizine (XYZAL) 5 MG tablet Take 5 mg by mouth every evening.   losartan (COZAAR) 50 MG tablet Take 1 tablet (50 mg total) by mouth 2 (two) times daily.   methotrexate (RHEUMATREX) 2.5 MG tablet Take by mouth.   metoprolol succinate (TOPROL-XL) 50 MG 24 hr tablet Take 50 mg by mouth daily. Take with or immediately following a meal.   pantoprazole (PROTONIX) 40 MG tablet Take 40 mg by mouth 2 (two) times daily.    predniSONE (DELTASONE) 5 MG tablet Take 5 mg by mouth daily.   risperiDONE (RISPERDAL) 0.5 MG tablet Take 0.5 mg by mouth at bedtime.   tolterodine (DETROL LA) 4 MG 24 hr capsule Take 4 mg by mouth at bedtime.    [BREO ELLIPTA 100-25 MCG/INH AEPB INHALE 1 PUFF INTO THE LUNGS DAILY AS DIRECTED (uses as needed)   Immunization History  Administered Date(s) Administered   Influenza Split 10/01/2014, 10/14/2015   Influenza, High Dose Seasonal PF 09/03/2016, 10/05/2017, 09/26/2018   Influenza-Unspecified 09/26/2012, 12/22/2015, 09/28/2019   Moderna Sars-Covid-2 Vaccination 03/01/2019, 03/29/2019   Pneumococcal Conjugate-13 03/10/2014, 01/11/2017  Typhoid Inactivated 02/10/2012   Zoster Recombinat (Shingrix) 09/23/2016, 01/20/2017, 06/20/2018   Zoster, Unspecified 09/23/2016        Objective:   Physical Exam BP 126/70 (BP Location: Left Arm, Patient Position: Sitting, Cuff Size: Normal)   Pulse 84   Temp 97.7 F (36.5 C) (Oral)   Ht 5' (1.524 m)   Wt 125 lb 3.2 oz (56.8 kg)   SpO2 97%   BMI 24.45 kg/m   GENERAL: Well-developed, well-nourished, no acute distress.  Fully ambulatory.  No conversational dyspnea. HEAD: Normocephalic, atraumatic.  EYES: Pupils equal, round, reactive to light.  No scleral icterus.  MOUTH: Nose/mouth/throat not examined due to masking requirements for COVID 19. NECK: Supple. No thyromegaly. Trachea midline. No JVD.  No adenopathy. PULMONARY: Good air entry bilaterally.  Mild basilar crackles otherwise no adventitious  sounds. CARDIOVASCULAR: S1 and S2. Regular rate and rhythm.  No rubs, murmurs or gallops heard. ABDOMEN: Benign. MUSCULOSKELETAL: Bilateral changes of rheumatoid arthritis of hands feet, no clubbing, no edema.  NEUROLOGIC: No focal deficit, no gait disturbance, speech is fluent. SKIN: Intact,warm,dry.  On limited exam no rashes. PSYCH: Mood and behavior normal.     Assessment & Plan:     ICD-10-CM   1. Rheumatoid lung (HCC)  M05.10    Appears stable Management is treatment of RA She is on Humira and methotrexate    2. Shortness of breath  R06.02    Continues to improve Encouraged to stay active    3. Chronic allergic bronchitis  J45.998    Responds to Breo Ellipta Has chronic/recurrent pattern Continue as needed albuterol as well    4. 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  We will see her in follow-up in 6 months time she is to contact us prior to that time should any new difficulties arise.  Stephanie Shelter, MD Advanced Bronchoscopy PCCM [facility]    *This note was dictated using voice recognition software/Dragon.  Despite best efforts to proofread, errors can occur which can change the meaning. Any transcriptional errors that result from this process are unintentional and may not be fully corrected at the time of dictation.

## 2021-01-21 ENCOUNTER — Encounter: Payer: Self-pay | Admitting: Physical Therapy

## 2021-01-21 ENCOUNTER — Ambulatory Visit: Payer: Medicare PPO | Attending: Internal Medicine | Admitting: Physical Therapy

## 2021-01-21 DIAGNOSIS — M25561 Pain in right knee: Secondary | ICD-10-CM | POA: Insufficient documentation

## 2021-01-21 DIAGNOSIS — M6281 Muscle weakness (generalized): Secondary | ICD-10-CM

## 2021-01-21 DIAGNOSIS — R296 Repeated falls: Secondary | ICD-10-CM

## 2021-01-21 DIAGNOSIS — G8929 Other chronic pain: Secondary | ICD-10-CM | POA: Diagnosis present

## 2021-01-21 DIAGNOSIS — R2681 Unsteadiness on feet: Secondary | ICD-10-CM | POA: Diagnosis present

## 2021-01-21 DIAGNOSIS — R262 Difficulty in walking, not elsewhere classified: Secondary | ICD-10-CM
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
## 2021-01-23 ENCOUNTER — Encounter: Payer: Self-pay | Admitting: Physical Therapy

## 2021-01-23 ENCOUNTER — Ambulatory Visit: Payer: Medicare PPO | Admitting: Physical Therapy

## 2021-01-23 DIAGNOSIS — R296 Repeated falls: Secondary | ICD-10-CM

## 2021-01-23 DIAGNOSIS — G8929 Other chronic pain: Secondary | ICD-10-CM

## 2021-01-23 DIAGNOSIS — R2681 Unsteadiness on feet: Secondary | ICD-10-CM

## 2021-01-23 DIAGNOSIS — R262 Difficulty in walking, not elsewhere classified: Secondary | ICD-10-CM

## 2021-01-23 DIAGNOSIS — M6281 Muscle weakness (generalized): Secondary | ICD-10-CM
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
## 2021-01-28 ENCOUNTER — Ambulatory Visit: Payer: Medicare PPO | Admitting: Physical Therapy

## 2021-01-28 ENCOUNTER — Encounter: Payer: Medicare Other | Admitting: Physical Therapy

## 2021-01-29 ENCOUNTER — Other Ambulatory Visit: Payer: Self-pay

## 2021-01-29 ENCOUNTER — Ambulatory Visit: Payer: Medicare PPO | Admitting: Physical Therapy

## 2021-01-29 DIAGNOSIS — M6281 Muscle weakness (generalized): Secondary | ICD-10-CM

## 2021-01-29 DIAGNOSIS — R2681 Unsteadiness on feet: Secondary | ICD-10-CM | POA: Diagnosis not present

## 2021-01-29 DIAGNOSIS — R296 Repeated falls: Secondary | ICD-10-CM

## 2021-01-29 DIAGNOSIS — R262 Difficulty in walking, not elsewhere classified: Secondary | ICD-10-CM

## 2021-01-29 DIAGNOSIS — G8929 Other chronic pain: Secondary | ICD-10-CM
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
## 2021-02-03 ENCOUNTER — Ambulatory Visit: Payer: Medicare PPO | Admitting: Physical Therapy

## 2021-02-04 ENCOUNTER — Ambulatory Visit: Payer: Medicare PPO | Admitting: Physical Therapy

## 2021-02-06 ENCOUNTER — Other Ambulatory Visit: Payer: Self-pay

## 2021-02-06 ENCOUNTER — Ambulatory Visit: Payer: Medicare PPO | Attending: Internal Medicine | Admitting: Physical Therapy

## 2021-02-06 ENCOUNTER — Encounter: Payer: Self-pay | Admitting: Physical Therapy

## 2021-02-06 DIAGNOSIS — G8929 Other chronic pain: Secondary | ICD-10-CM | POA: Diagnosis present

## 2021-02-06 DIAGNOSIS — M25561 Pain in right knee: Secondary | ICD-10-CM | POA: Diagnosis present

## 2021-02-06 DIAGNOSIS — R2681 Unsteadiness on feet: Secondary | ICD-10-CM | POA: Diagnosis not present

## 2021-02-06 DIAGNOSIS — R262 Difficulty in walking, not elsewhere classified: Secondary | ICD-10-CM | POA: Insufficient documentation

## 2021-02-06 DIAGNOSIS — M6281 Muscle weakness (generalized): Secondary | ICD-10-CM | POA: Diagnosis present

## 2021-02-06 DIAGNOSIS — R296 Repeated falls: Secondary | ICD-10-CM | POA: Insufficient documentation
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
## 2021-02-10 ENCOUNTER — Ambulatory Visit: Payer: Medicare PPO | Admitting: Physical Therapy

## 2021-02-10 ENCOUNTER — Encounter: Payer: Self-pay | Admitting: Physical Therapy

## 2021-02-10 ENCOUNTER — Other Ambulatory Visit: Payer: Self-pay

## 2021-02-10 DIAGNOSIS — M6281 Muscle weakness (generalized): Secondary | ICD-10-CM

## 2021-02-10 DIAGNOSIS — G8929 Other chronic pain: Secondary | ICD-10-CM

## 2021-02-10 DIAGNOSIS — R2681 Unsteadiness on feet: Secondary | ICD-10-CM | POA: Diagnosis not present

## 2021-02-10 DIAGNOSIS — R262 Difficulty in walking, not elsewhere classified: Secondary | ICD-10-CM

## 2021-02-10 DIAGNOSIS — R296 Repeated falls: Secondary | ICD-10-CM
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
## 2021-02-11 ENCOUNTER — Encounter: Payer: Medicare Other | Admitting: Physical Therapy

## 2021-02-13 ENCOUNTER — Ambulatory Visit: Payer: Medicare PPO | Admitting: Physical Therapy

## 2021-02-13 ENCOUNTER — Other Ambulatory Visit: Payer: Self-pay

## 2021-02-13 ENCOUNTER — Encounter: Payer: Self-pay | Admitting: Physical Therapy

## 2021-02-13 DIAGNOSIS — R262 Difficulty in walking, not elsewhere classified: Secondary | ICD-10-CM

## 2021-02-13 DIAGNOSIS — R2681 Unsteadiness on feet: Secondary | ICD-10-CM

## 2021-02-13 DIAGNOSIS — M25561 Pain in right knee: Secondary | ICD-10-CM

## 2021-02-13 DIAGNOSIS — R296 Repeated falls: Secondary | ICD-10-CM

## 2021-02-13 DIAGNOSIS — M6281 Muscle weakness (generalized): Secondary | ICD-10-CM

## 2021-02-13 DIAGNOSIS — G8929 Other chronic pain: Secondary | ICD-10-CM
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
## 2021-02-17 ENCOUNTER — Other Ambulatory Visit: Payer: Self-pay

## 2021-02-17 ENCOUNTER — Encounter: Payer: Self-pay | Admitting: Physical Therapy

## 2021-02-17 ENCOUNTER — Ambulatory Visit: Payer: Medicare PPO | Admitting: Physical Therapy

## 2021-02-17 DIAGNOSIS — R2681 Unsteadiness on feet: Secondary | ICD-10-CM | POA: Diagnosis not present

## 2021-02-17 DIAGNOSIS — G8929 Other chronic pain: Secondary | ICD-10-CM

## 2021-02-17 DIAGNOSIS — M6281 Muscle weakness (generalized): Secondary | ICD-10-CM

## 2021-02-17 DIAGNOSIS — R262 Difficulty in walking, not elsewhere classified: Secondary | ICD-10-CM

## 2021-02-17 DIAGNOSIS — R296 Repeated falls: Secondary | ICD-10-CM
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
## 2021-02-19 ENCOUNTER — Ambulatory Visit: Payer: Medicare PPO | Admitting: Physical Therapy

## 2021-02-24 ENCOUNTER — Other Ambulatory Visit: Payer: Self-pay

## 2021-02-24 ENCOUNTER — Encounter: Payer: Self-pay | Admitting: Physical Therapy

## 2021-02-24 ENCOUNTER — Ambulatory Visit: Payer: Medicare PPO | Admitting: Physical Therapy

## 2021-02-24 DIAGNOSIS — G8929 Other chronic pain: Secondary | ICD-10-CM

## 2021-02-24 DIAGNOSIS — R262 Difficulty in walking, not elsewhere classified: Secondary | ICD-10-CM

## 2021-02-24 DIAGNOSIS — M6281 Muscle weakness (generalized): Secondary | ICD-10-CM

## 2021-02-24 DIAGNOSIS — R2681 Unsteadiness on feet: Secondary | ICD-10-CM | POA: Diagnosis not present

## 2021-02-24 DIAGNOSIS — R296 Repeated falls: Secondary | ICD-10-CM
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
## 2021-02-27 ENCOUNTER — Encounter: Payer: Self-pay | Admitting: Physical Therapy

## 2021-02-27 ENCOUNTER — Other Ambulatory Visit: Payer: Self-pay

## 2021-02-27 ENCOUNTER — Ambulatory Visit: Payer: Medicare PPO | Admitting: Physical Therapy

## 2021-02-27 DIAGNOSIS — R2681 Unsteadiness on feet: Secondary | ICD-10-CM | POA: Diagnosis not present

## 2021-02-27 DIAGNOSIS — M6281 Muscle weakness (generalized): Secondary | ICD-10-CM

## 2021-02-27 DIAGNOSIS — R262 Difficulty in walking, not elsewhere classified: Secondary | ICD-10-CM

## 2021-02-27 DIAGNOSIS — R296 Repeated falls: Secondary | ICD-10-CM

## 2021-02-27 DIAGNOSIS — G8929 Other chronic pain: Secondary | ICD-10-CM
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
## 2021-03-03 ENCOUNTER — Encounter: Payer: Self-pay | Admitting: Physical Therapy

## 2021-03-03 ENCOUNTER — Ambulatory Visit: Payer: Medicare PPO | Admitting: Physical Therapy

## 2021-03-03 ENCOUNTER — Other Ambulatory Visit: Payer: Self-pay

## 2021-03-03 DIAGNOSIS — R262 Difficulty in walking, not elsewhere classified: Secondary | ICD-10-CM

## 2021-03-03 DIAGNOSIS — R296 Repeated falls: Secondary | ICD-10-CM

## 2021-03-03 DIAGNOSIS — R2681 Unsteadiness on feet: Secondary | ICD-10-CM

## 2021-03-03 DIAGNOSIS — M6281 Muscle weakness (generalized): Secondary | ICD-10-CM

## 2021-03-03 DIAGNOSIS — G8929 Other chronic pain: Secondary | ICD-10-CM
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
## 2021-03-05 ENCOUNTER — Ambulatory Visit: Payer: Medicare PPO | Admitting: Physical Therapy

## 2021-03-11 ENCOUNTER — Ambulatory Visit: Payer: Medicare PPO | Admitting: Physical Therapy

## 2021-03-11 ENCOUNTER — Other Ambulatory Visit: Payer: Self-pay | Admitting: Internal Medicine

## 2021-03-11 DIAGNOSIS — Z1231 Encounter for screening mammogram for malignant neoplasm of breast: Secondary | ICD-10-CM

## 2021-03-18 ENCOUNTER — Ambulatory Visit: Payer: Medicare PPO | Attending: Internal Medicine | Admitting: Physical Therapy

## 2021-03-18 ENCOUNTER — Ambulatory Visit: Payer: Medicare PPO | Admitting: Physical Therapy

## 2021-03-18 ENCOUNTER — Encounter: Payer: Self-pay | Admitting: Physical Therapy

## 2021-03-18 ENCOUNTER — Other Ambulatory Visit: Payer: Self-pay

## 2021-03-18 DIAGNOSIS — R262 Difficulty in walking, not elsewhere classified: Secondary | ICD-10-CM | POA: Insufficient documentation

## 2021-03-18 DIAGNOSIS — R296 Repeated falls: Secondary | ICD-10-CM | POA: Insufficient documentation

## 2021-03-18 DIAGNOSIS — M25561 Pain in right knee: Secondary | ICD-10-CM | POA: Insufficient documentation

## 2021-03-18 DIAGNOSIS — R2681 Unsteadiness on feet: Secondary | ICD-10-CM | POA: Diagnosis present

## 2021-03-18 DIAGNOSIS — G8929 Other chronic pain: Secondary | ICD-10-CM | POA: Diagnosis present

## 2021-03-18 DIAGNOSIS — M6281 Muscle weakness (generalized): Secondary | ICD-10-CM | POA: Diagnosis present
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
## 2021-03-19 ENCOUNTER — Encounter: Payer: Medicare PPO | Admitting: Physical Therapy

## 2021-03-20 ENCOUNTER — Encounter: Payer: Self-pay | Admitting: Physical Therapy

## 2021-03-20 ENCOUNTER — Ambulatory Visit: Payer: Medicare PPO | Admitting: Physical Therapy

## 2021-03-20 ENCOUNTER — Other Ambulatory Visit: Payer: Self-pay

## 2021-03-20 DIAGNOSIS — R2681 Unsteadiness on feet: Secondary | ICD-10-CM | POA: Diagnosis not present
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
## 2021-03-25 ENCOUNTER — Ambulatory Visit: Payer: Medicare PPO | Admitting: Physical Therapy

## 2021-03-26 ENCOUNTER — Ambulatory Visit: Payer: Medicare PPO | Admitting: Physical Therapy

## 2021-03-26 ENCOUNTER — Encounter: Payer: Self-pay | Admitting: Physical Therapy

## 2021-03-26 ENCOUNTER — Other Ambulatory Visit: Payer: Self-pay

## 2021-03-26 DIAGNOSIS — G8929 Other chronic pain: Secondary | ICD-10-CM

## 2021-03-26 DIAGNOSIS — R296 Repeated falls: Secondary | ICD-10-CM

## 2021-03-26 DIAGNOSIS — M6281 Muscle weakness (generalized): Secondary | ICD-10-CM

## 2021-03-26 DIAGNOSIS — R2681 Unsteadiness on feet: Secondary | ICD-10-CM

## 2021-03-26 DIAGNOSIS — R262 Difficulty in walking, not elsewhere classified: Secondary | ICD-10-CM
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
## 2021-03-27 ENCOUNTER — Ambulatory Visit: Payer: Medicare PPO | Admitting: Physical Therapy

## 2021-04-01 ENCOUNTER — Encounter: Payer: Self-pay | Admitting: Physical Therapy

## 2021-04-01 ENCOUNTER — Other Ambulatory Visit: Payer: Self-pay

## 2021-04-01 ENCOUNTER — Ambulatory Visit: Payer: Medicare PPO | Admitting: Physical Therapy

## 2021-04-01 VITALS — BP 114/73 | HR 58

## 2021-04-01 DIAGNOSIS — R296 Repeated falls: Secondary | ICD-10-CM

## 2021-04-01 DIAGNOSIS — R262 Difficulty in walking, not elsewhere classified: Secondary | ICD-10-CM

## 2021-04-01 DIAGNOSIS — R2681 Unsteadiness on feet: Secondary | ICD-10-CM

## 2021-04-01 DIAGNOSIS — M6281 Muscle weakness (generalized): Secondary | ICD-10-CM

## 2021-04-01 DIAGNOSIS — G8929 Other chronic pain: Secondary | ICD-10-CM
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
## 2021-04-03 ENCOUNTER — Encounter: Payer: Self-pay | Admitting: Physical Therapy

## 2021-04-03 ENCOUNTER — Ambulatory Visit: Payer: Medicare PPO | Admitting: Physical Therapy

## 2021-04-03 DIAGNOSIS — R262 Difficulty in walking, not elsewhere classified: Secondary | ICD-10-CM

## 2021-04-03 DIAGNOSIS — G8929 Other chronic pain: Secondary | ICD-10-CM

## 2021-04-03 DIAGNOSIS — R296 Repeated falls: Secondary | ICD-10-CM

## 2021-04-03 DIAGNOSIS — R2681 Unsteadiness on feet: Secondary | ICD-10-CM | POA: Diagnosis not present

## 2021-04-03 DIAGNOSIS — M6281 Muscle weakness (generalized): Secondary | ICD-10-CM
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
## 2021-04-07 ENCOUNTER — Encounter: Payer: Self-pay | Admitting: Physical Therapy

## 2021-04-07 ENCOUNTER — Ambulatory Visit: Payer: Medicare PPO | Attending: Internal Medicine | Admitting: Physical Therapy

## 2021-04-07 DIAGNOSIS — M25561 Pain in right knee: Secondary | ICD-10-CM | POA: Insufficient documentation

## 2021-04-07 DIAGNOSIS — M6281 Muscle weakness (generalized): Secondary | ICD-10-CM | POA: Insufficient documentation

## 2021-04-07 DIAGNOSIS — R262 Difficulty in walking, not elsewhere classified: Secondary | ICD-10-CM | POA: Insufficient documentation

## 2021-04-07 DIAGNOSIS — R296 Repeated falls: Secondary | ICD-10-CM | POA: Insufficient documentation

## 2021-04-07 DIAGNOSIS — G8929 Other chronic pain: Secondary | ICD-10-CM | POA: Insufficient documentation

## 2021-04-07 DIAGNOSIS — R2681 Unsteadiness on feet: Secondary | ICD-10-CM | POA: Insufficient documentation
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
## 2021-04-09 ENCOUNTER — Ambulatory Visit: Payer: Medicare PPO | Admitting: Physical Therapy

## 2021-04-09 ENCOUNTER — Telehealth: Payer: Self-pay | Admitting: Physical Therapy

## 2021-04-09 NOTE — Telephone Encounter (Signed)
Called patient after she did not come to her appointment this afternoon scheduled at 1:45pm. She said she is not feeling well today due to gastrointestinal problems and she apologized she did not call in to tell us she was not coming. Verified next scheduled visit on Monday 04/14/2021 at 1pm.  ? ?Stephanie Patel. Graylon Good, PT, DPT ?04/09/21, 2:15 PM ? ?[place] ?[address] ?[place], [place] [address] ?P: [phone] I F: [phone] ? ?

## 2021-04-14 ENCOUNTER — Ambulatory Visit: Payer: Medicare PPO | Admitting: Physical Therapy

## 2021-04-16 ENCOUNTER — Ambulatory Visit: Payer: Medicare PPO | Admitting: Physical Therapy

## 2021-04-21 ENCOUNTER — Ambulatory Visit: Payer: Medicare PPO | Admitting: Physical Therapy

## 2021-04-23 ENCOUNTER — Encounter: Payer: TRICARE For Life (TFL) | Admitting: Physical Therapy

## 2021-04-24 ENCOUNTER — Ambulatory Visit: Payer: Medicare PPO | Admitting: Physical Therapy

## 2021-04-24 ENCOUNTER — Encounter: Payer: Self-pay | Admitting: Physical Therapy

## 2021-04-24 DIAGNOSIS — R2681 Unsteadiness on feet: Secondary | ICD-10-CM

## 2021-04-24 DIAGNOSIS — G8929 Other chronic pain: Secondary | ICD-10-CM

## 2021-04-24 DIAGNOSIS — R262 Difficulty in walking, not elsewhere classified: Secondary | ICD-10-CM

## 2021-04-24 DIAGNOSIS — R296 Repeated falls: Secondary | ICD-10-CM

## 2021-04-24 DIAGNOSIS — M6281 Muscle weakness (generalized): Secondary | ICD-10-CM
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
## 2021-04-28 ENCOUNTER — Encounter: Payer: TRICARE For Life (TFL) | Admitting: Physical Therapy

## 2021-04-30 ENCOUNTER — Encounter: Payer: Self-pay | Admitting: Physical Therapy

## 2021-04-30 ENCOUNTER — Ambulatory Visit: Payer: Medicare PPO | Admitting: Physical Therapy

## 2021-04-30 DIAGNOSIS — G8929 Other chronic pain: Secondary | ICD-10-CM

## 2021-04-30 DIAGNOSIS — R2681 Unsteadiness on feet: Secondary | ICD-10-CM | POA: Diagnosis not present

## 2021-04-30 DIAGNOSIS — M6281 Muscle weakness (generalized): Secondary | ICD-10-CM

## 2021-04-30 DIAGNOSIS — R296 Repeated falls: Secondary | ICD-10-CM

## 2021-04-30 DIAGNOSIS — R262 Difficulty in walking, not elsewhere classified: Secondary | ICD-10-CM
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
## 2021-05-01 ENCOUNTER — Telehealth: Payer: Self-pay

## 2021-05-01 ENCOUNTER — Ambulatory Visit: Payer: Medicare PPO | Admitting: Physical Therapy

## 2021-05-01 NOTE — Telephone Encounter (Signed)
Pt did not show for scheduled appointment today. Chief Strategy Officer used a telephonic device in order to establish verbal communication. Pt reports a complication this date of her dental work performed previous day which required an unplanned return to office of dentistry for follow up. Author confirms next scheduled appointment with pt. ? ?3:45 PM, 05/01/21 ?Etta Grandchild, PT, DPT ?Physical Therapist - [place] ?[phone] (Office) ? ?

## 2021-05-05 NOTE — Addendum Note (Signed)
Addended by: Rosita Kea R on: 05/05/2021 10:14 AM ? ? Modules accepted: Orders ? ?

## 2021-05-06 ENCOUNTER — Ambulatory Visit: Payer: Medicare PPO | Admitting: Physical Therapy

## 2021-05-08 ENCOUNTER — Encounter: Payer: Self-pay | Admitting: Physical Therapy

## 2021-05-08 ENCOUNTER — Ambulatory Visit: Payer: Medicare PPO | Attending: Internal Medicine | Admitting: Physical Therapy

## 2021-05-08 DIAGNOSIS — M6281 Muscle weakness (generalized): Secondary | ICD-10-CM | POA: Diagnosis present

## 2021-05-08 DIAGNOSIS — R296 Repeated falls: Secondary | ICD-10-CM | POA: Insufficient documentation

## 2021-05-08 DIAGNOSIS — R262 Difficulty in walking, not elsewhere classified: Secondary | ICD-10-CM | POA: Diagnosis present

## 2021-05-08 DIAGNOSIS — M25561 Pain in right knee: Secondary | ICD-10-CM | POA: Insufficient documentation

## 2021-05-08 DIAGNOSIS — R2681 Unsteadiness on feet: Secondary | ICD-10-CM | POA: Diagnosis present

## 2021-05-08 DIAGNOSIS — G8929 Other chronic pain: Secondary | ICD-10-CM | POA: Insufficient documentation
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
## 2021-05-13 ENCOUNTER — Ambulatory Visit: Payer: Medicare PPO | Admitting: Physical Therapy

## 2021-05-13 ENCOUNTER — Encounter: Payer: Self-pay | Admitting: Physical Therapy

## 2021-05-13 DIAGNOSIS — R2681 Unsteadiness on feet: Secondary | ICD-10-CM | POA: Diagnosis not present

## 2021-05-13 DIAGNOSIS — M6281 Muscle weakness (generalized): Secondary | ICD-10-CM

## 2021-05-13 DIAGNOSIS — G8929 Other chronic pain: Secondary | ICD-10-CM

## 2021-05-13 DIAGNOSIS — R296 Repeated falls: Secondary | ICD-10-CM

## 2021-05-13 DIAGNOSIS — R262 Difficulty in walking, not elsewhere classified: Secondary | ICD-10-CM
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
## 2021-05-15 ENCOUNTER — Ambulatory Visit: Payer: Medicare PPO | Admitting: Physical Therapy
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
## 2021-05-20 ENCOUNTER — Ambulatory Visit: Payer: Medicare PPO | Admitting: Physical Therapy

## 2021-05-20 ENCOUNTER — Encounter: Payer: Self-pay | Admitting: Physical Therapy

## 2021-05-20 DIAGNOSIS — R2681 Unsteadiness on feet: Secondary | ICD-10-CM | POA: Diagnosis not present

## 2021-05-20 DIAGNOSIS — M6281 Muscle weakness (generalized): Secondary | ICD-10-CM

## 2021-05-20 DIAGNOSIS — G8929 Other chronic pain: Secondary | ICD-10-CM

## 2021-05-20 DIAGNOSIS — R262 Difficulty in walking, not elsewhere classified: Secondary | ICD-10-CM

## 2021-05-20 DIAGNOSIS — R296 Repeated falls: Secondary | ICD-10-CM
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
## 2021-05-21 ENCOUNTER — Ambulatory Visit: Payer: Medicare PPO | Admitting: Physical Therapy
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
## 2021-05-27 ENCOUNTER — Encounter: Payer: Self-pay | Admitting: Physical Therapy

## 2021-05-27 ENCOUNTER — Ambulatory Visit: Payer: Medicare PPO | Admitting: Physical Therapy

## 2021-05-27 DIAGNOSIS — M6281 Muscle weakness (generalized): Secondary | ICD-10-CM

## 2021-05-27 DIAGNOSIS — R2681 Unsteadiness on feet: Secondary | ICD-10-CM | POA: Diagnosis not present

## 2021-05-27 DIAGNOSIS — R262 Difficulty in walking, not elsewhere classified: Secondary | ICD-10-CM

## 2021-05-27 DIAGNOSIS — R296 Repeated falls: Secondary | ICD-10-CM

## 2021-05-27 DIAGNOSIS — G8929 Other chronic pain: Secondary | ICD-10-CM

## 2021-06-02 NOTE — Addendum Note (Signed)
Encounter addended by: Annie Paras on: 06/02/2021 10:37 AM  Actions taken: Letter saved

## 2021-06-05 ENCOUNTER — Telehealth: Payer: Self-pay | Admitting: Physical Therapy

## 2021-06-05 ENCOUNTER — Ambulatory Visit: Payer: Medicare PPO | Attending: Internal Medicine | Admitting: Physical Therapy

## 2021-06-05 DIAGNOSIS — G8929 Other chronic pain: Secondary | ICD-10-CM | POA: Insufficient documentation

## 2021-06-05 DIAGNOSIS — M6281 Muscle weakness (generalized): Secondary | ICD-10-CM | POA: Insufficient documentation

## 2021-06-05 DIAGNOSIS — M25561 Pain in right knee: Secondary | ICD-10-CM | POA: Insufficient documentation

## 2021-06-05 DIAGNOSIS — R296 Repeated falls: Secondary | ICD-10-CM | POA: Insufficient documentation

## 2021-06-05 DIAGNOSIS — R262 Difficulty in walking, not elsewhere classified: Secondary | ICD-10-CM | POA: Insufficient documentation

## 2021-06-05 DIAGNOSIS — R2681 Unsteadiness on feet: Secondary | ICD-10-CM | POA: Insufficient documentation

## 2021-06-05 NOTE — Telephone Encounter (Signed)
Called patient when she did not show up for her appointment scheduled at 2:30pm today. Left VM at home and cell number requesting call back.   Everlean Alstrom. Graylon Good, PT, DPT 06/05/21, 3:01 PM  [facility] [address] [place], [place] [address] P: [phone] I F: [phone]
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
## 2021-06-10 ENCOUNTER — Ambulatory Visit: Payer: TRICARE For Life (TFL) | Admitting: Physical Therapy
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
## 2021-06-12 ENCOUNTER — Encounter: Payer: Self-pay | Admitting: Physical Therapy

## 2021-06-12 ENCOUNTER — Ambulatory Visit: Payer: Medicare PPO | Admitting: Physical Therapy

## 2021-06-12 DIAGNOSIS — R262 Difficulty in walking, not elsewhere classified: Secondary | ICD-10-CM

## 2021-06-12 DIAGNOSIS — M25561 Pain in right knee: Secondary | ICD-10-CM | POA: Diagnosis present

## 2021-06-12 DIAGNOSIS — G8929 Other chronic pain: Secondary | ICD-10-CM

## 2021-06-12 DIAGNOSIS — M6281 Muscle weakness (generalized): Secondary | ICD-10-CM | POA: Diagnosis present

## 2021-06-12 DIAGNOSIS — R296 Repeated falls: Secondary | ICD-10-CM | POA: Diagnosis present

## 2021-06-12 DIAGNOSIS — R2681 Unsteadiness on feet: Secondary | ICD-10-CM | POA: Diagnosis present

## 2021-06-17 ENCOUNTER — Ambulatory Visit: Payer: Medicare PPO | Admitting: Physical Therapy

## 2021-06-18 ENCOUNTER — Telehealth: Payer: Self-pay | Admitting: Physical Therapy

## 2021-06-18 NOTE — Telephone Encounter (Signed)
Called patient back as requested. Patient states she missed her appointment this week because she fell again in the [facility] parking lot and hurt her right ankle. She did not see the doctor because she could tell it was not broken and she did not have deep bruising in her ribs that would alert her to internal bleeding. She confirmed her next appointment scheduled for Tuesday 06/24/21 at 4pm and stated she plans to bring her walker so PT can help her learn to use it properly. PT agreed.   Everlean Alstrom. Graylon Good, PT, DPT 06/18/21, 8:44 PM  [facility] [address] [place], [place] [address] P: [phone] I F: [phone]

## 2021-06-19 ENCOUNTER — Encounter: Payer: TRICARE For Life (TFL) | Admitting: Physical Therapy

## 2021-06-24 ENCOUNTER — Ambulatory Visit: Payer: Medicare PPO | Admitting: Physical Therapy

## 2021-06-24 ENCOUNTER — Ambulatory Visit
Admission: RE | Admit: 2021-06-24 | Discharge: 2021-06-24 | Disposition: A | Discharge status: Home / Self Care | Payer: Medicare PPO | Source: Ambulatory Visit | Attending: Internal Medicine | Admitting: Internal Medicine

## 2021-06-24 DIAGNOSIS — Z1231 Encounter for screening mammogram for malignant neoplasm of breast: Secondary | ICD-10-CM | POA: Diagnosis not present

## 2021-06-26 ENCOUNTER — Ambulatory Visit: Payer: Medicare PPO | Admitting: Physical Therapy

## 2021-06-26 ENCOUNTER — Other Ambulatory Visit: Payer: Self-pay | Admitting: Internal Medicine

## 2021-06-26 DIAGNOSIS — N6489 Other specified disorders of breast: Secondary | ICD-10-CM

## 2021-06-26 DIAGNOSIS — R928 Other abnormal and inconclusive findings on diagnostic imaging of breast: Secondary | ICD-10-CM

## 2021-07-01 ENCOUNTER — Ambulatory Visit: Payer: Medicare PPO | Admitting: Physical Therapy

## 2021-07-03 ENCOUNTER — Ambulatory Visit: Payer: Medicare PPO | Admitting: Physical Therapy

## 2021-07-07 ENCOUNTER — Telehealth: Payer: Self-pay | Admitting: Physical Therapy

## 2021-07-07 ENCOUNTER — Ambulatory Visit: Payer: Medicare PPO | Attending: Internal Medicine | Admitting: Physical Therapy

## 2021-07-07 NOTE — Telephone Encounter (Signed)
Called patient when she did not come to her PT appointment scheduled for 4pm today. Patient answered and said she forgot about it since she has had company. She states she is not really doing okay but she confirmed her next PT appointment scheduled Thursday 07/10/2021 at [facility]. Graylon Good, PT, DPT 07/07/21, 4:27 PM  [facility] [address] [place], [place] [address] P: [phone] I F: [phone]

## 2021-07-10 ENCOUNTER — Ambulatory Visit: Payer: Medicare PPO | Admitting: Physical Therapy

## 2021-07-10 ENCOUNTER — Telehealth: Payer: Self-pay | Admitting: Physical Therapy

## 2021-07-10 NOTE — Telephone Encounter (Signed)
Called patient back as requested. She states she would like to discontinue PT at this time. She states she has all of her handouts and instructions to continue her HEP. She states her mammogram did not come back normal and she needs additional testing.   Everlean Alstrom. Graylon Good, PT, DPT 07/10/21, 4:37 PM  [facility] [address] [place], [place] [address] P: [phone] I F: [phone]

## 2021-07-11 ENCOUNTER — Ambulatory Visit
Admission: RE | Admit: 2021-07-11 | Discharge: 2021-07-11 | Disposition: A | Discharge status: Home / Self Care | Payer: Medicare PPO | Source: Ambulatory Visit | Attending: Internal Medicine | Admitting: Internal Medicine

## 2021-07-11 DIAGNOSIS — N6489 Other specified disorders of breast: Secondary | ICD-10-CM | POA: Diagnosis present

## 2021-07-11 DIAGNOSIS — R928 Other abnormal and inconclusive findings on diagnostic imaging of breast: Secondary | ICD-10-CM | POA: Insufficient documentation

## 2021-08-11 ENCOUNTER — Observation Stay
Admission: EM | Admit: 2021-08-11 | Discharge: 2021-08-15 | Disposition: A | Discharge status: Skilled Nursing Facility | Payer: Medicare PPO | Attending: Internal Medicine | Admitting: Internal Medicine

## 2021-08-11 ENCOUNTER — Other Ambulatory Visit: Payer: Self-pay

## 2021-08-11 ENCOUNTER — Emergency Department: Payer: Medicare PPO

## 2021-08-11 DIAGNOSIS — J849 Interstitial pulmonary disease, unspecified: Secondary | ICD-10-CM | POA: Diagnosis present

## 2021-08-11 DIAGNOSIS — S32501A Unspecified fracture of right pubis, initial encounter for closed fracture: Principal | ICD-10-CM | POA: Insufficient documentation

## 2021-08-11 DIAGNOSIS — Z23 Encounter for immunization: Secondary | ICD-10-CM | POA: Diagnosis not present

## 2021-08-11 DIAGNOSIS — K589 Irritable bowel syndrome without diarrhea: Secondary | ICD-10-CM | POA: Diagnosis present

## 2021-08-11 DIAGNOSIS — Z85828 Personal history of other malignant neoplasm of skin: Secondary | ICD-10-CM | POA: Diagnosis not present

## 2021-08-11 DIAGNOSIS — S0990XA Unspecified injury of head, initial encounter: Secondary | ICD-10-CM | POA: Diagnosis not present

## 2021-08-11 DIAGNOSIS — Z87891 Personal history of nicotine dependence: Secondary | ICD-10-CM | POA: Diagnosis not present

## 2021-08-11 DIAGNOSIS — I499 Cardiac arrhythmia, unspecified: Secondary | ICD-10-CM | POA: Clinically undetermined

## 2021-08-11 DIAGNOSIS — Z9181 History of falling: Secondary | ICD-10-CM | POA: Insufficient documentation

## 2021-08-11 DIAGNOSIS — F32A Depression, unspecified: Secondary | ICD-10-CM | POA: Diagnosis present

## 2021-08-11 DIAGNOSIS — I10 Essential (primary) hypertension: Secondary | ICD-10-CM | POA: Diagnosis present

## 2021-08-11 DIAGNOSIS — K219 Gastro-esophageal reflux disease without esophagitis: Secondary | ICD-10-CM | POA: Diagnosis present

## 2021-08-11 DIAGNOSIS — W19XXXA Unspecified fall, initial encounter: Secondary | ICD-10-CM | POA: Diagnosis not present

## 2021-08-11 DIAGNOSIS — S329XXA Fracture of unspecified parts of lumbosacral spine and pelvis, initial encounter for closed fracture: Secondary | ICD-10-CM | POA: Insufficient documentation

## 2021-08-11 DIAGNOSIS — R2681 Unsteadiness on feet: Secondary | ICD-10-CM | POA: Diagnosis not present

## 2021-08-11 DIAGNOSIS — S32511A Fracture of superior rim of right pubis, initial encounter for closed fracture: Secondary | ICD-10-CM

## 2021-08-11 DIAGNOSIS — Y92002 Bathroom of unspecified non-institutional (private) residence single-family (private) house as the place of occurrence of the external cause: Secondary | ICD-10-CM | POA: Insufficient documentation

## 2021-08-11 DIAGNOSIS — S79911A Unspecified injury of right hip, initial encounter: Secondary | ICD-10-CM | POA: Diagnosis present

## 2021-08-11 DIAGNOSIS — R471 Dysarthria and anarthria: Secondary | ICD-10-CM | POA: Diagnosis not present

## 2021-08-11 DIAGNOSIS — S0181XA Laceration without foreign body of other part of head, initial encounter: Secondary | ICD-10-CM | POA: Diagnosis present

## 2021-08-11 DIAGNOSIS — N390 Urinary tract infection, site not specified: Secondary | ICD-10-CM | POA: Diagnosis not present

## 2021-08-11 DIAGNOSIS — Z79899 Other long term (current) drug therapy: Secondary | ICD-10-CM | POA: Diagnosis not present

## 2021-08-11 DIAGNOSIS — S32591A Other specified fracture of right pubis, initial encounter for closed fracture: Secondary | ICD-10-CM | POA: Diagnosis not present

## 2021-08-11 DIAGNOSIS — Z91010 Allergy to peanuts: Secondary | ICD-10-CM | POA: Diagnosis not present

## 2021-08-11 DIAGNOSIS — M0609 Rheumatoid arthritis without rheumatoid factor, multiple sites: Secondary | ICD-10-CM | POA: Diagnosis present

## 2021-08-11 DIAGNOSIS — Z7982 Long term (current) use of aspirin: Secondary | ICD-10-CM | POA: Diagnosis not present

## 2021-08-11 LAB — CBC WITH DIFFERENTIAL/PLATELET
Abs Immature Granulocytes: 0.08 10*3/uL — ABNORMAL HIGH (ref 0.00–0.07)
Basophils Absolute: 0 10*3/uL (ref 0.0–0.1)
Basophils Relative: 0 %
Eosinophils Absolute: 0 10*3/uL (ref 0.0–0.5)
Eosinophils Relative: 0 %
HCT: 40.2 % (ref 36.0–46.0)
Hemoglobin: 13.1 g/dL (ref 12.0–15.0)
Immature Granulocytes: 1 %
Lymphocytes Relative: 9 %
Lymphs Abs: 1.3 10*3/uL (ref 0.7–4.0)
MCH: 32 pg (ref 26.0–34.0)
MCHC: 32.6 g/dL (ref 30.0–36.0)
MCV: 98.3 fL (ref 80.0–100.0)
Monocytes Absolute: 0.8 10*3/uL (ref 0.1–1.0)
Monocytes Relative: 6 %
Neutro Abs: 11.9 10*3/uL — ABNORMAL HIGH (ref 1.7–7.7)
Neutrophils Relative %: 84 %
Platelets: 233 10*3/uL (ref 150–400)
RBC: 4.09 MIL/uL (ref 3.87–5.11)
RDW: 14 % (ref 11.5–15.5)
WBC: 14.2 10*3/uL — ABNORMAL HIGH (ref 4.0–10.5)
nRBC: 0 % (ref 0.0–0.2)

## 2021-08-11 LAB — BASIC METABOLIC PANEL
Anion gap: 11 (ref 5–15)
BUN: 21 mg/dL (ref 8–23)
CO2: 22 mmol/L (ref 22–32)
Calcium: 9.4 mg/dL (ref 8.9–10.3)
Chloride: 106 mmol/L (ref 98–111)
Creatinine, Ser: 1.17 mg/dL — ABNORMAL HIGH (ref 0.44–1.00)
GFR, Estimated: 48 mL/min — ABNORMAL LOW (ref >60)
Glucose, Bld: 134 mg/dL — ABNORMAL HIGH (ref 70–99)
Potassium: 4.5 mmol/L (ref 3.5–5.1)
Sodium: 139 mmol/L (ref 135–145)

## 2021-08-11 MED ORDER — ONDANSETRON HCL 4 MG/2ML IJ SOLN: 4.0000 mg | Freq: Four times a day (QID) | INTRAMUSCULAR | Status: DC | PRN

## 2021-08-11 MED ORDER — FENTANYL CITRATE PF 50 MCG/ML IJ SOSY
50.0000 ug | PREFILLED_SYRINGE | Freq: Once | INTRAMUSCULAR | Status: AC
  Administered 2021-08-11: 50 ug via INTRAVENOUS
  Filled 2021-08-11: qty 1

## 2021-08-11 MED ORDER — LIDOCAINE HCL (PF) 1 % IJ SOLN
5.0000 mL | Freq: Once | INTRAMUSCULAR | Status: AC
  Administered 2021-08-11: 5 mL via INTRADERMAL

## 2021-08-11 MED ORDER — ACETAMINOPHEN 325 MG PO TABS: 650.0000 mg | ORAL_TABLET | Freq: Four times a day (QID) | ORAL | Status: DC | PRN

## 2021-08-11 MED ORDER — BACITRACIN ZINC 500 UNIT/GM EX OINT
TOPICAL_OINTMENT | Freq: Once | CUTANEOUS | Status: AC
  Filled 2021-08-11: qty 0.9

## 2021-08-11 MED ORDER — ONDANSETRON HCL 4 MG/2ML IJ SOLN
4.0000 mg | Freq: Once | INTRAMUSCULAR | Status: AC
  Administered 2021-08-11: 4 mg via INTRAVENOUS
  Filled 2021-08-11: qty 2

## 2021-08-11 MED ORDER — SODIUM CHLORIDE 0.9 % IV BOLUS (SEPSIS)
500.0000 mL | Freq: Once | INTRAVENOUS | Status: AC
  Administered 2021-08-11: 500 mL via INTRAVENOUS

## 2021-08-11 MED ORDER — ENOXAPARIN SODIUM 40 MG/0.4ML IJ SOSY
40.0000 mg | PREFILLED_SYRINGE | INTRAMUSCULAR | Status: DC
  Administered 2021-08-11 – 2021-08-15 (×5): 40 mg via SUBCUTANEOUS
  Filled 2021-08-11 (×5): qty 0.4

## 2021-08-11 MED ORDER — ACETAMINOPHEN 650 MG RE SUPP: 650.0000 mg | Freq: Four times a day (QID) | RECTAL | Status: DC | PRN

## 2021-08-11 MED ORDER — SODIUM CHLORIDE 0.9 % IV SOLN: INTRAVENOUS | Status: DC

## 2021-08-11 MED ORDER — ONDANSETRON HCL 4 MG PO TABS: 4.0000 mg | ORAL_TABLET | Freq: Four times a day (QID) | ORAL | Status: DC | PRN

## 2021-08-11 MED ORDER — HYDROCODONE-ACETAMINOPHEN 5-325 MG PO TABS
1.0000 | ORAL_TABLET | ORAL | 0 refills | Status: DC | PRN
Start: 2021-08-11 — End: 2021-08-15

## 2021-08-11 MED ORDER — LIDOCAINE HCL (PF) 1 % IJ SOLN
INTRAMUSCULAR | Status: AC
  Administered 2021-08-11: 5 mL via INTRADERMAL
  Filled 2021-08-11: qty 5

## 2021-08-11 MED ORDER — TETANUS-DIPHTH-ACELL PERTUSSIS 5-2.5-18.5 LF-MCG/0.5 IM SUSY
0.5000 mL | PREFILLED_SYRINGE | Freq: Once | INTRAMUSCULAR | Status: AC
  Administered 2021-08-11: 0.5 mL via INTRAMUSCULAR
  Filled 2021-08-11: qty 0.5

## 2021-08-11 MED ORDER — HYDROCODONE-ACETAMINOPHEN 5-325 MG PO TABS
1.0000 | ORAL_TABLET | ORAL | Status: DC | PRN
  Administered 2021-08-11 – 2021-08-13 (×5): 2 via ORAL
  Administered 2021-08-15: 1 via ORAL
  Filled 2021-08-11: qty 1
  Filled 2021-08-11 (×5): qty 2

## 2021-08-11 MED ORDER — MORPHINE SULFATE (PF) 2 MG/ML IV SOLN: 2.0000 mg | INTRAVENOUS | Status: DC | PRN

## 2021-08-11 MED ORDER — LIDOCAINE HCL (PF) 1 % IJ SOLN
5.0000 mL | Freq: Once | INTRAMUSCULAR | Status: AC
  Filled 2021-08-11: qty 5

## 2021-08-11 NOTE — Assessment & Plan Note (Signed)
Continue nocturnal oxygen 

## 2021-08-11 NOTE — Hospital Course (Addendum)
76 year old female past medical history of hypertension, pulmonary fibrosis/interstitial lung disease, rheumatoid arthritis and GERD presented to the emergency room on the early morning hours of 8/7 after an accidental fall.  No loss of consciousness.  Patient noted to have a small right frontal scalp laceration x-rays noting nondisplaced right superior and inferior pubic rami fractures.  Patient brought in for further evaluation.  Seen by PT and OT who recommended home health.  Pain relatively well controlled.  Initial plan was to discharge on 8/8 and while nursing was going over with patient discharge instructions, she started having some confusion and difficulty finding words.  Vital signs checked and were unremarkable.  No elevated blood pressures.  Rapid response evaluated patient.  Hospitalist attending also checked and evaluated patient.  Symptoms improved although by 20 minutes later, had not fully resolved and the patient was able to answer questions appropriately, she herself admitted that she had trouble getting the exact right words.  Code stroke called and patient went for stat head CT which was unremarkable.  Following head CT, patient completely back to normal.   8/11 -- Patient is medically stable for discharge to rehab. Started on oral Levaquin for possible UTI and will follow up culture results that are pending.  Patient is afebrile, no leukocytosis.  If true infection, does not require IV antibiotics.

## 2021-08-11 NOTE — ED Notes (Signed)
Pt ambulated w/assistance. Able to bear weight, has pain with walking.

## 2021-08-11 NOTE — Care Management CC44 (Signed)
Condition Code 44 Documentation Completed  Patient Details  Name: EMBERLEIGH REILY MRN: [id number] Date of Birth: [birth year]   Condition Code 44 given:  Yes Patient signature on Condition Code 44 notice:  Yes Documentation of 2 MD's agreement:  Yes Code 44 added to claim:  Yes    Conception Oms, RN 08/11/2021, 3:33 PM

## 2021-08-11 NOTE — Assessment & Plan Note (Addendum)
Home regimen appears to be losartan 50 mg twice daily and Toprol-XL 50 mg daily. Bps have been normal and occasionally soft with losartan held and on lower dose metoprolol -- Stop losartan at d/c  -- Continue lower dose metoprolol 25 mg daily

## 2021-08-11 NOTE — ED Triage Notes (Signed)
Pt states she walked into a door today while getting up to restroom. Pt with laceration to right forehead and has right hip/groin pain. Bleeding controlled. Pt alert and oriented denies neck pain, denies loc.

## 2021-08-11 NOTE — Assessment & Plan Note (Addendum)
Continue Cymbalta, Remeron at bedtime
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
## 2021-08-11 NOTE — Assessment & Plan Note (Addendum)
Per orthopedic surgery, this is nonoperative. -- SNF/rehab at d/c for ongoing PT and OT -- Continue pain control as needed

## 2021-08-11 NOTE — Assessment & Plan Note (Addendum)
Per med history taken by pharmacy, patient no longer takes PPI.

## 2021-08-11 NOTE — Plan of Care (Signed)

## 2021-08-11 NOTE — Evaluation (Signed)
Physical Therapy Evaluation Patient Details Name: Stephanie Patel MRN: [id number] DOB: Apr 07, 1945 Today's Date: 08/11/2021  History of Present Illness  Stephanie Patel is a 76 y.o. female with medical history significant for HTN, GERD, depression, rheumatoid arthritis, interstitial lung disease/pulmonary fibrosis, IBS who presents to the ED with right hip pain following an accidental fall after she walked into a door.  She also sustained a laceration to her forehead.  ED course and data review: Vitals within normal limits except for slightly elevated blood pressure of 141/76.  Labs with WBC 14,000 and otherwise normal CBC and BMP with creatinine 1.17 which is above most recent baseline of 0.9 on 02/2021.  Imaging: CT head showed a small right frontal scalp laceration with no acute intracranial abnormality or skull fracture  CT C-spine showing multilevel degenerative disc disease without acute fracture  X-rays hip shows essentially nondisplaced right superior and inferior multiple pubic rami fractures at the pubic body  Clinical Impression  Pt received in bed agreeable to participate in PT evaluation. Pt has increase pain with movement otherwise 3/10 at rest.  Today's assessment revealed pt is able to perform bed mobility independently. Transfers with CGA of 1 and  ambulated with RW ~48 ft with CGA of 1. Pt demonstrates fair + standing balance with RW. PT provided pt with AROM to BLE and advised the pt to perform all exs within pain free range every 4 hours 10 reps. Pt Felt relief in pain with kegel and core engagement exs. Pt will benefit form HHPT after acute care to return to PLOF and reduce fall risk.      Recommendations for follow up therapy are one component of a multi-disciplinary discharge planning process, led by the attending physician.  Recommendations may be updated based on patient status, additional functional criteria and insurance authorization.  Follow Up Recommendations Home health PT       Assistance Recommended at Discharge Intermittent Supervision/Assistance  Patient can return home with the following  A little help with walking and/or transfers;A little help with bathing/dressing/bathroom;Assistance with cooking/housework;Assistance with feeding;Assist for transportation;Help with stairs or ramp for entrance    Equipment Recommendations    Recommendations for Other Services       Functional Status Assessment Patient has had a recent decline in their functional status and demonstrates the ability to make significant improvements in function in a reasonable and predictable amount of time.     Precautions / Restrictions Precautions Precautions: Fall Restrictions Weight Bearing Restrictions: No      Mobility  Bed Mobility Overal bed mobility: Independent                  Transfers Overall transfer level: Needs assistance Equipment used: Rolling walker (2 wheels) Transfers: Sit to/from Stand, Bed to chair/wheelchair/BSC Sit to Stand: Min guard   Step pivot transfers: Min guard            Ambulation/Gait Ambulation/Gait assistance: Min guard Gait Distance (Feet): 48 Feet Assistive device: Rolling walker (2 wheels) Gait Pattern/deviations: Step-to pattern, Decreased stride length, Antalgic Gait velocity: decreased        Stairs            Wheelchair Mobility    Modified Rankin (Stroke Patients Only)       Balance Overall balance assessment: Needs assistance Sitting-balance support: Feet supported Sitting balance-Leahy Scale: Good     Standing balance support: Bilateral upper extremity supported Standing balance-Leahy Scale: Fair  Pertinent Vitals/Pain Pain Assessment Pain Assessment: 0-10 Pain Score: 3  Pain Location: L groin Pain Descriptors / Indicators: Sharp Pain Intervention(s): Patient requesting pain meds-RN notified    Home Living Family/patient expects to be  discharged to:: Private residence Living Arrangements: Alone Available Help at Discharge: Other (Comment) (in a senior living community) Type of Home: House         Home Layout: [place]: Conservation officer, nature (2 wheels);Shower seat;BSC/3in1;Shower seat - built in      Prior Function Prior Level of Function : Independent/Modified Independent with ADL and IADLs.                ADLs Comments: Independent     Hand Dominance   Dominant Hand: Right    Extremity/Trunk Assessment   Upper Extremity Assessment Upper Extremity Assessment: Overall WFL for tasks assessed    Lower Extremity Assessment Lower Extremity Assessment: Generalized weakness;RLE deficits/detail RLE Deficits / Details: 3-/5 Knee and 2+/5 hip RLE: Unable to fully assess due to pain RLE Sensation: WNL       Communication   Communication: No difficulties  Cognition Arousal/Alertness: Awake/alert Behavior During Therapy: WFL for tasks assessed/performed Overall Cognitive Status: Within Functional Limits for tasks assessed                                          General Comments General comments (skin integrity, edema, etc.): sutures in place and hematoma noted in R forehead    Exercises General Exercises - Lower Extremity Ankle Circles/Pumps: AROM, 10 reps, Seated Gluteal Sets: Strengthening, 10 reps, Seated Long Arc Quad: AROM, 10 reps, Seated Heel Slides: AROM, 10 reps, Seated Other Exercises Other Exercises: Core engaement and Keegal exs 5 reps 10 secs hold.   Assessment/Plan    PT Assessment Patient needs continued PT services  PT Problem List Decreased strength;Decreased range of motion;Decreased activity tolerance;Decreased balance;Decreased mobility;Pain;Decreased skin integrity       PT Treatment Interventions Gait training;Functional mobility training;Therapeutic activities;Therapeutic exercise;Balance training;Neuromuscular re-education;Patient/family  education    PT Goals (Current goals can be found in the Care Plan section)  Acute Rehab PT Goals Patient Stated Goal: " I want to get better." PT Goal Formulation: With patient Time For Goal Achievement: 08/25/21 Potential to Achieve Goals: Good    Frequency 7X/week     Co-evaluation               AM-PAC PT "6 Clicks" Mobility  Outcome Measure Help needed turning from your back to your side while in a flat bed without using bedrails?: None Help needed moving from lying on your back to sitting on the side of a flat bed without using bedrails?: None Help needed moving to and from a bed to a chair (including a wheelchair)?: A Little Help needed standing up from a chair using your arms (e.g., wheelchair or bedside chair)?: A Little Help needed to walk in hospital room?: A Little Help needed climbing 3-5 steps with a railing? : A Lot 6 Click Score: 19    End of Session Equipment Utilized During Treatment: Gait belt Activity Tolerance: Patient tolerated treatment well;Patient limited by pain Patient left: in chair;with call bell/phone within reach;with chair alarm set Nurse Communication: Mobility status PT Visit Diagnosis: Unsteadiness on feet (R26.81);Other abnormalities of gait and mobility (R26.89);Repeated falls (R29.6);Muscle weakness (generalized) (M62.81);Difficulty in walking, not elsewhere classified (R26.2);Pain Pain - Right/Left: Right  Pain - part of body: Hip    Time: 0109-3235 PT Time Calculation (min) (ACUTE ONLY): 27 min   Charges:   PT Evaluation $PT Eval Low Complexity: 1 Low PT Treatments $Gait Training: 8-22 mins $Therapeutic Activity: 8-22 mins        Joaquin Music PT DPT 12:06 PM,08/11/21

## 2021-08-11 NOTE — Assessment & Plan Note (Deleted)
Accidental fall.  PT eval

## 2021-08-11 NOTE — Assessment & Plan Note (Addendum)
Repaired in the emergency room.  Received Tdap.  Patient will follow-up with her PCP for staple removal

## 2021-08-11 NOTE — Evaluation (Signed)
Occupational Therapy Evaluation Patient Details Name: Stephanie Patel MRN: [id number] DOB: 1945/05/01 Today's Date: 08/11/2021   History of Present Illness ROCKELLE HEUERMAN is a 76 y.o. female with medical history significant for HTN, GERD, depression, rheumatoid arthritis, interstitial lung disease/pulmonary fibrosis, IBS who presents to the ED with right hip pain following an accidental fall after she walked into a door.  She also sustained a laceration to her forehead.  ED course and data review: Vitals within normal limits except for slightly elevated blood pressure of 141/76.  Labs with WBC 14,000 and otherwise normal CBC and BMP with creatinine 1.17 which is above most recent baseline of 0.9 on 02/2021.  Imaging: CT head showed a small right frontal scalp laceration with no acute intracranial abnormality or skull fracture  CT C-spine showing multilevel degenerative disc disease without acute fracture  X-rays hip shows essentially nondisplaced right superior and inferior multiple pubic rami fractures at the pubic body.   Clinical Impression   Pt seen for OT evaluation this date.  Pt up in recliner upon OT arrival.  Agreeable to amb to bathroom for toileting before returning to bed.  Min guard to utilize RW safely within room, including directional changes.  Pt able to manage clothing in prep for toileting and perform hand hygiene both in standing with min guard, and performed toilet transfer with min A, cue to utilize grab bar for support.  Will benefit from 3in1 over toilet for next attempt to maximize indep with transfer.  Pt reporting 5/10 pain in R groin with activity.  Nursing notified and issued pain meds during session.  Pt will benefit from continued OT in the acute setting to maximize safety and indep with ADLs and functional transfers.  Recommend [place] OT upon discharge to work towards return to PLOF.  Pt resides in [facility] and has all necessary DME needs at this time.        Recommendations for follow up therapy are one component of a multi-disciplinary discharge planning process, led by the attending physician.  Recommendations may be updated based on patient status, additional functional criteria and insurance authorization.   Follow Up Recommendations  Home health OT    Assistance Recommended at Discharge Intermittent Supervision/Assistance  Patient can return home with the following A little help with walking and/or transfers;A little help with bathing/dressing/bathroom;Assistance with cooking/housework;Help with stairs or ramp for entrance;Assist for transportation    Functional Status Assessment  Patient has had a recent decline in their functional status and demonstrates the ability to make significant improvements in function in a reasonable and predictable amount of time.  Equipment Recommendations  None recommended by OT           Precautions / Restrictions Precautions Precautions: Fall Restrictions Weight Bearing Restrictions: No      Mobility Bed Mobility Overal bed mobility: Independent             General bed mobility comments: increased time to lift legs sitting>supine d/t pain Patient Response: Cooperative  Transfers Overall transfer level: Needs assistance Equipment used: Rolling walker (2 wheels) Transfers: Sit to/from Stand Sit to Stand: Min guard                  Balance Overall balance assessment: Needs assistance Sitting-balance support: Feet supported Sitting balance-Leahy Scale: Good     Standing balance support: Bilateral upper extremity supported Standing balance-Leahy Scale: Fair Standing balance comment: min guard to perform standing ADLs while reaching within BOS  ADL either performed or assessed with clinical judgement   ADL Overall ADL's : Needs assistance/impaired Eating/Feeding: Independent;Bed level Eating/Feeding Details (indicate cue type and  reason): chair position at bed level Grooming: Wash/dry hands;Min guard;Standing           Upper Body Dressing : Set up   Lower Body Dressing: Minimal assistance   Toilet Transfer: Minimal assistance;Regular Toilet;Grab bars   Toileting- Clothing Manipulation and Hygiene: Supervision/safety       Functional mobility during ADLs: Min guard;Rolling walker (2 wheels) General ADL Comments: Pt ambulated from recliner to bathroom to complete toileting, then ambulated back to bed as lunch tray had arrived.  Min guard with RW.     Vision Patient Visual Report: No change from baseline                  Pertinent Vitals/Pain Pain Assessment Pain Score: 5  Pain Location: R groin Pain Descriptors / Indicators: Sharp, Grimacing Pain Intervention(s): RN gave pain meds during session     Hand Dominance Right   Extremity/Trunk Assessment Upper Extremity Assessment Upper Extremity Assessment: Overall WFL for tasks assessed (RA deformities bilat hands, but WFL for ADLs and transfers)   Lower Extremity Assessment Lower Extremity Assessment: Generalized weakness;Defer to PT evaluation;Overall WFL for tasks assessed RLE Deficits / Details: R pubic rami fxs RLE: Unable to fully assess due to pain RLE Sensation: WNL       Communication Communication Communication: No difficulties   Cognition Arousal/Alertness: Awake/alert Behavior During Therapy: WFL for tasks assessed/performed Overall Cognitive Status: Within Functional Limits for tasks assessed                                       General Comments  R side of forehead badly bruised               Home Living Family/patient expects to be discharged to:: Private residence Living Arrangements: Alone Available Help at Discharge: Other (Comment) Scientist, forensic) Type of Home: House       Home Layout: One level     Bathroom Shower/Tub: Occupational psychologist: Handicapped  height Bathroom Accessibility: Yes   Home Equipment: Conservation officer, nature (2 wheels);Shower seat;BSC/3in1;Shower seat - built in          Prior Functioning/Environment Prior Level of Function : Independent/Modified Independent             Mobility Comments: pt reports she used a walker "at times" ADLs Comments: modified indep        OT Problem List: Decreased strength;Decreased activity tolerance;Impaired balance (sitting and/or standing);Pain      OT Treatment/Interventions: Self-care/ADL training;Therapeutic exercise;Patient/family education;Balance training;Therapeutic activities;DME and/or AE instruction;Energy conservation    OT Goals(Current goals can be found in the care plan section) Acute Rehab OT Goals Patient Stated Goal: To feel better OT Goal Formulation: With patient Time For Goal Achievement: 08/25/21 Potential to Achieve Goals: Good  OT Frequency: Min 2X/week                  AM-PAC OT "6 Clicks" Daily Activity     Outcome Measure Help from another person eating meals?: None Help from another person taking care of personal grooming?: A Little Help from another person toileting, which includes using toliet, bedpan, or urinal?: A Little Help from another person bathing (including washing, rinsing, drying)?: A Little Help from another person to  put on and taking off regular upper body clothing?: None Help from another person to put on and taking off regular lower body clothing?: A Little 6 Click Score: 20   End of Session Equipment Utilized During Treatment: Gait belt;Rolling walker (2 wheels) Nurse Communication: Mobility status;Patient requests pain meds  Activity Tolerance: Patient tolerated treatment well Patient left: in bed;with call bell/phone within reach;with bed alarm set  OT Visit Diagnosis: Unsteadiness on feet (R26.81);Muscle weakness (generalized) (M62.81);History of falling (Z91.81)                Time: 7542-3702 OT Time Calculation  (min): 13 min Charges:  OT General Charges $OT Visit: 1 Visit OT Evaluation $OT Eval Low Complexity: 1 Low  Leta Speller, MS, OTR/L   Darleene Cleaver 08/11/2021, 1:50 PM

## 2021-08-11 NOTE — TOC Progression Note (Signed)
Transition of Care [facility]) - Progression Note    Patient Details  Name: Stephanie Patel MRN: [id number] Date of Birth: [birth year]  Transition of [facility]) CM/SW [place], RN Phone Number: 08/11/2021, 3:38 PM  Clinical Narrative:  Verbally reviewed the Code 65 with the patient, she stated understanding, printed copy provided     Expected Discharge Plan: [place] Barriers to Discharge: Barriers Resolved  Expected Discharge Plan and Services Expected Discharge Plan: [place]   Discharge Planning Services: CM Consult   Living arrangements for the past 2 months: [place]                   DME Agency: NA       HH Arranged: PT, OT HH Agency: [place] Date [facility] Agency Contacted: 08/11/21 Time [place]: 0929 Representative spoke with at [place]: Tommi Rumps   Social Determinants of Health ([place]) Interventions    Readmission Risk Interventions     No data to display

## 2021-08-11 NOTE — Progress Notes (Signed)
Met with the patient  She is agreeable to [facility], set her up with [facility]  She has a rolling walker and 3 in 1 at home, she drives and has back up transportation
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
## 2021-08-11 NOTE — Assessment & Plan Note (Addendum)
Continue home medications. Follow up as scheduled with PCP and/or rheumatology.

## 2021-08-11 NOTE — Progress Notes (Signed)
   Follow Up Note  HPI: 76 year old female past medical history of hypertension, pulmonary fibrosis/interstitial lung disease, rheumatoid arthritis and GERD presented to the emergency room on the early morning hours of 8/7 after an accidental fall.  No loss of consciousness.  Patient noted to have a small right frontal scalp laceration x-rays noting nondisplaced right superior and inferior pubic rami fractures.  Patient brought in for further evaluation.  Pt admitted earlier this morning.  Seen after arrived to floor.    Exam: CV: Regular rate and rhythm, S1-S2 Lungs: Clear to auscultation bilaterally   Principal Problem:   Closed fracture of multiple pubic rami, right, initial encounter (HCC) Active Problems:   Accidental fall   Laceration of forehead   ILD (interstitial lung disease) (HCC)   Rheumatoid arthritis of multiple sites with negative rheumatoid factor (HCC)   Depression   IBS (irritable bowel syndrome)   GERD (gastroesophageal reflux disease)   HTN, goal below 140/80   Pelvic fracture (HCC)   Cardiovascular and Mediastinum HTN, goal below 140/80 Assessment & Plan Continue losartan and metoprolol.  Pain control to help with blood pressure control  Respiratory ILD (interstitial lung disease) (HCC) Assessment & Plan Continue nocturnal oxygen  Digestive GERD (gastroesophageal reflux disease) Assessment & Plan Continue Protonix  Musculoskeletal and Integument Rheumatoid arthritis of multiple sites with negative rheumatoid factor (HCC) Assessment & Plan Pending med rec for continuation of Humira, methotrexate, leflunomide and Remicade  * Closed fracture of multiple pubic rami, right, initial encounter ([place]) Assessment & Plan Pain moderately controlled on Vicodin.  Seen by PT and OT who are recommending home health.  In the process of setting up.  Discussed with orthopedic surgery and fracture will heal without surgery.  Other Depression Assessment &  Plan Continue Wellbutrin and buspirone and Pristiq  Laceration of forehead Assessment & Plan Repaired in the emergency room.  Received Tdap.  Patient will follow-up with her PCP for staple removal  Accidental fall Assessment & Plan According to patient daughters, patient has had a number of falls.  Home health PT and OT being set up.  Patient's daughters are also looking into a private nurse, which they were doing even before this hospitalization  We will plan to discharge in the morning with home health

## 2021-08-11 NOTE — Assessment & Plan Note (Addendum)
According to patient daughters, patient has had approximately 6 falls in the past month and a half.  Lives alone.   -- PT OT recommendation to SNF/rehab -- Patient and daughter is agreeable to SNF -- Approved for d/c to SNF today and medically stable

## 2021-08-12 ENCOUNTER — Observation Stay: Payer: Medicare PPO

## 2021-08-12 DIAGNOSIS — I10 Essential (primary) hypertension: Secondary | ICD-10-CM

## 2021-08-12 DIAGNOSIS — S32591A Other specified fracture of right pubis, initial encounter for closed fracture: Secondary | ICD-10-CM | POA: Diagnosis not present

## 2021-08-12 DIAGNOSIS — R471 Dysarthria and anarthria: Secondary | ICD-10-CM | POA: Diagnosis not present

## 2021-08-12 DIAGNOSIS — S32501A Unspecified fracture of right pubis, initial encounter for closed fracture: Secondary | ICD-10-CM | POA: Diagnosis not present

## 2021-08-12 DIAGNOSIS — M0609 Rheumatoid arthritis without rheumatoid factor, multiple sites: Secondary | ICD-10-CM

## 2021-08-12 HISTORY — DX: Dysarthria and anarthria: R47.1

## 2021-08-12 LAB — BASIC METABOLIC PANEL
Anion gap: 4 — ABNORMAL LOW (ref 5–15)
BUN: 18 mg/dL (ref 8–23)
CO2: 27 mmol/L (ref 22–32)
Calcium: 8.7 mg/dL — ABNORMAL LOW (ref 8.9–10.3)
Chloride: 110 mmol/L (ref 98–111)
Creatinine, Ser: 1.11 mg/dL — ABNORMAL HIGH (ref 0.44–1.00)
GFR, Estimated: 51 mL/min — ABNORMAL LOW (ref >60)
Glucose, Bld: 102 mg/dL — ABNORMAL HIGH (ref 70–99)
Potassium: 4.5 mmol/L (ref 3.5–5.1)
Sodium: 141 mmol/L (ref 135–145)

## 2021-08-12 LAB — CBC
HCT: 35.6 % — ABNORMAL LOW (ref 36.0–46.0)
Hemoglobin: 11.5 g/dL — ABNORMAL LOW (ref 12.0–15.0)
MCH: 31.9 pg (ref 26.0–34.0)
MCHC: 32.3 g/dL (ref 30.0–36.0)
MCV: 98.9 fL (ref 80.0–100.0)
Platelets: 179 10*3/uL (ref 150–400)
RBC: 3.6 MIL/uL — ABNORMAL LOW (ref 3.87–5.11)
RDW: 14.2 % (ref 11.5–15.5)
WBC: 6.2 10*3/uL (ref 4.0–10.5)
nRBC: 0 % (ref 0.0–0.2)

## 2021-08-12 LAB — HEMOGLOBIN A1C
Hgb A1c MFr Bld: 5.3 % (ref 4.8–5.6)
Mean Plasma Glucose: 105.41 mg/dL

## 2021-08-12 LAB — GLUCOSE, CAPILLARY: Glucose-Capillary: 124 mg/dL — ABNORMAL HIGH (ref 70–99)

## 2021-08-12 MED ORDER — ASPIRIN 81 MG PO TBEC
81.0000 mg | DELAYED_RELEASE_TABLET | Freq: Every day | ORAL | Status: DC
  Administered 2021-08-12 – 2021-08-15 (×4): 81 mg via ORAL
  Filled 2021-08-12 (×4): qty 1

## 2021-08-12 MED ORDER — ASPIRIN 81 MG PO TBEC: 81.0000 mg | DELAYED_RELEASE_TABLET | Freq: Every day | ORAL | 12 refills | Status: DC

## 2021-08-12 NOTE — Plan of Care (Signed)
  Problem: Education: Goal: Knowledge of General Education information will improve Description: Including pain rating scale, medication(s)/side effects and non-pharmacologic comfort measures 08/12/2021 1005 by Alferd Apa, RN Outcome: Adequate for Discharge 08/12/2021 [phone] by Alferd Apa, RN Outcome: Progressing   Problem: Health Behavior/Discharge Planning: Goal: Ability to manage health-related needs will improve 08/12/2021 1005 by Alferd Apa, RN Outcome: Adequate for Discharge 08/12/2021 0837 by Alferd Apa, RN Outcome: Progressing   Problem: Clinical Measurements: Goal: Ability to maintain clinical measurements within normal limits will improve 08/12/2021 1005 by Alferd Apa, RN Outcome: Adequate for Discharge 08/12/2021 [phone] by Alferd Apa, RN Outcome: Progressing Goal: Will remain free from infection 08/12/2021 1005 by Alferd Apa, RN Outcome: Adequate for Discharge 08/12/2021 [phone] by Alferd Apa, RN Outcome: Progressing Goal: Diagnostic test results will improve 08/12/2021 1005 by Alferd Apa, RN Outcome: Adequate for Discharge 08/12/2021 8119 by Alferd Apa, RN Outcome: Progressing Goal: Respiratory complications will improve 08/12/2021 1005 by Alferd Apa, RN Outcome: Adequate for Discharge 08/12/2021 [phone] by Alferd Apa, RN Outcome: Progressing Goal: Cardiovascular complication will be avoided 08/12/2021 1005 by Alferd Apa, RN Outcome: Adequate for Discharge 08/12/2021 [phone] by Alferd Apa, RN Outcome: Progressing   Problem: Activity: Goal: Risk for activity intolerance will decrease 08/12/2021 1005 by Alferd Apa, RN Outcome: Adequate for Discharge 08/12/2021 [phone] by Alferd Apa, RN Outcome: Progressing   Problem: Nutrition: Goal: Adequate nutrition will be maintained 08/12/2021 1005 by Alferd Apa, RN Outcome: Adequate for Discharge 08/12/2021 [phone] by Alferd Apa, RN Outcome: Progressing   Problem: Coping: Goal: Level of anxiety will  decrease 08/12/2021 1005 by Alferd Apa, RN Outcome: Adequate for Discharge 08/12/2021 [phone] by Alferd Apa, RN Outcome: Progressing   Problem: Elimination: Goal: Will not experience complications related to bowel motility 08/12/2021 1005 by Alferd Apa, RN Outcome: Adequate for Discharge 08/12/2021 [phone] by Alferd Apa, RN Outcome: Progressing Goal: Will not experience complications related to urinary retention 08/12/2021 1005 by Alferd Apa, RN Outcome: Adequate for Discharge 08/12/2021 [phone] by Alferd Apa, RN Outcome: Progressing   Problem: Pain Managment: Goal: General experience of comfort will improve 08/12/2021 1005 by Alferd Apa, RN Outcome: Adequate for Discharge 08/12/2021 [phone] by Alferd Apa, RN Outcome: Progressing   Problem: Safety: Goal: Ability to remain free from injury will improve 08/12/2021 1005 by Alferd Apa, RN Outcome: Adequate for Discharge 08/12/2021 [phone] by Alferd Apa, RN Outcome: Progressing   Problem: Skin Integrity: Goal: Risk for impaired skin integrity will decrease 08/12/2021 1005 by Alferd Apa, RN Outcome: Adequate for Discharge 08/12/2021 [phone] by Alferd Apa, RN Outcome: Progressing

## 2021-08-12 NOTE — Progress Notes (Signed)
Discharge pending daughter's arrival for transport. Pt is orthopedic with pubic fractures.

## 2021-08-12 NOTE — Plan of Care (Signed)

## 2021-08-12 NOTE — Progress Notes (Signed)
   08/12/21 1500  Clinical Encounter Type  Visited With Patient  Visit Type Initial  Referral From Nurse  Consult/Referral To Chaplain   Chaplain responded to rapid response page. Chaplain arrived and patient was speaking on the phone and care team was assessing needs. No family is present and there are no current needs for Chaplain services. If needs should arise, please page the on call Chaplain.

## 2021-08-12 NOTE — Progress Notes (Signed)
Rapid Response Event Note   Reason for Call : acute onset of confusion   Initial Focused Assessment: On my arrival pt is lying in bed, alert. Pt knows her name and that she is in the hospital. Primary RN states that pt has been fully alert and oriented all day until now. RN states that pt started getting agitated and confused and repeatedly talking about money. Pt has received no medication recently. Pt has a laceration and bruise on her right eye/forehead from fall at home 2 days ago. Previous head CT negative for anything acute. VSS on room air.   Interventions: CBG checked and WNL. Neuro assessment performed and pt has equal strength in all extremities. Pupils are equal and reactive. Dr. Maryland Pink paged and arrived at bedside.   Plan of Care: Head CT ordered by MD. Pt will remain on 1A at this time. This RN will follow up when CT results are back.    Event Summary:   MD Notified: Dr. Maryland Pink Call Time: 6712 Arrival Time: 4580 End Time: [place]  Trellis Paganini, RN

## 2021-08-12 NOTE — Progress Notes (Signed)
Physical Therapy Treatment Patient Details Name: Stephanie Patel MRN: [id number] DOB: [birth year] Today's Date: 08/12/2021   History of Present Illness Stephanie Patel is a 76 y.o. female with medical history significant for HTN, GERD, depression, rheumatoid arthritis, interstitial lung disease/pulmonary fibrosis, IBS who presents to the ED with right hip pain following an accidental fall after she walked into a door.  She also sustained a laceration to her forehead.  ED course and data review: Vitals within normal limits except for slightly elevated blood pressure of 141/76.  Labs with WBC 14,000 and otherwise normal CBC and BMP with creatinine 1.17 which is above most recent baseline of 0.9 on 02/2021.  Imaging: CT head showed a small right frontal scalp laceration with no acute intracranial abnormality or skull fracture  CT C-spine showing multilevel degenerative disc disease without acute fracture  X-rays hip shows essentially nondisplaced right superior and inferior multiple pubic rami fractures at the pubic body.    PT Comments    The patient is making progress with functional independence and activity tolerance. She ambulated in hallway again today with Min guard initially progressing to supervision with improving gait pattern with increased ambulation distance. She reports 4/10 pain in R groin with mobility and no pain at rest. The patient is planning to have assistance from her daughter at discharge. HHPT is recommended.    Recommendations for follow up therapy are one component of a multi-disciplinary discharge planning process, led by the attending physician.  Recommendations may be updated based on patient status, additional functional criteria and insurance authorization.  Follow Up Recommendations  Home health PT     Assistance Recommended at Discharge Intermittent Supervision/Assistance  Patient can return home with the following A little help with walking and/or transfers;A little help  with bathing/dressing/bathroom;Assistance with cooking/housework;Assistance with feeding;Assist for transportation;Help with stairs or ramp for entrance   Equipment Recommendations  None recommended by PT    Recommendations for Other Services       Precautions / Restrictions Precautions Precautions: Fall Restrictions Weight Bearing Restrictions: No     Mobility  Bed Mobility Overal bed mobility: Modified Independent             General bed mobility comments: with head of bed flat and no use of bed rails    Transfers Overall transfer level: Needs assistance Equipment used: Rolling walker (2 wheels) Transfers: Sit to/from Stand Sit to Stand: Min guard           General transfer comment: verbal cues for hand placement with standing and RLE positioning with sitting. patient demonstrated understanding    Ambulation/Gait Ambulation/Gait assistance: Min guard, Supervision Gait Distance (Feet): 114 Feet Assistive device: Rolling walker (2 wheels) Gait Pattern/deviations: Step-to pattern, Step-through pattern, Decreased step length - right, Antalgic Gait velocity: decreased     General Gait Details: step to pattern initially with decreased stance time on RLE. patient progressed to step through gait pattern with increased step length of RLE. reinforced to use rolling walker for ambulation and for proper positioning of rolling walker for optimal UE support.   Stairs Stairs:  (patient reports stairs not required for home entry)           Wheelchair Mobility    Modified Rankin (Stroke Patients Only)       Balance Overall balance assessment: Needs assistance Sitting-balance support: Feet supported Sitting balance-Leahy Scale: Good     Standing balance support: Bilateral upper extremity supported Standing balance-Leahy Scale: Fair  Cognition Arousal/Alertness: Awake/alert Behavior During Therapy: WFL for tasks  assessed/performed Overall Cognitive Status: Within Functional Limits for tasks assessed                                          Exercises      General Comments        Pertinent Vitals/Pain Pain Assessment Pain Assessment: 0-10 Pain Score: 4  Pain Location: R groin Pain Descriptors / Indicators: Grimacing Pain Intervention(s): Limited activity within patient's tolerance, Monitored during session, Ice applied (ice pack applied after mobilizing)    Home Living                          Prior Function            PT Goals (current goals can now be found in the care plan section) Acute Rehab PT Goals Patient Stated Goal: to have less pain, to go home PT Goal Formulation: With patient Time For Goal Achievement: 08/25/21 Potential to Achieve Goals: Good Progress towards PT goals: Progressing toward goals    Frequency    7X/week      PT Plan Current plan remains appropriate    Co-evaluation              AM-PAC PT "6 Clicks" Mobility   Outcome Measure  Help needed turning from your back to your side while in a flat bed without using bedrails?: None Help needed moving from lying on your back to sitting on the side of a flat bed without using bedrails?: None Help needed moving to and from a bed to a chair (including a wheelchair)?: A Little Help needed standing up from a chair using your arms (e.g., wheelchair or bedside chair)?: A Little Help needed to walk in hospital room?: A Little Help needed climbing 3-5 steps with a railing? : A Lot 6 Click Score: 19    End of Session Equipment Utilized During Treatment: Gait belt Activity Tolerance: Patient tolerated treatment well Patient left: in chair;with call bell/phone within reach (ice pack applied to right groin) Nurse Communication: Mobility status PT Visit Diagnosis: Unsteadiness on feet (R26.81);Other abnormalities of gait and mobility (R26.89);Repeated falls (R29.6);Muscle  weakness (generalized) (M62.81);Difficulty in walking, not elsewhere classified (R26.2);Pain Pain - Right/Left: Right Pain - part of body: Hip     Time: 6378-5885 PT Time Calculation (min) (ACUTE ONLY): 25 min  Charges:  $Gait Training: 8-22 mins $Therapeutic Activity: 8-22 mins                     Minna Merritts, PT, MPT    Percell Locus 08/12/2021, 9:25 AM

## 2021-08-12 NOTE — Progress Notes (Signed)
[facility] Progress Note  Patient: Stephanie Patel    JGG:[id number]  DOA: 08/11/2021    Date of Service: the patient was seen and examined on 08/12/2021  Brief hospital course: 76 year old female past medical history of hypertension, pulmonary fibrosis/interstitial lung disease, rheumatoid arthritis and GERD presented to the emergency room on the early morning hours of 8/7 after an accidental fall.  No loss of consciousness.  Patient noted to have a small right frontal scalp laceration x-rays noting nondisplaced right superior and inferior pubic rami fractures.  Patient brought in for further evaluation.  Seen by PT and OT who recommended home health.  Pain relatively well controlled.  Initial plan was to discharge on 8/8 and while nursing was going over with patient discharge instructions, she started having some confusion and difficulty finding words.  Vital signs checked and were unremarkable.  No elevated blood pressures.  Rapid response evaluated patient.  Hospitalist attending also checked and evaluated patient.  Symptoms improved although by 20 minutes later, had not fully resolved and the patient was able to answer questions appropriately, she herself admitted that she had trouble getting the exact right words.  Code stroke called and patient went for stat head CT which was unremarkable.  Following head CT, patient completely back to normal.  Assessment and Plan: Assessment and Plan: * Dysarthria Unclear etiology.  Possible TIA?  We will check full stroke work-up with A1c, echo, Dopplers and lipid panel.  Placed on telemetry and monitor.  Closed fracture of multiple pubic rami, right, initial encounter ([place]) Pain moderately controlled on Vicodin.  Seen by PT and OT who are recommending home health.  In the process of setting up.  Discussed with orthopedic surgery and fracture will heal without surgery.  Accidental fall According to patient daughters, patient has had a number of falls.   Home health PT and OT being set up.  Patient's daughters are also looking into a private nurse, which they were doing even before this hospitalization  Laceration of forehead Repaired in the emergency room.  Received Tdap.  Patient will follow-up with her PCP for staple removal  ILD (interstitial lung disease) ([place]) Continue nocturnal oxygen  Rheumatoid arthritis of multiple sites with negative rheumatoid factor ([place]) Pending med rec for continuation of Humira, methotrexate, leflunomide and Remicade  Depression Continue Wellbutrin and buspirone and Pristiq  GERD (gastroesophageal reflux disease) Continue Protonix  HTN, goal below 140/80 Continue losartan and metoprolol.  Pain control to help with blood pressure control       Body mass index is 23.83 kg/m.        Consultants: None  Procedures: Echocardiogram pending Carotid Dopplers pending  Antimicrobials: None  Code Status: DNR   Subjective: Patient currently feeling tired.  Seen after head CT  Objective: Vital signs were reviewed and unremarkable. Vitals:   08/12/21 1500 08/12/21 1521  BP: 126/72 109/72  Pulse:    Resp:    Temp:    SpO2:      Intake/Output Summary (Last 24 hours) at 08/12/2021 1941 Last data filed at 08/12/2021 1400 Gross per 24 hour  Intake 240 ml  Output --  Net 240 ml   Filed Weights   08/11/21 0131  Weight: 55.3 kg   Body mass index is 23.83 kg/m.  Exam:  General: Alert and oriented x3 currently, no acute distress HEENT: Normocephalic, head laceration status post repair, bruising around right side and eye Cardiovascular: Regular rate and rhythm, S1-S2 Respiratory: Clear to auscultation bilaterally Abdomen: Soft,  nontender, nondistended, positive bowel sounds Musculoskeletal: No clubbing or cyanosis, trace pitting edema Skin: Some rheumatoid deformities of hands Psychiatry: Appropriate, no evidence of psychoses Neurology: No focal deficits, downgoing toes, cranial  nerves all appear to be intact.  No asymmetric weakness, generalized  Data Reviewed: Creatinine of 1.11, normal white blood cell count  Disposition:  Status is: Observation     Anticipated discharge date: 8/9  Remaining issues to be resolved so that patient can be discharged: Stroke work-up   Family Communication: Updated daughter by phone 8/7 evening DVT Prophylaxis: enoxaparin (LOVENOX) injection 40 mg Start: 08/11/21 0800    Author: Annita Brod ,MD 08/12/2021 7:41 PM  To reach On-call, see care teams to locate the attending and reach out via [email or web address]. Between 7PM-7AM, please contact night-coverage If you still have difficulty reaching the attending provider, please page the [facility] (Director on Call) for [facility] on amion for assistance.

## 2021-08-12 NOTE — Assessment & Plan Note (Addendum)
Unclear etiology.  Possible TIA vs transient hypotension.  Underwent full stroke work-up with A1c, echo, Dopplers and lipid panel that was unremarkable.   MRI with no acute findings, showed chronic small vessel ischemic disease Symptoms resolved and have not recurred.

## 2021-08-13 ENCOUNTER — Observation Stay: Payer: Medicare PPO

## 2021-08-13 ENCOUNTER — Observation Stay
Admit: 2021-08-13 | Discharge: 2021-08-13 | Disposition: A | Discharge status: Home / Self Care | Payer: Medicare PPO | Attending: Internal Medicine | Admitting: Internal Medicine

## 2021-08-13 DIAGNOSIS — S32501A Unspecified fracture of right pubis, initial encounter for closed fracture: Secondary | ICD-10-CM | POA: Diagnosis not present

## 2021-08-13 DIAGNOSIS — R471 Dysarthria and anarthria: Secondary | ICD-10-CM | POA: Diagnosis not present

## 2021-08-13 DIAGNOSIS — I499 Cardiac arrhythmia, unspecified: Secondary | ICD-10-CM | POA: Clinically undetermined

## 2021-08-13 LAB — COMPREHENSIVE METABOLIC PANEL
ALT: 17 U/L (ref 0–44)
AST: 21 U/L (ref 15–41)
Albumin: 3 g/dL — ABNORMAL LOW (ref 3.5–5.0)
Alkaline Phosphatase: 58 U/L (ref 38–126)
Anion gap: 3 — ABNORMAL LOW (ref 5–15)
BUN: 17 mg/dL (ref 8–23)
CO2: 28 mmol/L (ref 22–32)
Calcium: 8.7 mg/dL — ABNORMAL LOW (ref 8.9–10.3)
Chloride: 108 mmol/L (ref 98–111)
Creatinine, Ser: 1.02 mg/dL — ABNORMAL HIGH (ref 0.44–1.00)
GFR, Estimated: 57 mL/min — ABNORMAL LOW (ref >60)
Glucose, Bld: 106 mg/dL — ABNORMAL HIGH (ref 70–99)
Potassium: 4 mmol/L (ref 3.5–5.1)
Sodium: 139 mmol/L (ref 135–145)
Total Bilirubin: 0.5 mg/dL (ref 0.3–1.2)
Total Protein: 6.1 g/dL — ABNORMAL LOW (ref 6.5–8.1)

## 2021-08-13 LAB — LIPID PANEL
Cholesterol: 170 mg/dL (ref 0–200)
HDL: 38 mg/dL — ABNORMAL LOW (ref >40)
LDL Cholesterol: 92 mg/dL (ref 0–99)
Total CHOL/HDL Ratio: 4.5 RATIO
Triglycerides: 199 mg/dL — ABNORMAL HIGH (ref <150)
VLDL: 40 mg/dL (ref 0–40)

## 2021-08-13 LAB — ECHOCARDIOGRAM COMPLETE
AR max vel: 2 cm2
AV Area VTI: 2.85 cm2
AV Area mean vel: 2.23 cm2
AV Mean grad: 5 mmHg
AV Peak grad: 11.3 mmHg
Ao pk vel: 1.68 m/s
Area-P 1/2: 3.21 cm2
Height: 60 in
S' Lateral: 2.4 cm
Weight: 1952 oz

## 2021-08-13 LAB — CBC
HCT: 33.6 % — ABNORMAL LOW (ref 36.0–46.0)
Hemoglobin: 11 g/dL — ABNORMAL LOW (ref 12.0–15.0)
MCH: 32.2 pg (ref 26.0–34.0)
MCHC: 32.7 g/dL (ref 30.0–36.0)
MCV: 98.2 fL (ref 80.0–100.0)
Platelets: 187 10*3/uL (ref 150–400)
RBC: 3.42 MIL/uL — ABNORMAL LOW (ref 3.87–5.11)
RDW: 14.2 % (ref 11.5–15.5)
WBC: 7 10*3/uL (ref 4.0–10.5)
nRBC: 0 % (ref 0.0–0.2)

## 2021-08-13 NOTE — Plan of Care (Signed)
  Problem: Education: Goal: Knowledge of General Education information will improve Description: Including pain rating scale, medication(s)/side effects and non-pharmacologic comfort measures 08/13/2021 0039 by Francis Dowse, RN Outcome: Progressing 08/13/2021 0011 by Francis Dowse, RN Outcome: Progressing   Problem: Health Behavior/Discharge Planning: Goal: Ability to manage health-related needs will improve 08/13/2021 0039 by Francis Dowse, RN Outcome: Progressing 08/13/2021 0011 by Francis Dowse, RN Outcome: Progressing   Problem: Clinical Measurements: Goal: Ability to maintain clinical measurements within normal limits will improve 08/13/2021 0039 by Francis Dowse, RN Outcome: Progressing 08/13/2021 0011 by Francis Dowse, RN Outcome: Progressing Goal: Will remain free from infection 08/13/2021 0039 by Francis Dowse, RN Outcome: Progressing 08/13/2021 0011 by Francis Dowse, RN Outcome: Progressing Goal: Diagnostic test results will improve 08/13/2021 0039 by Francis Dowse, RN Outcome: Progressing 08/13/2021 0011 by Francis Dowse, RN Outcome: Progressing Goal: Respiratory complications will improve 08/13/2021 0039 by Francis Dowse, RN Outcome: Progressing 08/13/2021 0011 by Francis Dowse, RN Outcome: Progressing Goal: Cardiovascular complication will be avoided 08/13/2021 0039 by Francis Dowse, RN Outcome: Progressing 08/13/2021 0011 by Francis Dowse, RN Outcome: Progressing   Problem: Activity: Goal: Risk for activity intolerance will decrease 08/13/2021 0039 by Francis Dowse, RN Outcome: Progressing 08/13/2021 0011 by Francis Dowse, RN Outcome: Progressing   Problem: Nutrition: Goal: Adequate nutrition will be maintained 08/13/2021 0039 by Francis Dowse, RN Outcome: Progressing 08/13/2021 0011 by Francis Dowse, RN Outcome:  Progressing   Problem: Coping: Goal: Level of anxiety will decrease 08/13/2021 0039 by Francis Dowse, RN Outcome: Progressing 08/13/2021 0011 by Francis Dowse, RN Outcome: Progressing   Problem: Elimination: Goal: Will not experience complications related to bowel motility 08/13/2021 0039 by Francis Dowse, RN Outcome: Progressing 08/13/2021 0011 by Francis Dowse, RN Outcome: Progressing Goal: Will not experience complications related to urinary retention 08/13/2021 0039 by Francis Dowse, RN Outcome: Progressing 08/13/2021 0011 by Francis Dowse, RN Outcome: Progressing   Problem: Pain Managment: Goal: General experience of comfort will improve 08/13/2021 0039 by Francis Dowse, RN Outcome: Progressing 08/13/2021 0011 by Francis Dowse, RN Outcome: Progressing   Problem: Safety: Goal: Ability to remain free from injury will improve 08/13/2021 0039 by Francis Dowse, RN Outcome: Progressing 08/13/2021 0011 by Francis Dowse, RN Outcome: Progressing   Problem: Skin Integrity: Goal: Risk for impaired skin integrity will decrease 08/13/2021 0039 by Francis Dowse, RN Outcome: Progressing 08/13/2021 0011 by Francis Dowse, RN Outcome: Progressing

## 2021-08-13 NOTE — Progress Notes (Signed)
Physical Therapy Treatment Patient Details Name: Stephanie Patel MRN: [id number] DOB: 09/06/1945 Today's Date: 08/13/2021   History of Present Illness CLIFTON KOVACIC is a 76 y.o. female with medical history significant for HTN, GERD, depression, rheumatoid arthritis, interstitial lung disease/pulmonary fibrosis, IBS who presents to the ED with right hip pain following an accidental fall after she walked into a door.  She also sustained a laceration to her forehead.  ED course and data review: Vitals within normal limits except for slightly elevated blood pressure of 141/76.  Labs with WBC 14,000 and otherwise normal CBC and BMP with creatinine 1.17 which is above most recent baseline of 0.9 on 02/2021.  Imaging: CT head showed a small right frontal scalp laceration with no acute intracranial abnormality or skull fracture  CT C-spine showing multilevel degenerative disc disease without acute fracture  X-rays hip shows essentially nondisplaced right superior and inferior multiple pubic rami fractures at the pubic body.    PT Comments    Yesterday the patient started having some confusion and difficulty finding words with rapid response called. Head CT was completed. The patient has decreased activity tolerance today compared to yesterday. She only ambulated in the room and needs safety cues with all mobility. She complains of dizziness at rest that does not worsen with mobility or position changes. Per care team, family is concerned about patient returning home alone. I agree that the patient cannot be discharged home alone at this time. Discharge recommendations updated for SNF. PT will continue to follow to maximize independence and facilitate return to prior level of function.    Recommendations for follow up therapy are one component of a multi-disciplinary discharge planning process, led by the attending physician.  Recommendations may be updated based on patient status, additional functional criteria  and insurance authorization.  Follow Up Recommendations  Skilled nursing-short term rehab (<3 hours/day) Can patient physically be transported by private vehicle: Yes   Assistance Recommended at Discharge Intermittent Supervision/Assistance  Patient can return home with the following A little help with walking and/or transfers;A little help with bathing/dressing/bathroom;Assistance with cooking/housework;Assistance with feeding;Assist for transportation;Help with stairs or ramp for entrance   Equipment Recommendations  None recommended by PT    Recommendations for Other Services       Precautions / Restrictions Precautions Precautions: Fall Restrictions Weight Bearing Restrictions: No     Mobility  Bed Mobility Overal bed mobility: Needs Assistance Bed Mobility: Supine to Sit, Sit to Supine     Supine to sit: Modified independent (Device/Increase time) Sit to supine: Min assist   General bed mobility comments: assistance required to lift RLE into the bed. verbal cues for technique.    Transfers Overall transfer level: Needs assistance Equipment used: Rolling walker (2 wheels) Transfers: Sit to/from Stand Sit to Stand: Min guard           General transfer comment: Min guard for safety with verbal cues for safety    Ambulation/Gait Ambulation/Gait assistance: Min guard Gait Distance (Feet): 35 Feet Assistive device: Rolling walker (2 wheels) Gait Pattern/deviations: Step-to pattern, Step-through pattern, Decreased stance time - right, Antalgic Gait velocity: decreased     General Gait Details: verbal cues for technique, foot placement, and to increase step length. step to progressing to step through gait pattern. further ambulation deferred by the patient due to ongoing dizziness which is constant and does not worsen with functional activity.   Stairs             Wheelchair  Mobility    Modified Rankin (Stroke Patients Only)       Balance Overall  balance assessment: Needs assistance Sitting-balance support: Feet supported Sitting balance-Leahy Scale: Good     Standing balance support: Bilateral upper extremity supported, During functional activity Standing balance-Leahy Scale: Fair Standing balance comment: Min guard for safety                            Cognition Arousal/Alertness: Awake/alert Behavior During Therapy: WFL for tasks assessed/performed                                   General Comments: patient is able to follow single step commands consistently. increased time for processing required. she is not oriented to time (she thinks today is Tuesday).        Exercises      General Comments General comments (skin integrity, edema, etc.): R forehead with continued brusing; pt HR up to 125 seated on edge of bed BP 118/83 (MAP93) HR 82; following mobility 117/81 (MAP 93) HR 77.      Pertinent Vitals/Pain Pain Assessment Pain Assessment: Faces Faces Pain Scale: Hurts little more Pain Location: R groin/hip area Pain Descriptors / Indicators: Grimacing Pain Intervention(s): Limited activity within patient's tolerance, Ice applied, Monitored during session, Repositioned    Home Living                          Prior Function            PT Goals (current goals can now be found in the care plan section) Acute Rehab PT Goals Patient Stated Goal: to be able to go home PT Goal Formulation: With patient Time For Goal Achievement: 08/25/21 Potential to Achieve Goals: Good Progress towards PT goals: Progressing toward goals    Frequency    7X/week      PT Plan Discharge plan needs to be updated    Co-evaluation              AM-PAC PT "6 Clicks" Mobility   Outcome Measure  Help needed turning from your back to your side while in a flat bed without using bedrails?: None Help needed moving from lying on your back to sitting on the side of a flat bed without using  bedrails?: A Little Help needed moving to and from a bed to a chair (including a wheelchair)?: A Little Help needed standing up from a chair using your arms (e.g., wheelchair or bedside chair)?: A Little Help needed to walk in hospital room?: A Little Help needed climbing 3-5 steps with a railing? : Total 6 Click Score: 17    End of Session Equipment Utilized During Treatment: Gait belt Activity Tolerance: Patient limited by fatigue Patient left: in bed;with call bell/phone within reach;with bed alarm set Nurse Communication: Mobility status PT Visit Diagnosis: Unsteadiness on feet (R26.81);Other abnormalities of gait and mobility (R26.89);Repeated falls (R29.6);Muscle weakness (generalized) (M62.81);Difficulty in walking, not elsewhere classified (R26.2);Pain Pain - Right/Left: Right Pain - part of body: Hip     Time: 6222-9798 PT Time Calculation (min) (ACUTE ONLY): 14 min  Charges:  $Gait Training: 8-22 mins                     Minna Merritts, PT, MPT    Percell Locus 08/13/2021, 1:45 PM

## 2021-08-13 NOTE — Progress Notes (Signed)
[facility] Progress Note  Patient: Stephanie Patel    AJO:[id number]  DOA: 08/11/2021    Date of Service: the patient was seen and examined on 08/13/2021  Brief hospital course: 76 year old female past medical history of hypertension, pulmonary fibrosis/interstitial lung disease, rheumatoid arthritis and GERD presented to the emergency room on the early morning hours of 8/7 after an accidental fall.  No loss of consciousness.  Patient noted to have a small right frontal scalp laceration x-rays noting nondisplaced right superior and inferior pubic rami fractures.  Patient brought in for further evaluation.  Seen by PT and OT who recommended home health.  Pain relatively well controlled.  Initial plan was to discharge on 8/8 and while nursing was going over with patient discharge instructions, she started having some confusion and difficulty finding words.  Vital signs checked and were unremarkable.  No elevated blood pressures.  Rapid response evaluated patient.  Hospitalist attending also checked and evaluated patient.  Symptoms improved although by 20 minutes later, had not fully resolved and the patient was able to answer questions appropriately, she herself admitted that she had trouble getting the exact right words.  Code stroke called and patient went for stat head CT which was unremarkable.  Following head CT, patient completely back to normal.  Assessment and Plan: Assessment and Plan: * Dysarthria Unclear etiology.  Possible TIA?   Full stroke work-up with A1c, echo, Dopplers and lipid panel.   Placed on telemetry and monitor. --MRI brain today given persistent somnolence   8/9 - dysarthria resolved, ongoing somnolence  Closed fracture of multiple pubic rami, right, initial encounter (HCC) Pain moderately controlled on Vicodin.  Seen by PT and OT who are recommending home health.  In the process of setting up.  Discussed with orthopedic surgery and fracture will heal without  surgery.  Accidental fall According to patient daughters, patient has had a number of falls.  Home health PT and OT being set up.  Patient's daughters are also looking into a private nurse, which they were doing even before this hospitalization  Laceration of forehead Repaired in the emergency room.  Received Tdap.  Patient will follow-up with her PCP for staple removal  ILD (interstitial lung disease) ([place]) Continue nocturnal oxygen  Rheumatoid arthritis of multiple sites with negative rheumatoid factor ([place]) Pending med rec for continuation of Humira, methotrexate, leflunomide and Remicade  Depression Continue Wellbutrin and buspirone and Pristiq  GERD (gastroesophageal reflux disease) Continue Protonix  Irregular heart rhythm Cardiology consulted for assistance.  She appears in intermittent A-fib on telemetry but EKG today looks more like a sinus arrhythmia with P waves present but irregular rhythm.  Normal heart rate. -- Follow-up cardiology recommendations --Monitor on telemetry  HTN, goal below 140/80 Continue losartan and metoprolol.  Pain control to help with blood pressure control       Body mass index is 23.83 kg/m.        Consultants: None  Procedures: Echocardiogram --8/9 -LV hyperdynamic function with EF 70 to 75%, no LV regional WMA's, normal diastolic parameters, no significant valvular disease Carotid Dopplers -- 8/9 -no hemodynamically significant stenosis, irregular heart rhythm noted  Antimicrobials: None  Code Status: DNR   Subjective: Patient resting in bed with daughter at bedside when seen on rounds.  Patient is extremely sleepy today per daughter.  Her other daughter on speaker phone reports that patient often stays up very late at night and will tend to sleep during the day so not sure if this  is her baseline.  Patient reports ongoing pain in her head and pelvis since her fall.  Daughters reports patient has had 6 falls in about the past  month and a half and lives alone.  They are hopeful for therapy to reassess and for patient to go to rehab.  Patient has no other acute complaints at this time.  Objective: Vital signs were reviewed and unremarkable. Vitals:   08/13/21 1111 08/13/21 1509  BP: 107/84 121/88  Pulse: 72 81  Resp: 16 16  Temp: 97.7 F (36.5 C) (!) 97.5 F (36.4 C)  SpO2: 99% 100%   No intake or output data in the 24 hours ending 08/13/21 1934  Filed Weights   08/11/21 0131  Weight: 55.3 kg   Body mass index is 23.83 kg/m.  Exam:  General exam: Very drowsy but awakens to voice, no acute distress HEENT: Left forehead laceration with no bleeding and ecchymosis and left periorbital ecchymosis, moist mucus membranes, hearing grossly normal  Respiratory system: On room air, normal respiratory effort. Cardiovascular system: Irregular rhythm, regular rate, no pedal edema.   Gastrointestinal system: Abdomen soft nontender nondistended Central nervous system: no gross focal neurologic deficits, normal speech.  Otherwise exam limited by patient somnolence Extremities: No peripheral edema, normal tone Skin: dry, intact, normal temperature Psychiatry: normal mood, congruent affect, unable to assess judgment and insight due to somnolence   Data Reviewed: Labs notable for --glucose 106, creatinine 1.02 from 1.11, calcium 8.7, anion gap 3, albumin 3.0, GFR 57. Lipid profile notable for HDL 38, triglycerides 199.  CBC notable for stable hemoglobin 11.0.  Hemoglobin A1c 5.3% (average blood glucose 105)  Ultrasound bilateral carotids --minimal homogenous plaque with no hemodynamically significant stenosis by duplex criteria.  Irregular heart rhythm was noted.  MRI brain is pending  Disposition:  Status is: Observation     Anticipated discharge date: 8/10-11 pending SNF bed  Remaining issues to be resolved so that patient can be discharged: Stroke work-up, cardiology consult for ?new onset  A-fib   Family Communication: Updated daughter at bedside on rounds today   Author: Ezekiel Slocumb ,DO 08/13/2021 7:34 PM  To reach On-call, see care teams to locate the attending and reach out via [email or web address]. Between 7PM-7AM, please contact night-coverage If you still have difficulty reaching the attending provider, please page the [facility] (Director on Call) for [facility] on amion for assistance.

## 2021-08-13 NOTE — Plan of Care (Signed)

## 2021-08-13 NOTE — Progress Notes (Signed)
*  PRELIMINARY RESULTS* Echocardiogram 2D Echocardiogram has been performed.  Stephanie Patel 08/13/2021, 10:25 AM

## 2021-08-13 NOTE — NC FL2 (Signed)
[place] LEVEL OF CARE SCREENING TOOL     IDENTIFICATION  Patient Name: Stephanie Patel Birthdate: September 19, 1945 Sex: female Admission Date (Current Location): 08/11/2021  [facility] and [place] Number:  Engineering geologist and Address:  [facility], [address] [place], [place] [address]      Provider Number: [id number]  Attending Physician Name and Address:  Ezekiel Slocumb, DO  Relative Name and Phone Number:  Lelan Pons  [phone]    Current Level of Care: Hospital Recommended Level of Care: [place] Prior Approval Number:    Date Approved/Denied:   PASRR Number: [id number]  Discharge Plan: SNF    Current Diagnoses: Patient Active Problem List   Diagnosis Date Noted   Dysarthria 08/12/2021   Closed fracture of multiple pubic rami, right, initial encounter ([place]) 08/11/2021   Laceration of forehead 08/11/2021   Accidental fall 08/11/2021   Pelvic fracture ([place]) 08/11/2021   GIB (gastrointestinal bleeding) 09/19/2019   GERD (gastroesophageal reflux disease) 09/19/2019   Hypokalemia 09/19/2019   CAP (community acquired pneumonia) 11/08/2016   Right inguinal hernia 09/29/2016   HTN, goal below 140/80 12/26/2015   ILD (interstitial lung disease) ([place]) 03/28/2015   Acute respiratory failure with hypoxia ([place]) 03/18/2015   Closed torus fracture of distal end of right fibula 10/03/2013   Depression 08/21/2013   Environmental allergies 08/21/2013   IBS (irritable bowel syndrome) 08/21/2013   LVH (left ventricular hypertrophy) 08/21/2013   Osteoarthritis of knee 08/21/2013   Vitamin D deficiency, unspecified 08/21/2013   Rheumatoid arthritis of multiple sites with negative rheumatoid factor ([place]) 02/10/2012    Orientation RESPIRATION BLADDER Height & Weight     Self, Time, Situation, Place  Normal Continent, External catheter Weight: 55.3 kg Height:  5' (152.4 cm)  BEHAVIORAL SYMPTOMS/MOOD NEUROLOGICAL  BOWEL NUTRITION STATUS      Continent Diet (see dc summary)  AMBULATORY STATUS COMMUNICATION OF NEEDS Skin   Extensive Assist   Normal, Skin abrasions (forehead laceration)                       Personal Care Assistance Level of Assistance  Bathing, Feeding, Dressing Bathing Assistance: Limited assistance Feeding assistance: Independent Dressing Assistance: Maximum assistance     Functional Limitations Info             SPECIAL CARE FACTORS FREQUENCY  PT (By licensed PT), OT (By licensed OT)     PT Frequency: 5 times per week OT Frequency: 5 times per week            Contractures Contractures Info: Not present    Additional Factors Info  Code Status, Allergies Code Status Info: DNR Allergies Info: Keflex (Cephalexin), Peanut-containing Drug Products, Aricept (Donepezil), Codeine, Hydrochlorothiazide           Current Medications (08/13/2021):  This is the current hospital active medication list Current Facility-Administered Medications  Medication Dose Route Frequency Provider Last Rate Last Admin   acetaminophen (TYLENOL) tablet 650 mg  650 mg Oral Q6H PRN Athena Masse, MD       Or   acetaminophen (TYLENOL) suppository 650 mg  650 mg Rectal Q6H PRN Athena Masse, MD       aspirin EC tablet 81 mg  81 mg Oral Daily Annita Brod, MD   81 mg at 08/13/21 0901   enoxaparin (LOVENOX) injection 40 mg  40 mg Subcutaneous Q24H Athena Masse, MD   40 mg at  08/13/21 0900   HYDROcodone-acetaminophen (NORCO/VICODIN) 5-325 MG per tablet 1-2 tablet  1-2 tablet Oral Q4H PRN Athena Masse, MD   2 tablet at 08/13/21 0548   morphine (PF) 2 MG/ML injection 2 mg  2 mg Intravenous Q2H PRN Athena Masse, MD       ondansetron [facility]) tablet 4 mg  4 mg Oral Q6H PRN Athena Masse, MD       Or   ondansetron [facility]) injection 4 mg  4 mg Intravenous Q6H PRN Athena Masse, MD         Discharge Medications: Please see discharge summary for a list of discharge  medications.  Relevant Imaging Results:  Relevant Lab Results:   Additional Information SS3 [id number]  Conception Oms, RN

## 2021-08-13 NOTE — Consult Note (Signed)
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 failure or acute coronary syndrome  Plan: 1.  Further supportive care of fall and injury 2.  Continue telemetry for further evaluation of the possibility of atrial fibrillation as a cause of above and/or the possibility of presyncope 3.  Carotid Doppler to assess for atherosclerosis or other causes of TIA type symptoms 4.  If further concerns of rhythm disturbances occur in the near future will consider the possibility of outpatient monitor 5.  No further cardiac diagnostics necessary at this time 6.  Further workup from the neurologic standpoint and please call if further questions or further evaluation needed  Signed, Corey Skains M.D. [facility] 08/13/2021, 1:30 PM

## 2021-08-13 NOTE — Progress Notes (Signed)
Occupational Therapy Treatment Patient Details Name: Stephanie Patel MRN: [id number] DOB: 10-02-1945 Today's Date: 08/13/2021   History of present illness Stephanie Patel is a 76 y.o. female with medical history significant for HTN, GERD, depression, rheumatoid arthritis, interstitial lung disease/pulmonary fibrosis, IBS who presents to the ED with right hip pain following an accidental fall after she walked into a door.  She also sustained a laceration to her forehead.  ED course and data review: Vitals within normal limits except for slightly elevated blood pressure of 141/76.  Labs with WBC 14,000 and otherwise normal CBC and BMP with creatinine 1.17 which is above most recent baseline of 0.9 on 02/2021.  Imaging: CT head showed a small right frontal scalp laceration with no acute intracranial abnormality or skull fracture  CT C-spine showing multilevel degenerative disc disease without acute fracture  X-rays hip shows essentially nondisplaced right superior and inferior multiple pubic rami fractures at the pubic body.   OT comments  Chart reviewed, pt greeted in room with daughter present. Pt is alert and oriented x4 however with increased processing time required and fair safety awareness. Pt reports she continues to feel "off". Pt daughter reports she feels pt is not quite at baseline cognitively. Pt performs bed mobility with MOD I, functional mobility in room with RW to bathroom with supervision-CGA.  Pt performs toileting with supervision. Education provided re: DME use, home safety, falls prevention. Pt is left in bed, NAD, all needs met. OT will continue to follow acutely.   Recommendations for follow up therapy are one component of a multi-disciplinary discharge planning process, led by the attending physician.  Recommendations may be updated based on patient status, additional functional criteria and insurance authorization.    Follow Up Recommendations  Home health OT    Assistance  Recommended at Discharge Frequent or constant Supervision/Assistance  Patient can return home with the following  A little help with walking and/or transfers;A little help with bathing/dressing/bathroom;Assistance with cooking/housework;Help with stairs or ramp for entrance;Assist for transportation   Equipment Recommendations  BSC/3in1;Tub/shower seat    Recommendations for Other Services      Precautions / Restrictions Precautions Precautions: Fall Restrictions Weight Bearing Restrictions: No       Mobility Bed Mobility Overal bed mobility: Modified Independent                  Transfers Overall transfer level: Needs assistance Equipment used: Rolling walker (2 wheels) Transfers: Sit to/from Stand Sit to Stand: Supervision                 Balance Overall balance assessment: Needs assistance Sitting-balance support: Feet supported Sitting balance-Leahy Scale: Good     Standing balance support: Bilateral upper extremity supported, During functional activity Standing balance-Leahy Scale: Fair                             ADL either performed or assessed with clinical judgement   ADL Overall ADL's : Needs assistance/impaired     Grooming: Wash/dry hands;Min guard;Standing               Lower Body Dressing: Sit to/from stand;Min guard   Toilet Transfer: Supervision/safety;Ambulation;Rolling walker (2 wheels)   Toileting- Clothing Manipulation and Hygiene: Supervision/safety       Functional mobility during ADLs: Supervision/safety;Rolling walker (2 wheels)      Extremity/Trunk Assessment              Vision  Perception     Praxis      Cognition Arousal/Alertness: Awake/alert Behavior During Therapy: WFL for tasks assessed/performed Overall Cognitive Status: Within Functional Limits for tasks assessed                                          Exercises      Shoulder Instructions        General Comments R forehead with continued brusing; pt HR up to 125 seated on edge of bed BP 118/83 (MAP93) HR 82; following mobility 117/81 (MAP 93) HR 77.    Pertinent Vitals/ Pain       Pain Assessment Pain Assessment: Faces Faces Pain Scale: Hurts little more Pain Location: R hip Pain Descriptors / Indicators: Grimacing Pain Intervention(s): Limited activity within patient's tolerance, Monitored during session, Repositioned  Home Living                                          Prior Functioning/Environment              Frequency  Min 2X/week        Progress Toward Goals  OT Goals(current goals can now be found in the care plan section)  Progress towards OT goals: Progressing toward goals     Plan Discharge plan remains appropriate    Co-evaluation                 AM-PAC OT "6 Clicks" Daily Activity     Outcome Measure   Help from another person eating meals?: None Help from another person taking care of personal grooming?: A Little Help from another person toileting, which includes using toliet, bedpan, or urinal?: None Help from another person bathing (including washing, rinsing, drying)?: A Little Help from another person to put on and taking off regular upper body clothing?: None Help from another person to put on and taking off regular lower body clothing?: A Little 6 Click Score: 21    End of Session Equipment Utilized During Treatment: Rolling walker (2 wheels)  OT Visit Diagnosis: Unsteadiness on feet (R26.81);Muscle weakness (generalized) (M62.81);History of falling (Z91.81)   Activity Tolerance Patient tolerated treatment well   Patient Left in bed;with call bell/phone within reach;with bed alarm set   Nurse Communication Mobility status;Patient requests pain meds        Time: 1041-1106 OT Time Calculation (min): 25 min  Charges: OT General Charges $OT Visit: 1 Visit OT Treatments $Self Care/Home Management :  23-37 mins  Stephanie Patel, OTD OTR/L  08/13/21, 12:27 PM

## 2021-08-13 NOTE — TOC Progression Note (Signed)
Transition of Care [facility]) - Progression Note    Patient Details  Name: Stephanie Patel MRN: [id number] Date of Birth: 08-Dec-1945  Transition of Care [facility]) CM/SW [place], RN Phone Number: 08/13/2021, 3:04 PM  Clinical Narrative:    Patient will need STR SNF Bedsearch sent, FL2 completed, Bedsearch sent, will review bed offers once obtained   Expected Discharge Plan: [place] Barriers to Discharge: Barriers Resolved  Expected Discharge Plan and Services Expected Discharge Plan: [place]   Discharge Planning Services: CM Consult   Living arrangements for the past 2 months: [place] Expected Discharge Date: 08/12/21                 DME Agency: NA       HH Arranged: PT, OT HH Agency: [place] Date [facility] Agency Contacted: 08/11/21 Time [place]: [id number] Representative spoke with at [place]: [place] Determinants of Health ([place]) Interventions    Readmission Risk Interventions     No data to display

## 2021-08-13 NOTE — Assessment & Plan Note (Addendum)
Cardiology consulted for assistance.  Patient has been sinus rhythm with PACs and intermittent multifocal atrial tachycardia. No overt A-fib was seen on telemetry. --Consider ambulatory heart monitor if pt continues having episodes --Continue metoprolol

## 2021-08-14 DIAGNOSIS — S32501A Unspecified fracture of right pubis, initial encounter for closed fracture: Secondary | ICD-10-CM | POA: Diagnosis not present

## 2021-08-14 DIAGNOSIS — R471 Dysarthria and anarthria: Secondary | ICD-10-CM | POA: Diagnosis not present

## 2021-08-14 MED ORDER — DICYCLOMINE HCL 20 MG PO TABS
20.0000 mg | ORAL_TABLET | Freq: Four times a day (QID) | ORAL | Status: DC
  Administered 2021-08-14 – 2021-08-15 (×4): 20 mg via ORAL
  Filled 2021-08-14 (×4): qty 1

## 2021-08-14 MED ORDER — FLUTICASONE PROPIONATE 50 MCG/ACT NA SUSP
2.0000 | Freq: Every day | NASAL | Status: DC
  Administered 2021-08-14 – 2021-08-15 (×2): 2 via NASAL
  Filled 2021-08-14: qty 16

## 2021-08-14 MED ORDER — PREDNISONE 10 MG PO TABS
5.0000 mg | ORAL_TABLET | Freq: Every day | ORAL | Status: DC
  Administered 2021-08-14 – 2021-08-15 (×2): 5 mg via ORAL
  Filled 2021-08-14 (×2): qty 1

## 2021-08-14 MED ORDER — LORATADINE 10 MG PO TABS
10.0000 mg | ORAL_TABLET | Freq: Every evening | ORAL | Status: DC
  Administered 2021-08-14: 10 mg via ORAL
  Filled 2021-08-14: qty 1

## 2021-08-14 MED ORDER — RISPERIDONE 0.5 MG PO TABS
0.5000 mg | ORAL_TABLET | Freq: Every day | ORAL | Status: DC
Start: 2021-08-14 — End: 2021-08-15
  Administered 2021-08-14: 0.5 mg via ORAL
  Filled 2021-08-14: qty 1

## 2021-08-14 MED ORDER — MIRTAZAPINE 15 MG PO TABS
30.0000 mg | ORAL_TABLET | Freq: Every day | ORAL | Status: DC
Start: 2021-08-14 — End: 2021-08-15
  Administered 2021-08-14: 30 mg via ORAL
  Filled 2021-08-14: qty 2

## 2021-08-14 MED ORDER — BUSPIRONE HCL 10 MG PO TABS
15.0000 mg | ORAL_TABLET | Freq: Two times a day (BID) | ORAL | Status: DC
  Administered 2021-08-14 – 2021-08-15 (×2): 15 mg via ORAL
  Filled 2021-08-14 (×2): qty 2

## 2021-08-14 MED ORDER — HYOSCYAMINE SULFATE 0.125 MG SL SUBL
0.1250 mg | SUBLINGUAL_TABLET | Freq: Two times a day (BID) | SUBLINGUAL | Status: DC
  Administered 2021-08-14 – 2021-08-15 (×2): 0.125 mg via SUBLINGUAL
  Filled 2021-08-14 (×2): qty 1

## 2021-08-14 MED ORDER — BUPROPION HCL ER (XL) 150 MG PO TB24
300.0000 mg | ORAL_TABLET | Freq: Every day | ORAL | Status: DC
Start: 2021-08-14 — End: 2021-08-15
  Administered 2021-08-14 – 2021-08-15 (×2): 300 mg via ORAL
  Filled 2021-08-14 (×2): qty 2

## 2021-08-14 MED ORDER — FESOTERODINE FUMARATE ER 4 MG PO TB24
4.0000 mg | ORAL_TABLET | Freq: Every day | ORAL | Status: DC
  Administered 2021-08-14 – 2021-08-15 (×2): 4 mg via ORAL
  Filled 2021-08-14 (×2): qty 1

## 2021-08-14 MED ORDER — MEMANTINE HCL 5 MG PO TABS
10.0000 mg | ORAL_TABLET | Freq: Two times a day (BID) | ORAL | Status: DC
  Administered 2021-08-14 – 2021-08-15 (×2): 10 mg via ORAL
  Filled 2021-08-14 (×2): qty 2

## 2021-08-14 MED ORDER — RISAQUAD PO CAPS
1.0000 | ORAL_CAPSULE | Freq: Every day | ORAL | Status: DC
  Administered 2021-08-14 – 2021-08-15 (×2): 1 via ORAL
  Filled 2021-08-14 (×2): qty 1

## 2021-08-14 MED ORDER — PANTOPRAZOLE SODIUM 40 MG PO TBEC
40.0000 mg | DELAYED_RELEASE_TABLET | Freq: Two times a day (BID) | ORAL | Status: DC
  Administered 2021-08-14 – 2021-08-15 (×2): 40 mg via ORAL
  Filled 2021-08-14 (×2): qty 1

## 2021-08-14 MED ORDER — MESALAMINE 1.2 G PO TBEC
2.4000 g | DELAYED_RELEASE_TABLET | Freq: Every day | ORAL | Status: DC
  Administered 2021-08-14 – 2021-08-15 (×2): 2.4 g via ORAL
  Filled 2021-08-14 (×2): qty 2

## 2021-08-14 MED ORDER — METOPROLOL TARTRATE 25 MG PO TABS
12.5000 mg | ORAL_TABLET | Freq: Two times a day (BID) | ORAL | Status: DC
  Administered 2021-08-14 – 2021-08-15 (×3): 12.5 mg via ORAL
  Filled 2021-08-14 (×3): qty 1

## 2021-08-14 NOTE — TOC Progression Note (Signed)
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 and would like a call to let her know when approved and when the DC will be   Expected Discharge Plan: [place] Barriers to Discharge: Barriers Resolved  Expected Discharge Plan and Services Expected Discharge Plan: [place]   Discharge Planning Services: CM Consult   Living arrangements for the past 2 months: [place] Expected Discharge Date: 08/12/21                 DME Agency: NA       HH Arranged: PT, OT HH Agency: [place] Date [facility] Agency Contacted: 08/11/21 Time [place]: [phone] Representative spoke with at [place]: [place] Determinants of Health ([place]) Interventions    Readmission Risk Interventions     No data to display

## 2021-08-14 NOTE — Plan of Care (Signed)

## 2021-08-14 NOTE — TOC Progression Note (Signed)
Transition of Care [facility]) - Progression Note    Patient Details  Name: Stephanie Patel MRN: [id number] Date of Birth: [birth year]  Transition of Care [facility]) CM/SW [place], RN Phone Number: 08/14/2021, 2:26 PM  Clinical Narrative:    Spoke with the patient in the room at the bedside concerning the recommendation to go to [facility] SNF She is very tired and groggy and stated that she is not sure what to do, I asked her if she would like me to call her daughter she stated that she would, I called Debora at [phone] and spoke with the daughter Neoma Laming and she asked to be 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   Expected Discharge Plan: [place] Barriers to Discharge: Barriers Resolved  Expected Discharge Plan and Services Expected Discharge Plan: [place]   Discharge Planning Services: CM Consult   Living arrangements for the past 2 months: [place] Expected Discharge Date: 08/12/21                 DME Agency: NA       HH Arranged: PT, OT HH Agency: [place] Date [facility] Agency Contacted: 08/11/21 Time [place]: [phone] Representative spoke with at [place]: [place] Determinants of Health ([place]) Interventions    Readmission Risk Interventions     No data to display

## 2021-08-14 NOTE — Plan of Care (Signed)

## 2021-08-14 NOTE — Progress Notes (Signed)
Physical Therapy Treatment Patient Details Name: Stephanie Patel MRN: [id number] DOB: 03-07-1945 Today's Date: 08/14/2021   History of Present Illness Stephanie Patel is a 76 y.o. female with medical history significant for HTN, GERD, depression, rheumatoid arthritis, interstitial lung disease/pulmonary fibrosis, IBS who presents to the ED with right hip pain following an accidental fall after she walked into a door.  She also sustained a laceration to her forehead.  ED course and data review: Vitals within normal limits except for slightly elevated blood pressure of 141/76.  Labs with WBC 14,000 and otherwise normal CBC and BMP with creatinine 1.17 which is above most recent baseline of 0.9 on 02/2021.  Imaging: CT head showed a small right frontal scalp laceration with no acute intracranial abnormality or skull fracture  CT C-spine showing multilevel degenerative disc disease without acute fracture  X-rays hip shows essentially nondisplaced right superior and inferior multiple pubic rami fractures at the pubic body.    PT Comments    The patient is agreeable to PT. She has confusion about recent events and was unaware that she is in [place] at the hospital. She is reoriented easily and follows commands consistently. She needs Min guard for standing and ambulating with rolling walker with cues needed for safety. Further ambulation stopped by therapist due to heart rate elevated into the 140's with walking. The patient is not at her baseline level of functional mobility. Recommend PT follow up to maximize independence and facilitate return to prior level of function. SNF recommended at discharge.    Recommendations for follow up therapy are one component of a multi-disciplinary discharge planning process, led by the attending physician.  Recommendations may be updated based on patient status, additional functional criteria and insurance authorization.  Follow Up Recommendations  Skilled nursing-short  term rehab (<3 hours/day) Can patient physically be transported by private vehicle: Yes   Assistance Recommended at Discharge Intermittent Supervision/Assistance  Patient can return home with the following A little help with walking and/or transfers;A little help with bathing/dressing/bathroom;Assistance with cooking/housework;Assistance with feeding;Assist for transportation;Help with stairs or ramp for entrance   Equipment Recommendations  None recommended by PT    Recommendations for Other Services       Precautions / Restrictions Precautions Precautions: Fall Restrictions Weight Bearing Restrictions: No     Mobility  Bed Mobility               General bed mobility comments: not addressed as patient sitting up on arrival and post session    Transfers Overall transfer level: Needs assistance Equipment used: Rolling walker (2 wheels) Transfers: Sit to/from Stand Sit to Stand: Min guard           General transfer comment: verbal cues for hand placement    Ambulation/Gait Ambulation/Gait assistance: Min guard Gait Distance (Feet): 30 Feet Assistive device: Rolling walker (2 wheels) Gait Pattern/deviations: Step-to pattern, Decreased stance time - right, Antalgic Gait velocity: decreased     General Gait Details: verbal cues for initiating steps initially and cues to keep rolling walker closer to base of support. mild dizziness is reported with activity but seems improved compared to yesterday. further ambulation deferred due to elevated heart rate in the 140's, nursing aware.   Stairs             Wheelchair Mobility    Modified Rankin (Stroke Patients Only)       Balance Overall balance assessment: Needs assistance Sitting-balance support: Feet supported Sitting balance-Leahy Scale: Good  Standing balance support: Bilateral upper extremity supported Standing balance-Leahy Scale: Fair Standing balance comment: Min guard for safety                             Cognition Arousal/Alertness: Awake/alert Behavior During Therapy: WFL for tasks assessed/performed Overall Cognitive Status: Impaired/Different from baseline Area of Impairment: Safety/judgement, Memory, Orientation                 Orientation Level: Disoriented to, Time, Place   Memory: Decreased recall of precautions, Decreased short-term memory   Safety/Judgement: Decreased awareness of safety, Decreased awareness of deficits     General Comments: patient is able to follow single step commands consistently        Exercises      General Comments        Pertinent Vitals/Pain Pain Assessment Pain Assessment: Faces Faces Pain Scale: Hurts little more Pain Location: R groin/hip area Pain Descriptors / Indicators: Guarding Pain Intervention(s): Limited activity within patient's tolerance    Home Living                          Prior Function            PT Goals (current goals can now be found in the care plan section) Acute Rehab PT Goals Patient Stated Goal: to feel better PT Goal Formulation: With patient Time For Goal Achievement: 08/25/21 Potential to Achieve Goals: Good Progress towards PT goals: Progressing toward goals    Frequency    7X/week      PT Plan Current plan remains appropriate    Co-evaluation              AM-PAC PT "6 Clicks" Mobility   Outcome Measure  Help needed turning from your back to your side while in a flat bed without using bedrails?: None Help needed moving from lying on your back to sitting on the side of a flat bed without using bedrails?: A Little Help needed moving to and from a bed to a chair (including a wheelchair)?: A Little Help needed standing up from a chair using your arms (e.g., wheelchair or bedside chair)?: A Little Help needed to walk in hospital room?: A Little Help needed climbing 3-5 steps with a railing? : Total 6 Click Score: 17    End of  Session   Activity Tolerance: Patient limited by fatigue Patient left: in chair;with call bell/phone within reach;with chair alarm set Nurse Communication: Mobility status (elevated heart rate) PT Visit Diagnosis: Unsteadiness on feet (R26.81);Other abnormalities of gait and mobility (R26.89);Repeated falls (R29.6);Muscle weakness (generalized) (M62.81);Difficulty in walking, not elsewhere classified (R26.2);Pain Pain - Right/Left: Right Pain - part of body: Hip     Time: 4315-4008 PT Time Calculation (min) (ACUTE ONLY): 21 min  Charges:  $Gait Training: 8-22 mins                    Minna Merritts, PT, MPT    Percell Locus 08/14/2021, 3:10 PM

## 2021-08-14 NOTE — Assessment & Plan Note (Addendum)
No acute issues.   --Continue [place]

## 2021-08-14 NOTE — Progress Notes (Signed)
[facility]iology [facility] Encounter Note  Patient: Stephanie Patel / Admit Date: 08/11/2021 / Date of Encounter: 08/14/2021, 12:49 PM   Subjective: Patient is overall doing better today than yesterday.  She feels much better although still is concerned about her unsteadiness and the reasons for her fall.  She has not had any neurologic abnormalities at this time and there is no evidence of stroke by CT scan at this point.  Further review of her telemetry has shown that yesterday she had multiple and times in which she had had frequent preatrial contractions as well as some multifocal atrial tachycardia.  There was no evidence of overt atrial fibrillation.  The patient does have normal blood pressure at this time and does not require additional medication management but would consider continuation of telemetry to assess the need for additional medication management including metoprolol.  Echocardiogram has shown hyperdynamic LV function with no evidence of significant valvular heart disease.  Review of Systems: Positive for: Facial bruising Negative for: Vision change, hearing change, syncope, dizziness, nausea, vomiting,diarrhea, bloody stool, stomach pain, cough, congestion, diaphoresis, urinary frequency, urinary pain,skin lesions, skin rashes Others previously listed  Objective: Telemetry: Normal sinus rhythm Physical Exam: Blood pressure 118/87, pulse 70, temperature 98 F (36.7 C), resp. rate 16, height 5' (1.524 m), weight 55.3 kg, SpO2 95 %. Body mass index is 23.83 kg/m. General: Well developed, well nourished, in no acute distress. Head: Normocephalic, atraumatic, sclera non-icteric, no xanthomas, nares are without discharge. Neck: No apparent masses Lungs: Normal respirations with no wheezes, no rhonchi, no rales , no crackles   Heart: Regular rate and rhythm, normal S1 S2, no murmur, no rub, no gallop, PMI is normal size and placement, carotid upstroke normal without bruit,  jugular venous pressure normal Abdomen: Soft, non-tender, non-distended with normoactive bowel sounds. No hepatosplenomegaly. Abdominal aorta is normal size without bruit Extremities: No edema, no clubbing, no cyanosis, no ulcers,  Peripheral: 2+ radial, 2+ femoral, 2+ dorsal pedal pulses Neuro: Alert and oriented. Moves all extremities spontaneously. Psych:  Responds to questions appropriately with a normal affect.  No intake or output data in the 24 hours ending 08/14/21 1249  Inpatient Medications:  . aspirin EC  81 mg Oral Daily  . enoxaparin (LOVENOX) injection  40 mg Subcutaneous Q24H   Infusions:   Labs: Recent Labs    08/12/21 0604 08/13/21 0630  NA 141 139  K 4.5 4.0  CL 110 108  CO2 27 28  GLUCOSE 102* 106*  BUN 18 17  CREATININE 1.11* 1.02*  CALCIUM 8.7* 8.7*   Recent Labs    08/13/21 0630  AST 21  ALT 17  ALKPHOS 58  BILITOT 0.5  PROT 6.1*  ALBUMIN 3.0*   Recent Labs    08/12/21 0604 08/13/21 0630  WBC 6.2 7.0  HGB 11.5* 11.0*  HCT 35.6* 33.6*  MCV 98.9 98.2  PLT 179 187   No results for input(s): "CKTOTAL", "CKMB", "TROPONINI" in the last 72 hours. Invalid input(s): "POCBNP" Recent Labs    08/12/21 2023  HGBA1C 5.3     Weights: Filed Weights   08/11/21 0131  Weight: 55.3 kg     Radiology/Studies:  MR BRAIN WO CONTRAST  Result Date: 08/14/2021 CLINICAL DATA:  Initial evaluation for mental status change, unknown cause. EXAM: MRI HEAD WITHOUT CONTRAST TECHNIQUE: Multiplanar, multiecho pulse sequences of the brain and surrounding structures were obtained without intravenous contrast. COMPARISON:  Prior CT from 08/12/2021 as well as prior brain MRI from 12/12/2018.  FINDINGS: Brain: Mild age-related cerebral atrophy. Patchy and confluent T2/FLAIR hyperintensity involving the periventricular and deep white matter both cerebral hemispheres as well as the pons, most consistent with chronic small vessel ischemic disease, moderately advanced in  nature, and progressed as compared to most recent MRI from 12/12/2018. No abnormal foci of restricted diffusion to suggest acute or subacute ischemia. Gray-white matter differentiation maintained. No areas of chronic cortical infarction. No acute intracranial hemorrhage. 1.1 cm linear focus of susceptibility artifact noted involving the left frontal operculum (series 13, image 35), consistent with a small focus of chronic hemorrhage. Few additional punctate chronic micro hemorrhages noted within the contralateral right cerebral hemisphere. No mass lesion, midline shift or mass effect. No hydrocephalus or extra-axial fluid collection. Pituitary gland and suprasellar region within normal limits. Vascular: Major intracranial vascular flow voids are maintained. Skull and upper cervical spine: Craniocervical junction within normal limits. Bone marrow signal intensity normal. Small evolving right frontal scalp contusion noted. Sinuses/Orbits: Prior bilateral ocular lens replacement. Globes orbital soft tissues demonstrate no other acute finding. Scattered mucosal thickening noted about the right greater than left ethmoidal air cells. No mastoid effusion. Other: None. IMPRESSION: 1. No acute intracranial abnormality. 2. Age-related cerebral atrophy with moderate chronic microvascular ischemic disease, progressed as compared to most recent MRI from 12/12/2018. 3. Small evolving right frontal scalp contusion. Electronically Signed   By: Jeannine Boga M.D.   On: 08/14/2021 04:33   ECHOCARDIOGRAM COMPLETE  Result Date: 08/13/2021    ECHOCARDIOGRAM REPORT   Patient Name:   Stephanie Patel Date of Exam: 08/13/2021 Medical Rec #:  [id number]      Height:       60.0 in Accession #:    [id number]     Weight:       122.0 lb Date of Birth:  Oct 09, 1945      BSA:          1.513 m Patient Age:    76 years       BP:           128/86 mmHg Patient Gender: F              HR:           97 bpm. Exam Location:  [facility] Procedure: 2D Echo,  Cardiac Doppler and Color Doppler Indications:     TIA G45.9  History:         Patient has prior history of Echocardiogram examinations, most                  recent 02/14/2020. Signs/Symptoms:Murmur; Risk                  Factors:Hypertension. LVH, pulmonary fibrosis.  Sonographer:     Sherrie Sport Referring Phys:  [place] Diagnosing Phys: Serafina Royals MD  Sonographer Comments: Suboptimal parasternal window and suboptimal apical window. IMPRESSIONS  1. Left ventricular ejection fraction, by estimation, is 70 to 75%. The left ventricle has hyperdynamic function. The left ventricle has no regional wall motion abnormalities. Left ventricular diastolic parameters were normal.  2. Right ventricular systolic function is normal. The right ventricular size is normal.  3. The mitral valve is normal in structure. Trivial mitral valve regurgitation.  4. The aortic valve is normal in structure. Aortic valve regurgitation is not visualized. FINDINGS  Left Ventricle: Left ventricular ejection fraction, by estimation, is 70 to 75%. The left ventricle has hyperdynamic function. The left ventricle has no regional wall motion abnormalities.  The left ventricular internal cavity size was small. There is no  left ventricular hypertrophy. Left ventricular diastolic parameters were normal. Right Ventricle: The right ventricular size is normal. No increase in right ventricular wall thickness. Right ventricular systolic function is normal. Left Atrium: Left atrial size was normal in size. Right Atrium: Right atrial size was normal in size. Pericardium: There is no evidence of pericardial effusion. Mitral Valve: The mitral valve is normal in structure. Trivial mitral valve regurgitation. Tricuspid Valve: The tricuspid valve is normal in structure. Tricuspid valve regurgitation is trivial. Aortic Valve: The aortic valve is normal in structure. Aortic valve regurgitation is not visualized. Aortic valve mean gradient measures 5.0  mmHg. Aortic valve peak gradient measures 11.3 mmHg. Aortic valve area, by VTI measures 2.85 cm. Pulmonic Valve: The pulmonic valve was normal in structure. Pulmonic valve regurgitation is not visualized. Aorta: The aortic root and ascending aorta are structurally normal, with no evidence of dilitation. IAS/Shunts: No atrial level shunt detected by color flow Doppler.  LEFT VENTRICLE PLAX 2D LVIDd:         3.60 cm   Diastology LVIDs:         2.40 cm   LV e' medial:    3.05 cm/s LV PW:         1.30 cm   LV E/e' medial:  19.4 LV IVS:        1.10 cm   LV e' lateral:   5.11 cm/s LVOT diam:     2.00 cm   LV E/e' lateral: 11.6 LV SV:         56 LV SV Index:   37 LVOT Area:     3.14 cm  RIGHT VENTRICLE RV Basal diam:  2.60 cm RV S prime:     12.40 cm/s TAPSE (M-mode): 1.2 cm LEFT ATRIUM             Index        RIGHT ATRIUM           Index LA diam:        3.40 cm 2.25 cm/m   RA Area:     17.10 cm LA Vol (A2C):   41.6 ml 27.50 ml/m  RA Volume:   44.20 ml  29.21 ml/m LA Vol (A4C):   34.7 ml 22.94 ml/m LA Biplane Vol: 38.3 ml 25.32 ml/m  AORTIC VALVE AV Area (Vmax):    2.00 cm AV Area (Vmean):   2.23 cm AV Area (VTI):     2.85 cm AV Vmax:           168.00 cm/s AV Vmean:          98.700 cm/s AV VTI:            0.197 m AV Peak Grad:      11.3 mmHg AV Mean Grad:      5.0 mmHg LVOT Vmax:         107.00 cm/s LVOT Vmean:        70.100 cm/s LVOT VTI:          0.179 m LVOT/AV VTI ratio: 0.91  AORTA Ao Root diam: 3.43 cm MITRAL VALVE               TRICUSPID VALVE MV Area (PHT): 3.21 cm    TR Peak grad:   13.8 mmHg MV Decel Time: 236 msec    TR Vmax:        186.00 cm/s MV E velocity: 59.10 cm/s MV  A velocity: 82.30 cm/s  SHUNTS MV E/A ratio:  0.72        Systemic VTI:  0.18 m                            Systemic Diam: 2.00 cm Serafina Royals MD Electronically signed by Serafina Royals MD Signature Date/Time: 08/13/2021/12:44:50 PM    Final    US Carotid Bilateral  Result Date: 08/13/2021 CLINICAL DATA:  76 year old female  with a history of TIA EXAM: BILATERAL CAROTID DUPLEX ULTRASOUND TECHNIQUE: Pearline Cables scale imaging, color Doppler and duplex ultrasound were performed of bilateral carotid and vertebral arteries in the neck. COMPARISON:  None Available. FINDINGS: Criteria: Quantification of carotid stenosis is based on velocity parameters that correlate the residual internal carotid diameter with NASCET-based stenosis levels, using the diameter of the distal internal carotid lumen as the denominator for stenosis measurement. The following velocity measurements were obtained: RIGHT ICA:  Systolic 73 cm/sec, Diastolic 23 cm/sec CCA:  60 cm/sec SYSTOLIC ICA/CCA RATIO:  1.2 ECA:  32 cm/sec LEFT ICA:  Systolic 67 cm/sec, Diastolic 26 cm/sec CCA:  41 cm/sec SYSTOLIC ICA/CCA RATIO:  1.6 ECA:  35 cm/sec Right Brachial SBP: Not acquired Left Brachial SBP: Not acquired RIGHT CAROTID ARTERY: No significant calcified disease of the right common carotid artery. Intermediate waveform maintained. Homogeneous plaque without significant calcifications at the right carotid bifurcation. Low resistance waveform of the right ICA. No significant tortuosity. RIGHT VERTEBRAL ARTERY: Antegrade flow with low resistance waveform. LEFT CAROTID ARTERY: No significant calcified disease of the left common carotid artery. Intermediate waveform maintained. Homogeneous plaque at the left carotid bifurcation without significant calcifications. Low resistance waveform of the left ICA. LEFT VERTEBRAL ARTERY:  Antegrade flow with low resistance waveform. Additional: Irregular rhythm identified during the exam. IMPRESSION: Color duplex indicates minimal homogeneous plaque, with no hemodynamically significant stenosis by duplex criteria in the extracranial cerebrovascular circulation. Irregular heart rhythm identified during the exam. Correlation with any history of atrial fibrillation and/or EKG may be useful. Signed, Dulcy Fanny. Nadene Rubins, RPVI [facility] Electronically Signed   By: Corrie Mckusick D.O.   On: 08/13/2021 08:26   CT HEAD WO CONTRAST (5MM)  Result Date: 08/12/2021 CLINICAL DATA:  Neuro deficit, acute, stroke suspected sudden onset confusion, dysarthric speech EXAM: CT HEAD WITHOUT CONTRAST TECHNIQUE: Contiguous axial images were obtained from the base of the skull through the vertex without intravenous contrast. RADIATION DOSE REDUCTION: This exam was performed according to the departmental dose-optimization program which includes automated exposure control, adjustment of the mA and/or kV according to patient size and/or use of iterative reconstruction technique. COMPARISON:  Head CT 08/11/2021 FINDINGS: Brain: No evidence of acute intracranial hemorrhage or extra-axial collection.Patent basal cisterns.The ventricles are unchanged in size.Confluent periventricular and subcortical white matter hypoattenuation, which is nonspecific but likely sequela of chronic small vessel ischemic disease.Mild cerebral atrophy Vascular: No hyperdense vessel Skull: Negative for skull fracture. Sinuses/Orbits: Mild ethmoid air cell mucosal thickening. Orbits are unremarkable. Other: Right frontal scalp laceration. IMPRESSION: No acute intracranial abnormality. Unchanged sequela of chronic small vessel ischemic disease and cerebral atrophy. Right frontal scalp laceration. Electronically Signed   By: Maurine Simmering M.D.   On: 08/12/2021 15:58   DG Chest Portable 1 View  Result Date: 08/11/2021 CLINICAL DATA:  Fall, hypoxia EXAM: PORTABLE CHEST 1 VIEW COMPARISON:  None Available. FINDINGS: Stable elevation of the right hemidiaphragm. Mild bibasilar atelectasis or scarring.  No confluent pulmonary infiltrate. No pneumothorax or pleural effusion. Cardiac size within normal limits. Pulmonary vascularity is normal. 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 No acute  bone abnormality. IMPRESSION: 1. No acute cardiopulmonary disease. 2. Stable elevation of the right hemidiaphragm. Electronically Signed   By: Fidela Salisbury M.D.   On: 08/11/2021 05:01   DG HIP UNILAT WITH PELVIS 2-3 VIEWS RIGHT  Result Date: 08/11/2021 CLINICAL DATA:  Fall today with right hip pain. EXAM: DG HIP (WITH OR WITHOUT PELVIS) 2-3V RIGHT COMPARISON:  None Available. FINDINGS: Essentially nondisplaced fracture of the right superior and inferior pubic rami is at the pubic body. There is no definite symphyseal involvement. No fracture of the right acetabulum or proximal femur. The femoral head is well seated in the acetabulum. No sacroiliac joint widening. IMPRESSION: Essentially nondisplaced right superior and inferior pubic rami fractures at the pubic body. Electronically Signed   By: Keith Rake M.D.   On: 08/11/2021 02:10   CT Cervical Spine Wo Contrast  Result Date: 08/11/2021 CLINICAL DATA:  Neck trauma (Age >= 65y) Patient walked into a door while getting up to use the restroom. EXAM: CT CERVICAL SPINE WITHOUT CONTRAST TECHNIQUE: Multidetector CT imaging of the cervical spine was performed without intravenous contrast. Multiplanar CT image reconstructions were also generated. RADIATION DOSE REDUCTION: This exam was performed according to the departmental dose-optimization program which includes automated exposure control, adjustment of the mA and/or kV according to patient size and/or use of iterative reconstruction technique. COMPARISON:  None Available. FINDINGS: Alignment: Straightening of normal lordosis. There is grade 1 anterolisthesis of C4 on C5 and C7 on T1. No traumatic subluxation. Skull base and vertebrae: No acute fracture. Vertebral body heights are maintained. The dens and skull base are intact. Soft tissues and spinal canal: No prevertebral fluid or swelling. No visible canal hematoma. Disc levels: Degenerative disc disease with disc space narrowing and spurring at C3-C4,  C4-C5, C5-C6 and C6-C7. Posterior disc bulges C3-C4. Upper chest: Chronic scarring.  No acute findings. Other: None. 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 Electronically Signed   By: Keith Rake M.D.   On: 08/11/2021 02:08   CT HEAD WO CONTRAST (5MM)  Result Date: 08/11/2021 CLINICAL DATA:  Head trauma, intracranial venous injury suspected Patient reports she walked into a door today getting up to use the restroom. Laceration to right forehead. EXAM: CT HEAD WITHOUT CONTRAST TECHNIQUE: Contiguous axial images were obtained from the base of the skull through the vertex without intravenous contrast. RADIATION DOSE REDUCTION: This exam was performed according to the departmental dose-optimization program which includes automated exposure control, adjustment of the mA and/or kV according to patient size and/or use of iterative reconstruction technique. COMPARISON:  None Available. FINDINGS: Brain: No intracranial hemorrhage, mass effect, or midline shift. Normal for age atrophy. No hydrocephalus. The basilar cisterns are patent. Moderate periventricular and deep white matter hypodensity typical of chronic small vessel ischemia. No evidence of territorial infarct or acute ischemia. No extra-axial or intracranial fluid collection. Vascular: Atherosclerosis of skullbase vasculature without hyperdense vessel or abnormal calcification. Skull: No fracture or focal lesion. Sinuses/Orbits: Paranasal sinuses and mastoid air cells are clear. The visualized orbits are unremarkable. Other: Small right frontal scalp laceration IMPRESSION: 1. Small right frontal scalp laceration. No acute intracranial abnormality. No skull fracture. 2. Age-related atrophy and chronic small vessel ischemia. Electronically Signed   By: Aurther Loft.D.  On: 08/11/2021 02:01     Assessment and Recommendation  76 y.o. female with some concerns for significant  instability and a fall causing a facial laceration and contusion without evidence of atrial fibrillation or apparent primary syncope or cause for fall and no current evidence of stroke congestive heart failure or acute coronary syndrome 1.  Continue supportive care of fall and injury 2.  Continue telemetry to assess for improvements of episodes of atrial tachycardia and/or preatrial contractions without current evidence of atrial fibrillation 3.  No additional cardiac diagnostics necessary at this time 4.  Begin ambulation and follow-up for improvements of symptoms 5.  Call if further questions depending on telemetry changes  Signed, Serafina Royals M.D. FACC

## 2021-08-14 NOTE — Progress Notes (Signed)
[facility] Progress Note  Patient: Stephanie Patel    IYM:[id number]  DOA: 08/11/2021    Date of Service: the patient was seen and examined on 08/14/2021  Brief hospital course: 76 year old female past medical history of hypertension, pulmonary fibrosis/interstitial lung disease, rheumatoid arthritis and GERD presented to the emergency room on the early morning hours of 8/7 after an accidental fall.  No loss of consciousness.  Patient noted to have a small right frontal scalp laceration x-rays noting nondisplaced right superior and inferior pubic rami fractures.  Patient brought in for further evaluation.  Seen by PT and OT who recommended home health.  Pain relatively well controlled.  Initial plan was to discharge on 8/8 and while nursing was going over with patient discharge instructions, she started having some confusion and difficulty finding words.  Vital signs checked and were unremarkable.  No elevated blood pressures.  Rapid response evaluated patient.  Hospitalist attending also checked and evaluated patient.  Symptoms improved although by 20 minutes later, had not fully resolved and the patient was able to answer questions appropriately, she herself admitted that she had trouble getting the exact right words.  Code stroke called and patient went for stat head CT which was unremarkable.  Following head CT, patient completely back to normal.   8/9 - intermittently tachycardic, irregular rhythm, cardiology consulted.  PAC's with intermittent MAT.  No A-fib.  PT reassessed, now recommend SNF due to progressive weakness.  8/10 - similar HR's, start low dose metoprolol  Assessment and Plan: Assessment and Plan: * Dysarthria Unclear etiology.  Possible TIA?   Underwent full stroke work-up with A1c, echo, Dopplers and lipid panel.   MRI with no acute findings, showed chronic small vessel ischemic disease -- Telemetry   Symptoms resolved.    Closed fracture of multiple pubic rami,  right, initial encounter [facility]) Per orthopedic surgery, this is nonoperative. -- PT OT evaluations -- PT now recommending SNF -- OT recommending home health -- TOC working on SNF placement -- Continue pain control as needed   Irregular heart rhythm Cardiology consulted for assistance.  She appears in intermittent A-fib on telemetry but EKG today looks more like a sinus arrhythmia with P waves present but irregular rhythm.  Normal heart rate. --Cardiology consulted --Monitor on telemetry --8/10: start low dose PO metoprolol 12.5 mg BID   Accidental fall According to patient daughters, patient has had approximately 6 falls in the past month and a half.  Lives alone.   -- PT OT initially recommended home health --PT has upgraded recommendation to SNF -- Patient and daughter is agreeable to SNF -- TOC following for placement -- Fall precautions   Laceration of forehead Repaired in the emergency room.  Received Tdap.  Patient will follow-up with her PCP for staple removal  ILD (interstitial lung disease) ([place]) Continue nocturnal oxygen  Rheumatoid arthritis of multiple sites with negative rheumatoid factor ([place]) Pending med rec for continuation of Humira, methotrexate, leflunomide and Remicade  Depression Continue Wellbutrin and buspirone and Pristiq  IBS (irritable bowel syndrome) No acute issues.  Monitor  GERD (gastroesophageal reflux disease) Continue Protonix  HTN, goal below 140/80 Home regimen appears to be losartan 50 mg twice daily and Toprol-XL 50 mg daily. BPs overall stable, soft at times with antihypertensives held. -- Resume lower dose metoprolol 12.5 mg twice daily --Continue holding losartan for now to avoid hypotension --Control pain adequately.       Body mass index is 23.83 kg/m.  Consultants: Cardiology  Procedures: Echocardiogram --8/9 -LV hyperdynamic function with EF 70 to 75%, no LV regional WMA's, normal diastolic parameters, no  significant valvular disease Carotid Dopplers -- 8/9 -no hemodynamically significant stenosis, irregular heart rhythm noted  Antimicrobials: None  Code Status: DNR   Subjective: Patient was sleeping but woke easily to voice when seen on rounds.  No family at bedside during my encounter.  She reports feeling still very tired, also restless and "feeling down".  She is agreeable to short-term rehab.  She denies chest pain, palpitations, shortness of breath or dizziness lightheadedness.  No other acute complaints.  Objective: Vital signs were reviewed and unremarkable. Vitals:   08/14/21 1037 08/14/21 1612  BP: 118/87 119/79  Pulse: 70 84  Resp: 16   Temp: 98 F (36.7 C) 98 F (36.7 C)  SpO2: 95% 97%    Intake/Output Summary (Last 24 hours) at 08/14/2021 1629 Last data filed at 08/14/2021 1350 Gross per 24 hour  Intake 480 ml  Output --  Net 480 ml    Filed Weights   08/11/21 0131  Weight: 55.3 kg   Body mass index is 23.83 kg/m.  Exam:  General exam: Sleeping but wakes to voice and stays awake, no acute distress HEENT: Left forehead laceration stapled with no bleeding and ecchymosis and improving left periorbital ecchymosis, moist mucus membranes, hearing grossly normal  Respiratory system: On room air, normal respiratory effort. Cardiovascular system: Irregular rhythm, regular rate, no pedal edema.   Gastrointestinal system: Abdomen soft nontender nondistended Central nervous system: no gross focal neurologic deficits, normal speech.   Extremities: No peripheral edema, normal tone Psychiatry: normal mood, congruent affect   Data Reviewed: No new labs today   MRI brain with no evidence of stroke  Disposition:  Status is: Observation     Anticipated discharge date: 8/11 pending SNF bed  Remaining issues to be resolved so that patient can be discharged: ongoing intermittent tachycardia   Family Communication: Updated daughter at bedside on rounds 8/9.     Author: Ezekiel Slocumb ,DO 08/14/2021 4:29 PM  To reach On-call, see care teams to locate the attending and reach out via [email or web address]. Between 7PM-7AM, please contact night-coverage If you still have difficulty reaching the attending provider, please page the [facility] (Director on Call) for [facility] on amion for assistance.

## 2021-08-15 ENCOUNTER — Encounter: Payer: Self-pay | Admitting: Internal Medicine

## 2021-08-15 DIAGNOSIS — S32591A Other specified fracture of right pubis, initial encounter for closed fracture: Secondary | ICD-10-CM | POA: Diagnosis not present

## 2021-08-15 DIAGNOSIS — N390 Urinary tract infection, site not specified: Secondary | ICD-10-CM | POA: Clinically undetermined

## 2021-08-15 DIAGNOSIS — S32501A Unspecified fracture of right pubis, initial encounter for closed fracture: Secondary | ICD-10-CM | POA: Diagnosis not present

## 2021-08-15 LAB — URINALYSIS, ROUTINE W REFLEX MICROSCOPIC
Bilirubin Urine: NEGATIVE
Glucose, UA: NEGATIVE mg/dL
Ketones, ur: NEGATIVE mg/dL
Nitrite: POSITIVE — AB
Protein, ur: NEGATIVE mg/dL
Specific Gravity, Urine: 1.004 — ABNORMAL LOW (ref 1.005–1.030)
WBC, UA: >50 WBC/hpf — ABNORMAL HIGH (ref 0–5)
pH: 6 (ref 5.0–8.0)

## 2021-08-15 LAB — BASIC METABOLIC PANEL
Anion gap: 7 (ref 5–15)
BUN: 17 mg/dL (ref 8–23)
CO2: 27 mmol/L (ref 22–32)
Calcium: 9.5 mg/dL (ref 8.9–10.3)
Chloride: 105 mmol/L (ref 98–111)
Creatinine, Ser: 0.9 mg/dL (ref 0.44–1.00)
GFR, Estimated: >60 mL/min (ref >60)
Glucose, Bld: 100 mg/dL — ABNORMAL HIGH (ref 70–99)
Potassium: 3.9 mmol/L (ref 3.5–5.1)
Sodium: 139 mmol/L (ref 135–145)

## 2021-08-15 LAB — MAGNESIUM: Magnesium: 2.2 mg/dL (ref 1.7–2.4)

## 2021-08-15 MED ORDER — ACETAMINOPHEN 325 MG PO TABS: 650.0000 mg | ORAL_TABLET | Freq: Four times a day (QID) | ORAL | Status: DC | PRN

## 2021-08-15 MED ORDER — DULOXETINE HCL 30 MG PO CPEP
30.0000 mg | ORAL_CAPSULE | Freq: Every day | ORAL | Status: DC
  Administered 2021-08-15: 30 mg via ORAL
  Filled 2021-08-15: qty 1

## 2021-08-15 MED ORDER — GUAIFENESIN 100 MG/5ML PO LIQD
400.0000 mg | Freq: Every day | ORAL | Status: DC
  Administered 2021-08-15: 400 mg via ORAL
  Filled 2021-08-15: qty 20

## 2021-08-15 MED ORDER — LEVOFLOXACIN IN D5W 750 MG/150ML IV SOLN
750.0000 mg | INTRAVENOUS | Status: DC
  Administered 2021-08-15: 750 mg via INTRAVENOUS
  Filled 2021-08-15: qty 150

## 2021-08-15 MED ORDER — MIRTAZAPINE 15 MG PO TABS: 45.0000 mg | ORAL_TABLET | Freq: Every day | ORAL | Status: DC

## 2021-08-15 MED ORDER — ASPIRIN 81 MG PO TBEC
81.0000 mg | DELAYED_RELEASE_TABLET | Freq: Every day | ORAL | Status: DC
Start: 2021-08-15 — End: 2021-08-15

## 2021-08-15 MED ORDER — METOPROLOL SUCCINATE ER 50 MG PO TB24: 25.0000 mg | ORAL_TABLET | Freq: Every day | ORAL | Status: DC

## 2021-08-15 MED ORDER — LEVOFLOXACIN 750 MG PO TABS: 750.0000 mg | ORAL_TABLET | ORAL | 0 refills | Status: AC

## 2021-08-15 MED ORDER — METHOTREXATE 2.5 MG PO TABS
12.5000 mg | ORAL_TABLET | ORAL | Status: DC
  Administered 2021-08-15: 12.5 mg via ORAL
  Filled 2021-08-15: qty 5

## 2021-08-15 MED ORDER — HYDROCODONE-ACETAMINOPHEN 5-325 MG PO TABS: 1.0000 | ORAL_TABLET | ORAL | 0 refills | Status: DC | PRN

## 2021-08-15 NOTE — Progress Notes (Signed)
Occupational Therapy Treatment Patient Details Name: Stephanie Patel MRN: [id number] DOB: July 03, 1945 Today's Date: 08/15/2021   History of present illness Stephanie Patel is a 76 y.o. female with medical history significant for HTN, GERD, depression, rheumatoid arthritis, interstitial lung disease/pulmonary fibrosis, IBS who presents to the ED with right hip pain following an accidental fall after she walked into a door.  She also sustained a laceration to her forehead.  ED course and data review: Vitals within normal limits except for slightly elevated blood pressure of 141/76.  Labs with WBC 14,000 and otherwise normal CBC and BMP with creatinine 1.17 which is above most recent baseline of 0.9 on 02/2021.  Imaging: CT head showed a small right frontal scalp laceration with no acute intracranial abnormality or skull fracture  CT C-spine showing multilevel degenerative disc disease without acute fracture  X-rays hip shows essentially nondisplaced right superior and inferior multiple pubic rami fractures at the pubic body.   OT comments  Chart reviewed, pt greeted requesting to amb to bathroom. Pt is oriented to self, place, situation. Pt with fair safety awareness. Supervision-CGA required for amb to bathroom with RW, toilet transfer completed with Supervision. Pt stating "I cant walk anymore I am so tired". Pt returned to bed, all needs met, NAD. Discharge recommendations have been updated to STR. OT will continue to follow acutely.    Recommendations for follow up therapy are one component of a multi-disciplinary discharge planning process, led by the attending physician.  Recommendations may be updated based on patient status, additional functional criteria and insurance authorization.    Follow Up Recommendations  Skilled nursing-short term rehab (<3 hours/day)    Assistance Recommended at Discharge Frequent or constant Supervision/Assistance  Patient can return home with the following  A little  help with walking and/or transfers;A little help with bathing/dressing/bathroom;Assistance with cooking/housework;Help with stairs or ramp for entrance;Assist for transportation   Equipment Recommendations  BSC/3in1;Tub/shower seat    Recommendations for Other Services      Precautions / Restrictions Precautions Precautions: Fall Restrictions Weight Bearing Restrictions: No       Mobility Bed Mobility Overal bed mobility: Needs Assistance Bed Mobility: Sit to Supine       Sit to supine: Supervision        Transfers Overall transfer level: Needs assistance Equipment used: Rolling walker (2 wheels) Transfers: Sit to/from Stand                   Balance Overall balance assessment: Needs assistance Sitting-balance support: Feet supported Sitting balance-Leahy Scale: Good     Standing balance support: Bilateral upper extremity supported Standing balance-Leahy Scale: Fair                             ADL either performed or assessed with clinical judgement   ADL Overall ADL's : Needs assistance/impaired     Grooming: Wash/dry hands;Standing;Supervision/safety               Lower Body Dressing: Sit to/from stand;Min guard   Toilet Transfer: Supervision/safety;Ambulation;Rolling walker (2 wheels)   Toileting- Clothing Manipulation and Hygiene: Sit to/from stand;Min guard       Functional mobility during ADLs: Supervision/safety;Min guard;Rolling walker (2 wheels);Cueing for safety      Extremity/Trunk Assessment              Vision       Perception     Praxis  Cognition Arousal/Alertness: Awake/alert Behavior During Therapy: WFL for tasks assessed/performed Overall Cognitive Status: Impaired/Different from baseline Area of Impairment: Safety/judgement, Memory, Orientation                 Orientation Level: Disoriented to, Time   Memory: Decreased recall of precautions, Decreased short-term memory    Safety/Judgement: Decreased awareness of safety, Decreased awareness of deficits              Exercises      Shoulder Instructions       General Comments      Pertinent Vitals/ Pain       Pain Assessment Pain Assessment: Faces Faces Pain Scale: Hurts a little bit Pain Location: R groin/hip area Pain Descriptors / Indicators: Guarding Pain Intervention(s): Limited activity within patient's tolerance, Monitored during session, Ice applied, Repositioned  Home Living                                          Prior Functioning/Environment              Frequency  Min 2X/week        Progress Toward Goals  OT Goals(current goals can now be found in the care plan section)  Progress towards OT goals: Progressing toward goals     Plan Discharge plan remains appropriate    Co-evaluation                 AM-PAC OT "6 Clicks" Daily Activity     Outcome Measure   Help from another person eating meals?: None Help from another person taking care of personal grooming?: A Little Help from another person toileting, which includes using toliet, bedpan, or urinal?: None Help from another person bathing (including washing, rinsing, drying)?: A Little Help from another person to put on and taking off regular upper body clothing?: None Help from another person to put on and taking off regular lower body clothing?: A Little 6 Click Score: 21    End of Session Equipment Utilized During Treatment: Rolling walker (2 wheels)  OT Visit Diagnosis: Unsteadiness on feet (R26.81);Muscle weakness (generalized) (M62.81);History of falling (Z91.81)   Activity Tolerance Patient tolerated treatment well   Patient Left in bed;with call bell/phone within reach;with bed alarm set   Nurse Communication Mobility status        Time: 4401-0272 OT Time Calculation (min): 11 min  Charges: OT General Charges $OT Visit: 1 Visit OT Treatments $Self Care/Home  Management : 8-22 mins  Shanon Payor, OTD OTR/L  08/15/21, 12:49 PM

## 2021-08-15 NOTE — Assessment & Plan Note (Signed)
UA was not checked on admission.  Last night, pt noted to have urinary frequency and UA was checked that was abnormal, possibly infection although pt may have colonization. --Started PO Levaquin  --Will follow urine culture for results

## 2021-08-15 NOTE — Discharge Summary (Signed)
Physician Discharge Summary   Patient: Stephanie Patel MRN: [id number] DOB: 1945-09-27  Admit date:     08/11/2021  Discharge date: 08/15/21  Discharge Physician: Ezekiel Slocumb   PCP: Idelle Crouch, MD   Recommendations at discharge:    Follow up with Primary Care in 1-2 weeks Repeat CBC, BMP, Mg in 1-2 weeks Follow up Blood Pressure -- losartan stopped at d/c and metoprolol dose was reduced to avoid hypotension Follow up on recovery from pelvic fracture Consider cardiology referral for ambulatory heart monitor if having episodes of tachycardia. Pt was noted to intermittently have frequent PAC's, but no frank A-fib was seen.     Discharge Diagnoses: Principal Problem:   Closed fracture of multiple pubic rami, right, initial encounter ([place]) Active Problems:   UTI (urinary tract infection)   Accidental fall   Irregular heart rhythm   Laceration of forehead   ILD (interstitial lung disease) (HCC)   Rheumatoid arthritis of multiple sites with negative rheumatoid factor (HCC)   Depression   IBS (irritable bowel syndrome)   GERD (gastroesophageal reflux disease)   HTN, goal below 140/80  Resolved Problems:   Dysarthria  Hospital Course: 76 year old female past medical history of hypertension, pulmonary fibrosis/interstitial lung disease, rheumatoid arthritis and GERD presented to the emergency room on the early morning hours of 8/7 after an accidental fall.  No loss of consciousness.  Patient noted to have a small right frontal scalp laceration x-rays noting nondisplaced right superior and inferior pubic rami fractures.  Patient brought in for further evaluation.  Seen by PT and OT who recommended home health.  Pain relatively well controlled.  Initial plan was to discharge on 8/8 and while nursing was going over with patient discharge instructions, she started having some confusion and difficulty finding words.  Vital signs checked and were unremarkable.  No elevated blood  pressures.  Rapid response evaluated patient.  Hospitalist attending also checked and evaluated patient.  Symptoms improved although by 20 minutes later, had not fully resolved and the patient was able to answer questions appropriately, she herself admitted that she had trouble getting the exact right words.  Code stroke called and patient went for stat head CT which was unremarkable.  Following head CT, patient completely back to normal.   8/11 -- Patient is medically stable for discharge to rehab. Started on oral Levaquin for possible UTI and will follow up culture results that are pending.  Patient is afebrile, no leukocytosis.  If true infection, does not require IV antibiotics.   Assessment and Plan: * Closed fracture of multiple pubic rami, right, initial encounter [facility]) Per orthopedic surgery, this is nonoperative. -- SNF/rehab at d/c for ongoing PT and OT -- Continue pain control as needed   UTI (urinary tract infection) UA was not checked on admission.  Last night, pt noted to have urinary frequency and UA was checked that was abnormal, possibly infection although pt may have colonization. --Started PO Levaquin  --Will follow urine culture for results  Dysarthria-resolved as of 08/15/2021 Unclear etiology.  Possible TIA vs transient hypotension.  Underwent full stroke work-up with A1c, echo, Dopplers and lipid panel that was unremarkable.   MRI with no acute findings, showed chronic small vessel ischemic disease Symptoms resolved and have not recurred.    Irregular heart rhythm Cardiology consulted for assistance.  Patient has been sinus rhythm with PACs and intermittent multifocal atrial tachycardia. No overt A-fib was seen on telemetry. --Consider ambulatory heart monitor if pt continues having  episodes --Continue metoprolol   Accidental fall According to patient daughters, patient has had approximately 6 falls in the past month and a half.  Lives alone.   -- PT OT  recommendation to SNF/rehab -- Patient and daughter is agreeable to SNF -- Approved for d/c to SNF today and medically stable   Laceration of forehead Repaired in the emergency room.  Received Tdap.  Patient will follow-up with her PCP for staple removal  ILD (interstitial lung disease) ([place]) Continue nocturnal oxygen  Rheumatoid arthritis of multiple sites with negative rheumatoid factor ([place]) Continue home medications. Follow up as scheduled with PCP and/or rheumatology.  Depression Continue Cymbalta, Remeron at bedtime  IBS (irritable bowel syndrome) No acute issues.   --Continue Bentyl  GERD (gastroesophageal reflux disease) Per med history taken by pharmacy, patient no longer takes PPI.  HTN, goal below 140/80 Home regimen appears to be losartan 50 mg twice daily and Toprol-XL 50 mg daily. Bps have been normal and occasionally soft with losartan held and on lower dose metoprolol -- Stop losartan at d/c  -- Continue lower dose metoprolol 25 mg daily          Consultants: Cardiology, Orthopedics Procedures performed: none   Disposition: Skilled nursing facility  Diet recommendation:  Discharge Diet Orders (From admission, onward)     Start     Ordered   08/15/21 0000  Diet - low sodium heart healthy        08/15/21 1259   08/11/21 0000  Diet - low sodium heart healthy        08/11/21 1514           Cardiac diet DISCHARGE MEDICATION: Allergies as of 08/15/2021       Reactions   Keflex [cephalexin] Hives   Peanut-containing Drug Products Other (See Comments)   Patient had an allergy panel and this was found to be an allergy for the patient   Aricept [donepezil] Diarrhea   Codeine Nausea And Vomiting, Other (See Comments)   Tolerates with anti-nausea medication ([id number])   Hydrochlorothiazide Other (See Comments)   Leg cramps        Medication List     STOP taking these medications    losartan 50 MG tablet Commonly known as:  COZAAR       TAKE these medications    acetaminophen 325 MG tablet Commonly known as: TYLENOL Take 2 tablets (650 mg total) by mouth every 6 (six) hours as needed for mild pain (or Fever >/= 101).   aspirin EC 81 MG tablet Take 81 mg by mouth daily. Swallow whole.   CALCIUM 600+D3 PO Take 600 mg by mouth daily in the afternoon.   dicyclomine 20 MG tablet Commonly known as: BENTYL Take 20 mg by mouth 4 (four) times daily.   DULoxetine 30 MG capsule Commonly known as: CYMBALTA Take 30 mg by mouth daily.   fluticasone 50 MCG/ACT nasal spray Commonly known as: FLONASE 1 SPRAY INTO BOTH NOSTRILS EVERY DAY AS NEEDED FOR ALLERGIES   folic acid 1 MG tablet Commonly known as: FOLVITE Take 1 mg by mouth daily.   guaifenesin 400 MG Tabs tablet Commonly known as: HUMIBID E Take 400 mg by mouth daily in the afternoon.   HYDROcodone-acetaminophen 5-325 MG tablet Commonly known as: NORCO/VICODIN Take 1-2 tablets by mouth every 4 (four) hours as needed for moderate pain.   hyoscyamine 0.125 MG SL tablet Commonly known as: LEVSIN SL Place 0.125 mg under the tongue every 6 (six) hours  as needed for cramping (diarrhea).   ibandronate 150 MG tablet Commonly known as: BONIVA Take 150 mg by mouth every 30 (thirty) days. TAKE 1 TABLET EVERY 30 DAYS TAKE IN MORNING WITH A GLASS OF WATER ON EMPTY STOMACH. DO NOT LIE DOWN FOR 60 MINUTES   inFLIXimab 100 MG injection Commonly known as: REMICADE Inject into the vein.   levofloxacin 750 MG tablet Commonly known as: Levaquin Take 1 tablet (750 mg total) by mouth every other day for 6 days. Start taking on: August 17, 2021   memantine 10 MG tablet Commonly known as: NAMENDA Take 10 mg by mouth 2 (two) times daily. What changed: Another medication with the same name was removed. Continue taking this medication, and follow the directions you see here.   mesalamine 1.2 g EC tablet Commonly known as: LIALDA Take 2.4 g by mouth daily.    methotrexate 2.5 MG tablet Commonly known as: RHEUMATREX Take 12.5 mg by mouth once a week.   metoprolol succinate 50 MG 24 hr tablet Commonly known as: TOPROL-XL Take 0.5 tablets (25 mg total) by mouth daily. Take with or immediately following a meal. What changed: how much to take   mirtazapine 15 MG tablet Commonly known as: REMERON Take 15 mg by mouth at bedtime.   mirtazapine 30 MG tablet Commonly known as: REMERON Take 30 mg by mouth at bedtime.   potassium chloride SA 20 MEQ tablet Commonly known as: KLOR-CON M Take 20 mEq by mouth 2 (two) times daily.   Vitamin D (Ergocalciferol) 50000 units Caps Take 1 capsule by mouth once a week.               Durable Medical Equipment  (From admission, onward)           Start     Ordered   08/13/21 1229  For home use only DME 3 n 1  Once        08/13/21 1228            Discharge Exam: Filed Weights   08/11/21 0131  Weight: 55.3 kg   General exam: awake, alert, no acute distress, frail HEENT: improving right periorbital and forehead ecchymosis, laceration with no bleeding or drainage or surrounding swelling or erythema, moist mucus membranes, hearing grossly normal  Respiratory system: CTAB, no wheezes, rales or rhonchi, normal respiratory effort. Cardiovascular system: normal S1/S2, RRR, no pedal edema.   Gastrointestinal system: soft, NT, ND, no HSM felt, +bowel sounds. Central nervous system: A&O x3. no gross focal neurologic deficits, normal speech Extremities: moves all, no edema, normal tone Skin: dry, intact, normal temperature Psychiatry: anxious mood, congruent affect, abnormal judgement and insight    Condition at discharge: stable  The results of significant diagnostics from this hospitalization (including imaging, microbiology, ancillary and laboratory) are listed below for reference.   Imaging Studies: MR BRAIN WO CONTRAST  Result Date: 08/14/2021 CLINICAL DATA:  Initial evaluation for  mental status change, unknown cause. EXAM: MRI HEAD WITHOUT CONTRAST TECHNIQUE: Multiplanar, multiecho pulse sequences of the brain and surrounding structures were obtained without intravenous contrast. COMPARISON:  Prior CT from 08/12/2021 as well as prior brain MRI from 12/12/2018. FINDINGS: Brain: Mild age-related cerebral atrophy. Patchy and confluent T2/FLAIR hyperintensity involving the periventricular and deep white matter both cerebral hemispheres as well as the pons, most consistent with chronic small vessel ischemic disease, moderately advanced in nature, and progressed as compared to most recent MRI from 12/12/2018. No abnormal foci of restricted diffusion to suggest  acute or subacute ischemia. Gray-white matter differentiation maintained. No areas of chronic cortical infarction. No acute intracranial hemorrhage. 1.1 cm linear focus of susceptibility artifact noted involving the left frontal operculum (series 13, image 35), consistent with a small focus of chronic hemorrhage. Few additional punctate chronic micro hemorrhages noted within the contralateral right cerebral hemisphere. No mass lesion, midline shift or mass effect. No hydrocephalus or extra-axial fluid collection. Pituitary gland and suprasellar region within normal limits. Vascular: Major intracranial vascular flow voids are maintained. Skull and upper cervical spine: Craniocervical junction within normal limits. Bone marrow signal intensity normal. Small evolving right frontal scalp contusion noted. Sinuses/Orbits: Prior bilateral ocular lens replacement. Globes orbital soft tissues demonstrate no other acute finding. Scattered mucosal thickening noted about the right greater than left ethmoidal air cells. No mastoid effusion. Other: None. IMPRESSION: 1. No acute intracranial abnormality. 2. Age-related cerebral atrophy with moderate chronic microvascular ischemic disease, progressed as compared to most recent MRI from 12/12/2018. 3. Small  evolving right frontal scalp contusion. Electronically Signed   By: Jeannine Boga M.D.   On: 08/14/2021 04:33   ECHOCARDIOGRAM COMPLETE  Result Date: 08/13/2021    ECHOCARDIOGRAM REPORT   Patient Name:   Stephanie Patel Date of Exam: 08/13/2021 Medical Rec #:  [id number]      Height:       60.0 in Accession #:    [id number]     Weight:       122.0 lb Date of Birth:  1945/03/23      BSA:          1.513 m Patient Age:    27 years       BP:           128/86 mmHg Patient Gender: F              HR:           97 bpm. Exam Location:  [facility] Procedure: 2D Echo, Cardiac Doppler and Color Doppler Indications:     TIA G45.9  History:         Patient has prior history of Echocardiogram examinations, most                  recent 02/14/2020. Signs/Symptoms:Murmur; Risk                  Factors:Hypertension. LVH, pulmonary fibrosis.  Sonographer:     Sherrie Sport Referring Phys:  [place] Diagnosing Phys: Serafina Royals MD  Sonographer Comments: Suboptimal parasternal window and suboptimal apical window. IMPRESSIONS  1. Left ventricular ejection fraction, by estimation, is 70 to 75%. The left ventricle has hyperdynamic function. The left ventricle has no regional wall motion abnormalities. Left ventricular diastolic parameters were normal.  2. Right ventricular systolic function is normal. The right ventricular size is normal.  3. The mitral valve is normal in structure. Trivial mitral valve regurgitation.  4. The aortic valve is normal in structure. Aortic valve regurgitation is not visualized. FINDINGS  Left Ventricle: Left ventricular ejection fraction, by estimation, is 70 to 75%. The left ventricle has hyperdynamic function. The left ventricle has no regional wall motion abnormalities. The left ventricular internal cavity size was small. There is no  left ventricular hypertrophy. Left ventricular diastolic parameters were normal. Right Ventricle: The right ventricular size is normal. No increase in right  ventricular wall thickness. Right ventricular systolic function is normal. Left Atrium: Left atrial size was normal in size. Right Atrium: Right atrial size  was normal in size. Pericardium: There is no evidence of pericardial effusion. Mitral Valve: The mitral valve is normal in structure. Trivial mitral valve regurgitation. Tricuspid Valve: The tricuspid valve is normal in structure. Tricuspid valve regurgitation is trivial. Aortic Valve: The aortic valve is normal in structure. Aortic valve regurgitation is not visualized. Aortic valve mean gradient measures 5.0 mmHg. Aortic valve peak gradient measures 11.3 mmHg. Aortic valve area, by VTI measures 2.85 cm. Pulmonic Valve: The pulmonic valve was normal in structure. Pulmonic valve regurgitation is not visualized. Aorta: The aortic root and ascending aorta are structurally normal, with no evidence of dilitation. IAS/Shunts: No atrial level shunt detected by color flow Doppler.  LEFT VENTRICLE PLAX 2D LVIDd:         3.60 cm   Diastology LVIDs:         2.40 cm   LV e' medial:    3.05 cm/s LV PW:         1.30 cm   LV E/e' medial:  19.4 LV IVS:        1.10 cm   LV e' lateral:   5.11 cm/s LVOT diam:     2.00 cm   LV E/e' lateral: 11.6 LV SV:         56 LV SV Index:   37 LVOT Area:     3.14 cm  RIGHT VENTRICLE RV Basal diam:  2.60 cm RV S prime:     12.40 cm/s TAPSE (M-mode): 1.2 cm LEFT ATRIUM             Index        RIGHT ATRIUM           Index LA diam:        3.40 cm 2.25 cm/m   RA Area:     17.10 cm LA Vol (A2C):   41.6 ml 27.50 ml/m  RA Volume:   44.20 ml  29.21 ml/m LA Vol (A4C):   34.7 ml 22.94 ml/m LA Biplane Vol: 38.3 ml 25.32 ml/m  AORTIC VALVE AV Area (Vmax):    2.00 cm AV Area (Vmean):   2.23 cm AV Area (VTI):     2.85 cm AV Vmax:           168.00 cm/s AV Vmean:          98.700 cm/s AV VTI:            0.197 m AV Peak Grad:      11.3 mmHg AV Mean Grad:      5.0 mmHg LVOT Vmax:         107.00 cm/s LVOT Vmean:        70.100 cm/s LVOT VTI:           0.179 m LVOT/AV VTI ratio: 0.91  AORTA Ao Root diam: 3.43 cm MITRAL VALVE               TRICUSPID VALVE MV Area (PHT): 3.21 cm    TR Peak grad:   13.8 mmHg MV Decel Time: 236 msec    TR Vmax:        186.00 cm/s MV E velocity: 59.10 cm/s MV A velocity: 82.30 cm/s  SHUNTS MV E/A ratio:  0.72        Systemic VTI:  0.18 m                            Systemic Diam: 2.00 cm Serafina Royals  MD Electronically signed by Serafina Royals MD Signature Date/Time: 08/13/2021/12:44:50 PM    Final    US Carotid Bilateral  Result Date: 08/13/2021 CLINICAL DATA:  76 year old female with a history of TIA EXAM: BILATERAL CAROTID DUPLEX ULTRASOUND TECHNIQUE: Pearline Cables scale imaging, color Doppler and duplex ultrasound were performed of bilateral carotid and vertebral arteries in the neck. COMPARISON:  None Available. FINDINGS: Criteria: Quantification of carotid stenosis is based on velocity parameters that correlate the residual internal carotid diameter with NASCET-based stenosis levels, using the diameter of the distal internal carotid lumen as the denominator for stenosis measurement. The following velocity measurements were obtained: RIGHT ICA:  Systolic 73 cm/sec, Diastolic 23 cm/sec CCA:  60 cm/sec SYSTOLIC ICA/CCA RATIO:  1.2 ECA:  32 cm/sec LEFT ICA:  Systolic 67 cm/sec, Diastolic 26 cm/sec CCA:  41 cm/sec SYSTOLIC ICA/CCA RATIO:  1.6 ECA:  35 cm/sec Right Brachial SBP: Not acquired Left Brachial SBP: Not acquired RIGHT CAROTID ARTERY: No significant calcified disease of the right common carotid artery. Intermediate waveform maintained. Homogeneous plaque without significant calcifications at the right carotid bifurcation. Low resistance waveform of the right ICA. No significant tortuosity. RIGHT VERTEBRAL ARTERY: Antegrade flow with low resistance waveform. LEFT CAROTID ARTERY: No significant calcified disease of the left common carotid artery. Intermediate waveform maintained. Homogeneous plaque at the left carotid bifurcation  without significant calcifications. Low resistance waveform of the left ICA. LEFT VERTEBRAL ARTERY:  Antegrade flow with low resistance waveform. Additional: Irregular rhythm identified during the exam. IMPRESSION: Color duplex indicates minimal homogeneous plaque, with no hemodynamically significant stenosis by duplex criteria in the extracranial cerebrovascular circulation. Irregular heart rhythm identified during the exam. Correlation with any history of atrial fibrillation and/or EKG may be useful. Signed, Dulcy Fanny. Nadene Rubins, RPVI [facility] Radiology Electronically Signed   By: Corrie Mckusick D.O.   On: 08/13/2021 08:26   CT HEAD WO CONTRAST (5MM)  Result Date: 08/12/2021 CLINICAL DATA:  Neuro deficit, acute, stroke suspected sudden onset confusion, dysarthric speech EXAM: CT HEAD WITHOUT CONTRAST TECHNIQUE: Contiguous axial images were obtained from the base of the skull through the vertex without intravenous contrast. RADIATION DOSE REDUCTION: This exam was performed according to the departmental dose-optimization program which includes automated exposure control, adjustment of the mA and/or kV according to patient size and/or use of iterative reconstruction technique. COMPARISON:  Head CT 08/11/2021 FINDINGS: Brain: No evidence of acute intracranial hemorrhage or extra-axial collection.Patent basal cisterns.The ventricles are unchanged in size.Confluent periventricular and subcortical white matter hypoattenuation, which is nonspecific but likely sequela of chronic small vessel ischemic disease.Mild cerebral atrophy Vascular: No hyperdense vessel Skull: Negative for skull fracture. Sinuses/Orbits: Mild ethmoid air cell mucosal thickening. Orbits are unremarkable. Other: Right frontal scalp laceration. IMPRESSION: No acute intracranial abnormality. Unchanged sequela of chronic small vessel ischemic disease and cerebral atrophy. Right frontal scalp  laceration. Electronically Signed   By: Maurine Simmering M.D.   On: 08/12/2021 15:58   DG Chest Portable 1 View  Result Date: 08/11/2021 CLINICAL DATA:  Fall, hypoxia EXAM: PORTABLE CHEST 1 VIEW COMPARISON:  None Available. FINDINGS: Stable elevation of the right hemidiaphragm. Mild bibasilar atelectasis or scarring. No confluent pulmonary infiltrate. No pneumothorax or pleural effusion. Cardiac size within normal limits. Pulmonary vascularity is normal. 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 No acute bone abnormality. IMPRESSION: 1. No acute cardiopulmonary disease. 2. Stable elevation of the right hemidiaphragm. Electronically Signed   By: Cassandria Anger  Christa See M.D.   On: 08/11/2021 05:01   DG HIP UNILAT WITH PELVIS 2-3 VIEWS RIGHT  Result Date: 08/11/2021 CLINICAL DATA:  Fall today with right hip pain. EXAM: DG HIP (WITH OR WITHOUT PELVIS) 2-3V RIGHT COMPARISON:  None Available. FINDINGS: Essentially nondisplaced fracture of the right superior and inferior pubic rami is at the pubic body. There is no definite symphyseal involvement. No fracture of the right acetabulum or proximal femur. The femoral head is well seated in the acetabulum. No sacroiliac joint widening. IMPRESSION: Essentially nondisplaced right superior and inferior pubic rami fractures at the pubic body. Electronically Signed   By: Keith Rake M.D.   On: 08/11/2021 02:10   CT Cervical Spine Wo Contrast  Result Date: 08/11/2021 CLINICAL DATA:  Neck trauma (Age >= 65y) Patient walked into a door while getting up to use the restroom. EXAM: CT CERVICAL SPINE WITHOUT CONTRAST TECHNIQUE: Multidetector CT imaging of the cervical spine was performed without intravenous contrast. Multiplanar CT image reconstructions were also generated. RADIATION DOSE REDUCTION: This exam was performed according to the departmental dose-optimization program which includes automated exposure control, adjustment of  the mA and/or kV according to patient size and/or use of iterative reconstruction technique. COMPARISON:  None Available. FINDINGS: Alignment: Straightening of normal lordosis. There is grade 1 anterolisthesis of C4 on C5 and C7 on T1. No traumatic subluxation. Skull base and vertebrae: No acute fracture. Vertebral body heights are maintained. The dens and skull base are intact. Soft tissues and spinal canal: No prevertebral fluid or swelling. No visible canal hematoma. Disc levels: Degenerative disc disease with disc space narrowing and spurring at C3-C4, C4-C5, C5-C6 and C6-C7. Posterior disc bulges C3-C4. Upper chest: Chronic scarring.  No acute findings. Other: None. 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 Electronically Signed   By: Keith Rake M.D.   On: 08/11/2021 02:08   CT HEAD WO CONTRAST (5MM)  Result Date: 08/11/2021 CLINICAL DATA:  Head trauma, intracranial venous injury suspected Patient reports she walked into a door today getting up to use the restroom. Laceration to right forehead. EXAM: CT HEAD WITHOUT CONTRAST TECHNIQUE: Contiguous axial images were obtained from the base of the skull through the vertex without intravenous contrast. RADIATION DOSE REDUCTION: This exam was performed according to the departmental dose-optimization program which includes automated exposure control, adjustment of the mA and/or kV according to patient size and/or use of iterative reconstruction technique. COMPARISON:  None Available. FINDINGS: Brain: No intracranial hemorrhage, mass effect, or midline shift. Normal for age atrophy. No hydrocephalus. The basilar cisterns are patent. Moderate periventricular and deep white matter hypodensity typical of chronic small vessel ischemia. No evidence of territorial infarct or acute ischemia. No extra-axial or intracranial fluid collection. Vascular: Atherosclerosis of skullbase vasculature  without hyperdense vessel or abnormal calcification. Skull: No fracture or focal lesion. Sinuses/Orbits: Paranasal sinuses and mastoid air cells are clear. The visualized orbits are unremarkable. Other: Small right frontal scalp laceration IMPRESSION: 1. Small right frontal scalp laceration. No acute intracranial abnormality. No skull fracture. 2. Age-related atrophy and chronic small vessel ischemia. Electronically Signed   By: Keith Rake M.D.   On: 08/11/2021 02:01    Microbiology: Results for orders placed or performed during the hospital encounter of 03/11/20  SARS CORONAVIRUS 2 (TAT 6-24 HRS) Nasopharyngeal Nasopharyngeal Swab     Status: None   Collection Time: 03/11/20  2:10 PM   Specimen: Nasopharyngeal Swab  Result Value Ref Range Status  SARS Coronavirus 2 NEGATIVE NEGATIVE Final    Comment: (NOTE) SARS-CoV-2 target nucleic acids are NOT DETECTED.  The SARS-CoV-2 RNA is generally detectable in upper and lower respiratory specimens during the acute phase of infection. Negative results do not preclude SARS-CoV-2 infection, do not rule out co-infections with other pathogens, and should not be used as the sole basis for treatment or other patient management decisions. Negative results must be combined with clinical observations, patient history, and epidemiological information. The expected result is Negative.  Fact Sheet for Patients: SugarRoll.be  Fact Sheet for Healthcare Providers: https://www.woods-mathews.com/  This test is not yet approved or cleared by the [place] FDA and  has been authorized for detection and/or diagnosis of SARS-CoV-2 by FDA under an Emergency Use Authorization (EUA). This EUA will remain  in effect (meaning this test can be used) for the duration of the COVID-19 declaration under Se ction 564(b)(1) of the Act, 21 U.S.C. section 360bbb-3(b)(1), unless the authorization is terminated or revoked  sooner.  Performed at [facility], [place] [address] [place], [place] [address]     Labs: CBC: Recent Labs  Lab 08/11/21 0232 08/12/21 0604 08/13/21 0630  WBC 14.2* 6.2 7.0  NEUTROABS 11.9*  --   --   HGB 13.1 11.5* 11.0*  HCT 40.2 35.6* 33.6*  MCV 98.3 98.9 98.2  PLT 233 179 500   Basic Metabolic Panel: Recent Labs  Lab 08/11/21 0232 08/12/21 0604 08/13/21 0630 08/15/21 0613  NA 139 141 139 139  K 4.5 4.5 4.0 3.9  CL 106 110 108 105  CO2 '22 27 28 27  '$ GLUCOSE 134* 102* 106* 100*  BUN '21 18 17 17  '$ CREATININE 1.17* 1.11* 1.02* 0.90  CALCIUM 9.4 8.7* 8.7* 9.5  MG  --   --   --  2.2   Liver Function Tests: Recent Labs  Lab 08/13/21 0630  AST 21  ALT 17  ALKPHOS 58  BILITOT 0.5  PROT 6.1*  ALBUMIN 3.0*   CBG: Recent Labs  Lab 08/12/21 1504  GLUCAP 124*    Discharge time spent: greater than 30 minutes.  Signed: Ezekiel Slocumb, DO [facility] 08/15/2021

## 2021-08-15 NOTE — TOC Progression Note (Signed)
Transition of Care [facility]) - Progression Note    Patient Details  Name: Stephanie Patel MRN: [id number] Date of Birth: 22-Oct-1945  Transition of Care [facility]) CM/SW [place], RN Phone Number: 08/15/2021, 1:43 PM  Clinical Narrative:    Spoke with the patient's daughter and she is going to call the patient's brother to see if he can Transport her to [facility] today   Expected Discharge Plan: Skilled Nursing Facility Barriers to Discharge: Barriers Resolved  Expected Discharge Plan and Services Expected Discharge Plan: [place]   Discharge Planning Services: CM Consult   Living arrangements for the past 2 months: [place] Expected Discharge Date: 08/15/21                 DME Agency: NA       HH Arranged: PT, OT HH Agency: [place] Date [facility] Agency Contacted: 08/11/21 Time [place]: [id number] Representative spoke with at [place]: [place] ([place]) Interventions    Readmission Risk Interventions    08/15/2021    9:53 AM  Readmission Risk Prevention Plan  Transportation Screening Complete  PCP or Specialist Appt within 3-5 Days Complete  HRI or [place] Complete  Social Work Consult for [place] Planning/Counseling Complete  Palliative Care Screening Not Applicable  Medication Review Press photographer) Complete

## 2021-08-15 NOTE — TOC Benefit Eligibility Note (Signed)
Transition of Care [facility]) Benefit Eligibility Note    Patient Details  Name: Stephanie Patel MRN: [id number] Date of Birth: [birth year]  SNF Benefits:  Number called: [phone] Rep: Reuben Likes Reference Number: [id number]  [employer] Medicare PPO policy effective as of 01/05/21 with no deductible.  Out of pocket max for both in/out-of-network: $4,000, $660.00 met as of time of call.     In-network SNF: $0 copay for days 1-20 and a $50 daily copay for days 21-100.  Once out of pocket is reached, patient covered at 100% for remainder of 100 day benefit period.   Responsible for 100% of cost after 100 day benefit period ends.  Out-of-network SNF: Same as in-network benefits above.   Josem Kaufmann is required: [phone] or via [place] Phone Number: [phone] or [phone] 08/15/2021, 8:38 AM

## 2021-08-15 NOTE — Progress Notes (Signed)
PHARMACY NOTE:  ANTIMICROBIAL RENAL DOSAGE ADJUSTMENT  Current antimicrobial regimen includes a mismatch between antimicrobial dosage and estimated renal function.  As per policy approved by the Pharmacy & Therapeutics and Medical Executive Committees, the antimicrobial dosage will be adjusted accordingly.  Current antimicrobial dosage:  Levofloxacin '750mg'$  every 24 hours  Indication: UTI  Renal Function:  Estimated Creatinine Clearance: 40.8 mL/min (by C-G formula based on SCr of 0.9 mg/dL).     Antimicrobial dosage has been changed to:  '750mg'$  Every 48 hours for CrCL <44m/min  Additional comments:   Thank you for allowing pharmacy to be a part of this patient's care.  SPernell Dupre PharmD, BCPS Clinical Pharmacist 08/15/2021 7:59 AM

## 2021-08-15 NOTE — Progress Notes (Signed)
Physical Therapy Treatment Patient Details Name: Stephanie Patel MRN: [id number] DOB: 1945/11/16 Today's Date: 08/15/2021   History of Present Illness Stephanie Patel is a 76 y.o. female with medical history significant for HTN, GERD, depression, rheumatoid arthritis, interstitial lung disease/pulmonary fibrosis, IBS who presents to the ED with right hip pain following an accidental fall after she walked into a door.  She also sustained a laceration to her forehead.  ED course and data review: Vitals within normal limits except for slightly elevated blood pressure of 141/76.  Labs with WBC 14,000 and otherwise normal CBC and BMP with creatinine 1.17 which is above most recent baseline of 0.9 on 02/2021.  Imaging: CT head showed a small right frontal scalp laceration with no acute intracranial abnormality or skull fracture  CT C-spine showing multilevel degenerative disc disease without acute fracture  X-rays hip shows essentially nondisplaced right superior and inferior multiple pubic rami fractures at the pubic body.    PT Comments    Patient is agreeable to PT. She was oriented to self, place, situation, but not time. She is able to follow single step commands consistently. She continues to have right groin discomfort with mobility. She can stand and ambulate a short distance with assistance using rolling walker and verbal cues for safety. Standing activity tolerance limited by fatigue. Recommend to continue PT to facilitate return to prior level of independence. She is not at her baseline level of mobility currently and SNF recommended.    Recommendations for follow up therapy are one component of a multi-disciplinary discharge planning process, led by the attending physician.  Recommendations may be updated based on patient status, additional functional criteria and insurance authorization.  Follow Up Recommendations  Skilled nursing-short term rehab (<3 hours/day) Can patient physically be  transported by private vehicle: Yes   Assistance Recommended at Discharge Intermittent Supervision/Assistance  Patient can return home with the following A little help with walking and/or transfers;A little help with bathing/dressing/bathroom;Assistance with cooking/housework;Assistance with feeding;Assist for transportation;Help with stairs or ramp for entrance   Equipment Recommendations  None recommended by PT    Recommendations for Other Services       Precautions / Restrictions Precautions Precautions: Fall Restrictions Weight Bearing Restrictions: No     Mobility  Bed Mobility               General bed mobility comments: not assessed as patient sitting up on arrival and post session    Transfers Overall transfer level: Needs assistance Equipment used: Rolling walker (2 wheels) Transfers: Sit to/from Stand Sit to Stand: Min guard           General transfer comment: verbal cues for hand placement with standing from recliner chair    Ambulation/Gait Ambulation/Gait assistance: Min guard Gait Distance (Feet): 45 Feet Assistive device: Rolling walker (2 wheels) Gait Pattern/deviations: Step-to pattern Gait velocity: decreased     General Gait Details: verbal cues for rolling walker and BLE sequencing. Min guard provided for safety. standing activity tolerance limited by fatigue. heart rate does increase briefly to 132bpm while walking   Stairs             Wheelchair Mobility    Modified Rankin (Stroke Patients Only)       Balance Overall balance assessment: Needs assistance Sitting-balance support: Feet supported Sitting balance-Leahy Scale: Good     Standing balance support: Bilateral upper extremity supported Standing balance-Leahy Scale: Fair Standing balance comment: Min guard for safety  Cognition Arousal/Alertness: Awake/alert Behavior During Therapy: WFL for tasks assessed/performed Overall  Cognitive Status: Impaired/Different from baseline Area of Impairment: Safety/judgement, Memory, Orientation                 Orientation Level: Disoriented to, Time   Memory: Decreased recall of precautions, Decreased short-term memory   Safety/Judgement: Decreased awareness of safety, Decreased awareness of deficits     General Comments: patient is able to follow single step commands consistently        Exercises      General Comments        Pertinent Vitals/Pain Pain Assessment Pain Assessment: Faces Faces Pain Scale: Hurts a little bit Pain Location: R groin/hip area Pain Descriptors / Indicators: Guarding Pain Intervention(s): Limited activity within patient's tolerance, Monitored during session, Ice applied    Home Living                          Prior Function            PT Goals (current goals can now be found in the care plan section) Acute Rehab PT Goals Patient Stated Goal: to feel better PT Goal Formulation: With patient Time For Goal Achievement: 08/25/21 Potential to Achieve Goals: Good Progress towards PT goals: Progressing toward goals    Frequency    7X/week      PT Plan Current plan remains appropriate    Co-evaluation              AM-PAC PT "6 Clicks" Mobility   Outcome Measure  Help needed turning from your back to your side while in a flat bed without using bedrails?: None Help needed moving from lying on your back to sitting on the side of a flat bed without using bedrails?: A Little Help needed moving to and from a bed to a chair (including a wheelchair)?: A Little Help needed standing up from a chair using your arms (e.g., wheelchair or bedside chair)?: A Little Help needed to walk in hospital room?: A Little Help needed climbing 3-5 steps with a railing? : Total 6 Click Score: 17    End of Session Equipment Utilized During Treatment: Gait belt Activity Tolerance: Patient limited by fatigue Patient  left: in chair;with call bell/phone within reach;with chair alarm set (ice pack provided for R groin) Nurse Communication: Mobility status PT Visit Diagnosis: Unsteadiness on feet (R26.81);Other abnormalities of gait and mobility (R26.89);Repeated falls (R29.6);Muscle weakness (generalized) (M62.81);Difficulty in walking, not elsewhere classified (R26.2);Pain Pain - Right/Left: Right Pain - part of body: Hip     Time: 0910-0928 PT Time Calculation (min) (ACUTE ONLY): 18 min  Charges:  $Gait Training: 8-22 mins                     Minna Merritts, PT, MPT    Percell Locus 08/15/2021, 11:30 AM

## 2021-08-15 NOTE — TOC Progression Note (Signed)
Transition of Care [facility]) - Progression Note    Patient Details  Name: Stephanie Patel MRN: [id number] Date of Birth: 1945/05/19  Transition of Care [facility]) CM/SW [place], RN Phone Number: 08/15/2021, 8:05 AM  Clinical Narrative:   Spoke to the patient's daughter this morning and she requested to check with [facility] to see if they can accept the patient, I explained that they are out of network with the patient's insurance and she stated that they will pay out of pocket, I reached out to [place] at [facility] and she will check to see if they can offer a bed, the patient has been fully covid vaccinated and is willing to get another booster if needed prior to [place] was approved by [place] health to go [facility]    Expected Discharge Plan: [place] Barriers to Discharge: Barriers Resolved  Expected Discharge Plan and Services Expected Discharge Plan: [place]   Discharge Planning Services: CM Consult   Living arrangements for the past 2 months: [place] Expected Discharge Date: 08/12/21                 DME Agency: NA       HH Arranged: PT, OT HH Agency: [place] Date [facility] Agency Contacted: 08/11/21 Time [place]: 3300 Representative spoke with at [place]: [place] Determinants of Health ([place]) Interventions    Readmission Risk Interventions     No data to display

## 2021-08-15 NOTE — Plan of Care (Signed)
Patient discharged per MD orders at this time.All discharge instructions, education and medications reviewed with the patient.Pt expressed understanding and will comply with dc instructions.f/u appointments was also communicated to the patient.no verbal c/o or any ssx of distress.Pt was discharged to the [facility] and rehab for PT/OT services per order.report was called to staff nurse Boykin Peek before discharge.Pt was transported by her brother in a privately owned vehicle.

## 2021-08-15 NOTE — TOC Progression Note (Signed)
Transition of Care [facility]) - Progression Note    Patient Details  Name: Stephanie Patel MRN: [id number] Date of Birth: 12-22-1945  Transition of Care [facility]) CM/SW [place], RN Phone Number: 08/15/2021, 2:26 PM  Clinical Narrative:    Spoke to the patient's brother and he will be here to get the patient to transport to [facility] at 3 PM, I notified The bed nurse   Expected Discharge Plan: [place] Barriers to Discharge: Barriers Resolved  Expected Discharge Plan and Services Expected Discharge Plan: [place]   Discharge Planning Services: CM Consult   Living arrangements for the past 2 months: [place] Expected Discharge Date: 08/15/21                 DME Agency: NA       HH Arranged: PT, OT HH Agency: [place] Date [facility] Agency Contacted: 08/11/21 Time [place]: 3810 Representative spoke with at [place]: [place] ([place]) Interventions    Readmission Risk Interventions    08/15/2021    9:53 AM  Readmission Risk Prevention Plan  Transportation Screening Complete  PCP or Specialist Appt within 3-5 Days Complete  HRI or [place] Complete  Social Work Consult for [place] Planning/Counseling Complete  Palliative Care Screening Not Applicable  Medication Review Press photographer) Complete

## 2021-08-15 NOTE — Progress Notes (Signed)
Pt is having frequent urination. After void bladder scan 482ms. BSharion Settlermade aware. New orders received. Will continue to monitor pt.

## 2021-08-15 NOTE — TOC Progression Note (Signed)
Transition of Care [facility]) - Progression Note    Patient Details  Name: Stephanie Patel MRN: [id number] Date of Birth: Mar 21, 1945  Transition of Care [facility]) CM/SW [place], RN Phone Number: 08/15/2021, 9:02 AM  Clinical Narrative:  [employer] is updating approval to go to SNF to [facility],   Approved 8/11 - 8/15, next review due 8/15.  Reference ID: [id number].  Plan auth still generating at this time.  Lady [place] at [facility] of the approval  Expected Discharge Plan: [place] Barriers to Discharge: Barriers Resolved  Expected Discharge Plan and Services Expected Discharge Plan: [place]   Discharge Planning Services: CM Consult   Living arrangements for the past 2 months: [place] Expected Discharge Date: 08/12/21                 DME Agency: NA       HH Arranged: PT, OT HH Agency: [place] Date [facility] Agency Contacted: 08/11/21 Time [place]: [phone] Representative spoke with at [place]: [place] Determinants of Health ([place]) Interventions    Readmission Risk Interventions     No data to display

## 2021-08-17 LAB — URINE CULTURE: Culture: >=100000 — AB

## 2021-08-28 ENCOUNTER — Ambulatory Visit: Payer: TRICARE For Life (TFL) | Admitting: Pulmonary Disease

## 2021-08-28 LAB — BASIC METABOLIC PANEL
BUN: 14 (ref 4–21)
CO2: 30 — AB (ref 13–22)
Chloride: 105 (ref 99–108)
Creatinine: 1 (ref 0.5–1.1)
Glucose: 84
Potassium: 4.7 mEq/L (ref 3.5–5.1)
Sodium: 138 (ref 137–147)

## 2021-08-28 LAB — CBC AND DIFFERENTIAL
HCT: 34 — AB (ref 36–46)
Hemoglobin: 11.4 — AB (ref 12.0–16.0)
Neutrophils Absolute: 3351
Platelets: 188 10*3/uL (ref 150–400)
WBC: 7.1

## 2021-08-28 LAB — COMPREHENSIVE METABOLIC PANEL
Calcium: 9 (ref 8.7–10.7)
eGFR: 60

## 2021-08-28 LAB — CBC: RBC: 3.46 — AB (ref 3.87–5.11)

## 2021-11-24 ENCOUNTER — Other Ambulatory Visit: Payer: Self-pay | Admitting: Pulmonary Disease

## 2021-11-24 NOTE — Telephone Encounter (Signed)
It is on the last OV med list, last mentioned.  Okay to refill Breo Ellipta 100/25, 1 puff daily.

## 2021-11-24 NOTE — Telephone Encounter (Signed)
Patient is requesting refills on Breo.  Rx not mentioned in last OV note or listed on med list.  Dr. Patsey Berthold, please advise. Thanks
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
## 2022-02-19 ENCOUNTER — Encounter: Payer: Self-pay | Admitting: Psychiatry

## 2022-02-19 ENCOUNTER — Ambulatory Visit: Payer: Medicare PPO | Admitting: Psychiatry

## 2022-02-19 VITALS — BP 121/80 | HR 80 | Temp 97.7°F | Ht 60.0 in | Wt 114.8 lb

## 2022-02-19 DIAGNOSIS — F330 Major depressive disorder, recurrent, mild: Secondary | ICD-10-CM

## 2022-02-19 MED ORDER — BUPROPION HCL ER (XL) 150 MG PO TB24: 150.0000 mg | ORAL_TABLET | Freq: Every day | ORAL | 2 refills | Status: DC

## 2022-03-12 IMAGING — MR MR SHOULDER*R* W/O CM
6 series · 40 of 40 positions shown · non-contrast
Comparison: Plain films right shoulder 06/06/2019.

CLINICAL DATA: Right shoulder pain for 1 month since a fall.

EXAM:
MRI OF THE RIGHT SHOULDER WITHOUT CONTRAST
TECHNIQUE: Multiplanar, multisequence MR imaging of the shoulder was performed.
No intravenous contrast was administered.

[Series 3: PD fat-sat · axial · right · 4.0mm · 0.55mm/px · z∈[-45,+85]mm · 7 of 28 slices shown (1 of 2)]
[im 1/28]
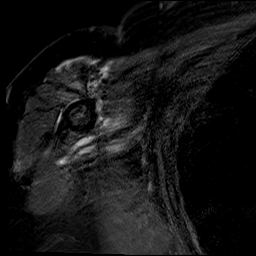
[im 5/28]
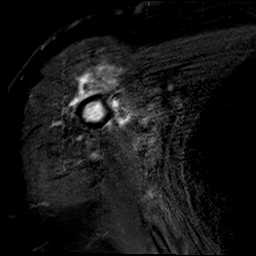
[im 10/28]
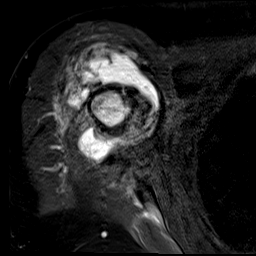
[im 14/28]
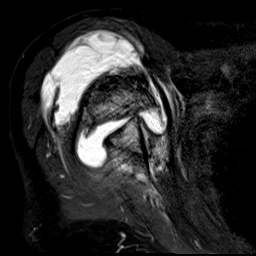
[im 19/28]
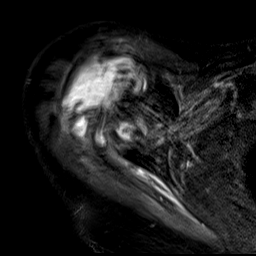
[im 23/28]
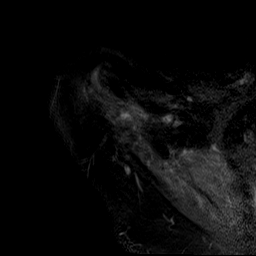
[im 28/28]
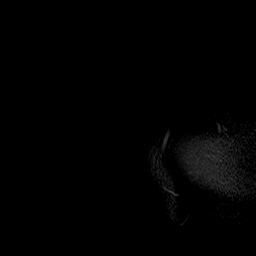

[Series 4: T2 fat-sat · oblique · right · 4.0mm · 0.62mm/px · 6 of 25 slices shown (1 of 2)]
[im 1/25]
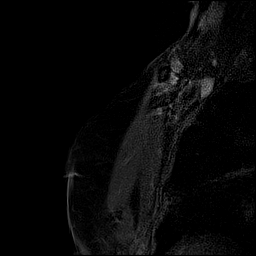
[im 5/25]
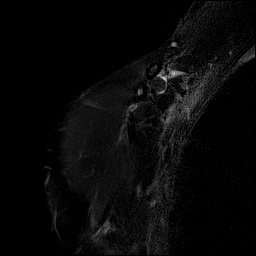
[im 10/25]
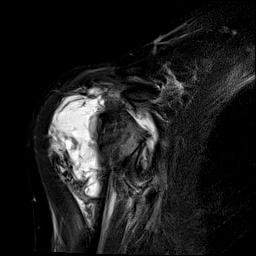
[im 15/25]
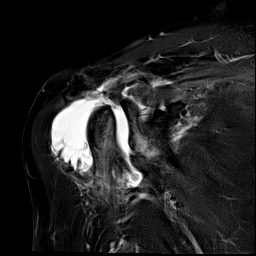
[im 20/25]
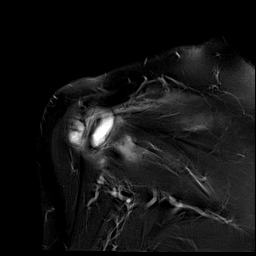
[im 25/25]
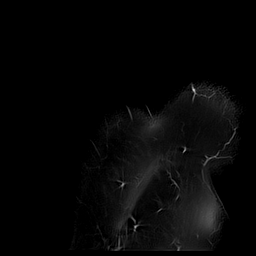

[Series 5: PD fat-sat · oblique · right · 4.0mm · 0.62mm/px · 6 of 25 slices shown (2 of 2)]
[im 1/25]
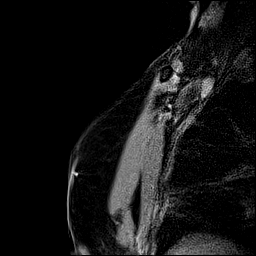
[im 5/25]
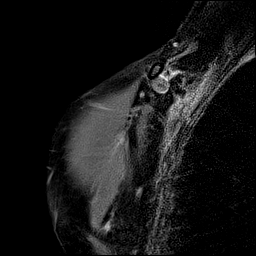
[im 10/25]
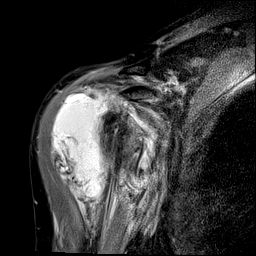
[im 15/25]
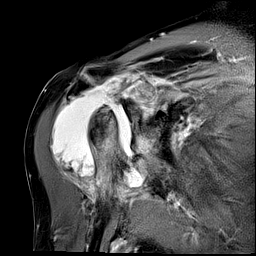
[im 20/25]
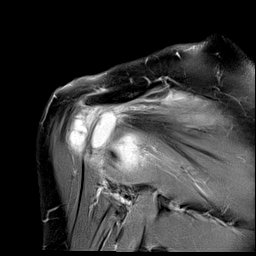
[im 25/25]
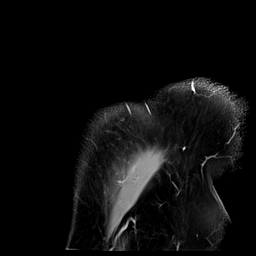

[Series 6: T2 fat-sat · oblique · right · 4.0mm · 0.62mm/px · 7 of 27 slices shown (2 of 2)]
[im 1/27]
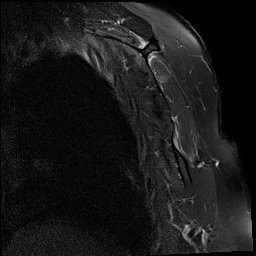
[im 5/27]
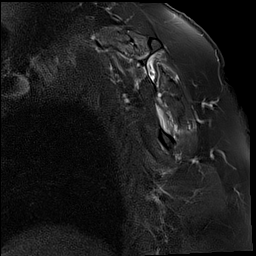
[im 9/27]
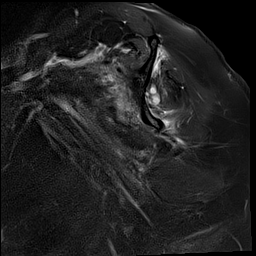
[im 14/27]
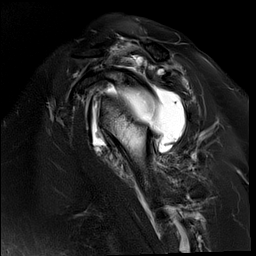
[im 18/27]
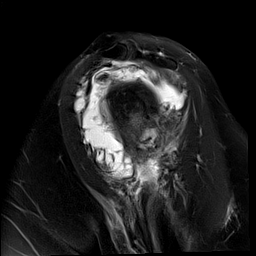
[im 22/27]
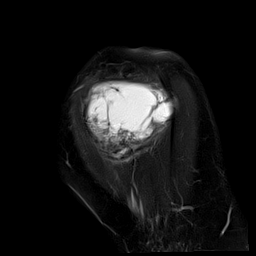
[im 27/27]
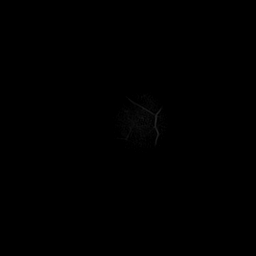

[Series 7: T1 · oblique · right · 4.0mm · 0.62mm/px · 7 of 27 slices shown]
[im 1/27]
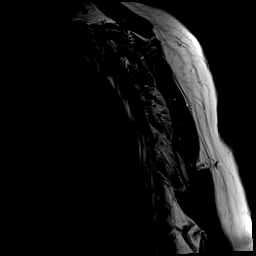
[im 5/27]
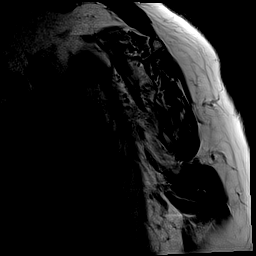
[im 9/27]
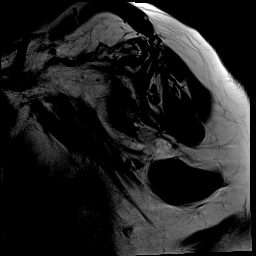
[im 14/27]
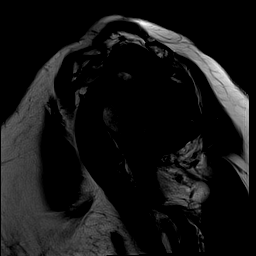
[im 18/27]
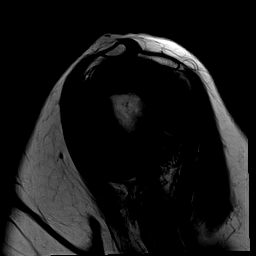
[im 22/27]
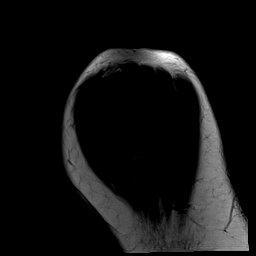
[im 27/27]
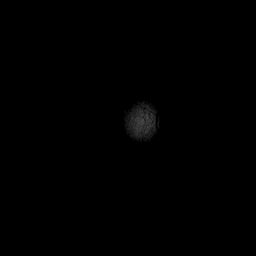

[Series 8: STIR · axial · right · 4.0mm · 0.62mm/px · z∈[-45,+85]mm · 7 of 28 slices shown]
[im 1/28]
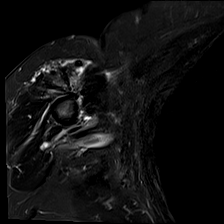
[im 5/28]
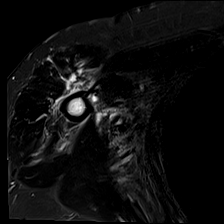
[im 10/28]
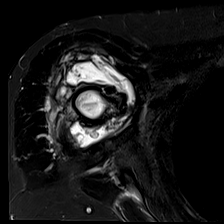
[im 14/28]
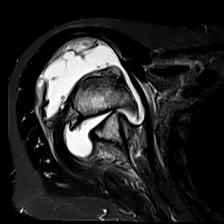
[im 19/28]
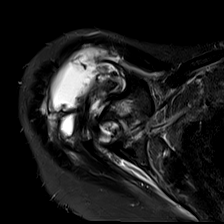
[im 23/28]
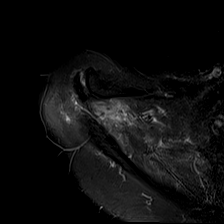
[im 28/28]
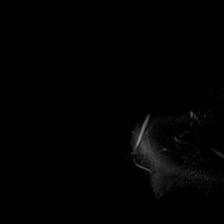

[40 of 40 positions shown; findings below may reference images not displayed]

FINDINGS: Rotator cuff: The supraspinatus and subscapularis are completely
torn. The supraspinatus is retracted medial to the glenoid, 3-4 cm.
The subscapularis is retracted 1-2 cm. Infraspinatus tendinopathy
without tear is seen. The teres minor is intact.

Muscles: There is severe subscapularis and moderate supraspinatus
atrophy. The infraspinatus appears preserved.

Biceps long head: Somewhat difficult to visualize but appears
intact.

Acromioclavicular Joint: Mild-to-moderate osteoarthritis. Type 2
acromion. There is a massive fluid collection in the
subacromial/subdeltoid bursa with debris and septations present.

Glenohumeral Joint: Cartilage is completely denuded. The humeral
head is markedly remodeled. The glenoid appears somewhat flattened.
There is marked marrow edema about the joint most consistent with
stress change due to altered mechanics.

Labrum: Not visualized consistent with diffuse degenerative
maceration.

Bones:  No fracture or worrisome lesion.

Other: None.
IMPRESSION: No acute abnormality.

Grossly abnormal appearance of the glenohumeral joint with marked
remodeling of the humeral head, complete denuding of hyaline
cartilage and degenerative maceration of the labrum most consistent
with the patient's history of rheumatoid arthritis.

Complete supraspinatus and subscapularis tendon tears with 3-4 cm of
supraspinatus retraction and 1-2 cm of subscapularis retraction.
Severe subscapularis and moderate supraspinatus atrophy are seen.

Massive volume of complex fluid in the subacromial/subdeltoid bursa
consistent with bursitis.

## 2022-03-26 ENCOUNTER — Emergency Department: Payer: Medicare PPO

## 2022-03-26 ENCOUNTER — Emergency Department
Admission: EM | Admit: 2022-03-26 | Discharge: 2022-03-30 | Disposition: A | Discharge status: Skilled Nursing Facility | Payer: Medicare PPO | Attending: Emergency Medicine | Admitting: Emergency Medicine

## 2022-03-26 DIAGNOSIS — Z85828 Personal history of other malignant neoplasm of skin: Secondary | ICD-10-CM | POA: Insufficient documentation

## 2022-03-26 DIAGNOSIS — I10 Essential (primary) hypertension: Secondary | ICD-10-CM | POA: Insufficient documentation

## 2022-03-26 DIAGNOSIS — Z20822 Contact with and (suspected) exposure to covid-19: Secondary | ICD-10-CM | POA: Insufficient documentation

## 2022-03-26 DIAGNOSIS — K8020 Calculus of gallbladder without cholecystitis without obstruction: Secondary | ICD-10-CM | POA: Diagnosis not present

## 2022-03-26 DIAGNOSIS — Z91010 Allergy to peanuts: Secondary | ICD-10-CM | POA: Diagnosis not present

## 2022-03-26 DIAGNOSIS — R531 Weakness: Secondary | ICD-10-CM | POA: Diagnosis not present

## 2022-03-26 DIAGNOSIS — R110 Nausea: Secondary | ICD-10-CM

## 2022-03-26 DIAGNOSIS — R42 Dizziness and giddiness: Secondary | ICD-10-CM | POA: Insufficient documentation

## 2022-03-26 LAB — CBC WITH DIFFERENTIAL/PLATELET
Abs Immature Granulocytes: 0.03 10*3/uL (ref 0.00–0.07)
Basophils Absolute: 0 10*3/uL (ref 0.0–0.1)
Basophils Relative: 0 %
Eosinophils Absolute: 0.1 10*3/uL (ref 0.0–0.5)
Eosinophils Relative: 1 %
HCT: 40.1 % (ref 36.0–46.0)
Hemoglobin: 13.1 g/dL (ref 12.0–15.0)
Immature Granulocytes: 0 %
Lymphocytes Relative: 19 %
Lymphs Abs: 1.6 10*3/uL (ref 0.7–4.0)
MCH: 33.2 pg (ref 26.0–34.0)
MCHC: 32.7 g/dL (ref 30.0–36.0)
MCV: 101.8 fL — ABNORMAL HIGH (ref 80.0–100.0)
Monocytes Absolute: 0.8 10*3/uL (ref 0.1–1.0)
Monocytes Relative: 9 %
Neutro Abs: 5.9 10*3/uL (ref 1.7–7.7)
Neutrophils Relative %: 71 %
Platelets: 192 10*3/uL (ref 150–400)
RBC: 3.94 MIL/uL (ref 3.87–5.11)
RDW: 13.4 % (ref 11.5–15.5)
WBC: 8.4 10*3/uL (ref 4.0–10.5)
nRBC: 0 % (ref 0.0–0.2)

## 2022-03-26 LAB — TROPONIN I (HIGH SENSITIVITY)
Troponin I (High Sensitivity): 23 ng/L — ABNORMAL HIGH (ref <18)
Troponin I (High Sensitivity): 22 ng/L — ABNORMAL HIGH (ref <18)

## 2022-03-26 LAB — URINALYSIS, ROUTINE W REFLEX MICROSCOPIC
Bilirubin Urine: NEGATIVE
Glucose, UA: NEGATIVE mg/dL
Hgb urine dipstick: NEGATIVE
Ketones, ur: NEGATIVE mg/dL
Leukocytes,Ua: NEGATIVE
Nitrite: NEGATIVE
Protein, ur: NEGATIVE mg/dL
Specific Gravity, Urine: 1.039 — ABNORMAL HIGH (ref 1.005–1.030)
pH: 5 (ref 5.0–8.0)

## 2022-03-26 LAB — COMPREHENSIVE METABOLIC PANEL
ALT: 17 U/L (ref 0–44)
AST: 35 U/L (ref 15–41)
Albumin: 3.7 g/dL (ref 3.5–5.0)
Alkaline Phosphatase: 68 U/L (ref 38–126)
Anion gap: 9 (ref 5–15)
BUN: 12 mg/dL (ref 8–23)
CO2: 21 mmol/L — ABNORMAL LOW (ref 22–32)
Calcium: 9.8 mg/dL (ref 8.9–10.3)
Chloride: 102 mmol/L (ref 98–111)
Creatinine, Ser: 1.11 mg/dL — ABNORMAL HIGH (ref 0.44–1.00)
GFR, Estimated: 51 mL/min — ABNORMAL LOW (ref >60)
Glucose, Bld: 93 mg/dL (ref 70–99)
Potassium: 4.1 mmol/L (ref 3.5–5.1)
Sodium: 132 mmol/L — ABNORMAL LOW (ref 135–145)
Total Bilirubin: 1.2 mg/dL (ref 0.3–1.2)
Total Protein: 6.8 g/dL (ref 6.5–8.1)

## 2022-03-26 LAB — RESP PANEL BY RT-PCR (RSV, FLU A&B, COVID)  RVPGX2
Influenza A by PCR: NEGATIVE
Influenza B by PCR: NEGATIVE
Resp Syncytial Virus by PCR: NEGATIVE
SARS Coronavirus 2 by RT PCR: NEGATIVE

## 2022-03-26 LAB — LIPASE, BLOOD: Lipase: 29 U/L (ref 11–51)

## 2022-03-26 MED ORDER — SODIUM CHLORIDE 0.9 % IV BOLUS
1000.0000 mL | Freq: Once | INTRAVENOUS | Status: AC
  Administered 2022-03-26: 1000 mL via INTRAVENOUS

## 2022-03-26 MED ORDER — IOHEXOL 300 MG/ML  SOLN
100.0000 mL | Freq: Once | INTRAMUSCULAR | Status: AC | PRN
  Administered 2022-03-26: 100 mL via INTRAVENOUS

## 2022-03-26 NOTE — ED Notes (Signed)
Answered pt's call light. Assisted pt to the bedside commode. Pt unsteady on her feet. Able to get back in bed and hooked up to the monitor.

## 2022-03-26 NOTE — ED Triage Notes (Signed)
Patient BIB [facility]. Patient states that she is dizzy. Patient had a fall at home and denies hitting head.  Patient states that she has had numerous falls this past month and has been getting weaker. Patient also states that she needs a new right knee due to RA in right knee. She also has a new onset tremor.

## 2022-03-26 NOTE — Progress Notes (Signed)
TOC acknowledges consult for SNF vs HH. Pending PT evals for appropriate dispo planning.   Kelby Fam, [place], MSW, [place]

## 2022-03-26 NOTE — ED Notes (Signed)
The pt advised she need to use the restroom. The pt was assisted to her feet. The pt was unsteady on her feet so she was asked to sit back in bed. A purwick was placed on the pt and her bed linen was straightened.

## 2022-03-26 NOTE — ED Notes (Signed)
Patient unable to complete Orthostatic B/P. Patient only able to stand for short amounts of time.

## 2022-03-26 NOTE — ED Notes (Signed)
Answered the pt's call light. Assisted pt to the bedside commode. Pt a little more steady on her feet. Pt brushed her teeth independently. RN was within arms reach while pt was at the sink. Pt back in bed denies further need at this time.

## 2022-03-26 NOTE — ED Notes (Signed)
Report given to Alycia RN

## 2022-03-26 NOTE — ED Notes (Signed)
The pt was given snacks and a beverage.
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
## 2022-03-26 NOTE — ED Notes (Signed)
Family at bedside. 

## 2022-03-26 NOTE — ED Notes (Signed)
Answered pt call light and got pt her phone and a spoon. Pt denies any further needs at this time.

## 2022-03-26 NOTE — ED Notes (Signed)
64 yof lying supine in the bed. The pt was warm, pink, and dry. The pt denied any pain. The pt was alert and oriented to most questions.

## 2022-03-26 NOTE — Evaluation (Signed)
Physical Therapy Evaluation Patient Details Name: Stephanie Patel MRN: [id number] DOB: 10-28-1945 Today's Date: 03/26/2022  History of Present Illness  Pt is a 77 y/o F admitted on 03/26/22 after presenting to the ED with c/o generalized weakness & a fall at home. Abdominal US revealed cholelithiasis. PMH: depression, dysarthria, heart murmur, HTN, IBS, ILD, RA,  Clinical Impression  Pt seen for PT evaluation with pt agreeable, daughter present in room. Pt requires extra time to answer orientation questions but answers them correctly; pt's daughter notes pt has hx of dementia & cognition waxes & wanes. Pt notes she lives alone & is typically independent without AD but has used RW for the past 3 weeks although daughter denies this. On this date, pt requires mod<>max assist for bed mobility & max assist to ambulate to toilet & back with RUE HHA.  Pt with significant balance, strength, & safety awareness deficits. Pt is unsafe to return home alone. Recommend STR upon d/c.   Recommendations for follow up therapy are one component of a multi-disciplinary discharge planning process, led by the attending physician.  Recommendations may be updated based on patient status, additional functional criteria and insurance authorization.  Follow Up Recommendations Skilled nursing-short term rehab (<3 hours/day) Can patient physically be transported by private vehicle: No    Assistance Recommended at Discharge Frequent or constant Supervision/Assistance  Patient can return home with the following  A lot of help with walking and/or transfers;A lot of help with bathing/dressing/bathroom;Assist for transportation;Help with stairs or ramp for entrance;Direct supervision/assist for medications management;Direct supervision/assist for financial management;Assistance with cooking/housework    Equipment Recommendations None recommended by PT  Recommendations for Other Services  OT consult    Functional Status  Assessment Patient has had a recent decline in their functional status and demonstrates the ability to make significant improvements in function in a reasonable and predictable amount of time.     Precautions / Restrictions Precautions Precautions: Fall Restrictions Weight Bearing Restrictions: No      Mobility  Bed Mobility Overal bed mobility: Needs Assistance Bed Mobility: Supine to Sit, Sit to Supine     Supine to sit: Max assist, HOB elevated Sit to supine: Mod assist   General bed mobility comments: hospital stretcher    Transfers Overall transfer level: Needs assistance Equipment used: None, 1 person hand held assist Transfers: Sit to/from Stand Sit to Stand: Max assist           General transfer comment: STS from low toilet with cuing to use grab bar with max assist.    Ambulation/Gait Ambulation/Gait assistance: Max assist Gait Distance (Feet): 15 Feet (+ 15 ft) Assistive device: 1 person hand held assist Gait Pattern/deviations: Decreased step length - right, Decreased step length - left, Decreased dorsiflexion - right, Decreased dorsiflexion - left, Decreased stride length Gait velocity: decreased     General Gait Details: Pt with improved balance when holding to PT with RUE. Pt still frequently reaching for objects for support. Pt with decreased weight shifting RLE to allow increased ease of stepping LLE.  Stairs            Wheelchair Mobility    Modified Rankin (Stroke Patients Only)       Balance Overall balance assessment: Needs assistance, History of Falls Sitting-balance support: Feet supported, Bilateral upper extremity supported Sitting balance-Leahy Scale: Poor Sitting balance - Comments: Posterior lean sitting on elevated edge of ED stretcher   Standing balance support: During functional activity, Single extremity supported Standing balance-Leahy  Scale: Poor                               Pertinent Vitals/Pain Pain  Assessment Pain Assessment: Faces Faces Pain Scale: No hurt    Home Living Family/patient expects to be discharged to:: Private residence Living Arrangements: Alone Available Help at Discharge: Friend(s);Available PRN/intermittently Type of Home:  (condo) Home Access: Level entry       Home Layout: Two level;Able to live on main level with bedroom/bathroom Home Equipment: Rolling Walker (2 wheels);Shower seat;BSC/3in1;Shower seat - built in      Prior Function               Mobility Comments: Pt reports she has a RW that she started using in the past 3 weeks but daughter denies this. Pt lives alone, still driving, but has had 6 falls in the past 6 months, another fall this past fall resulting in pt going to STR. ADLs Comments: Pt reports she bathes & dresses herself, cookes simple meals, has someone clean for her. Reports pharmacy fills her pill box for her.     Hand Dominance        Extremity/Trunk Assessment   Upper Extremity Assessment Upper Extremity Assessment: Generalized weakness    Lower Extremity Assessment Lower Extremity Assessment: Generalized weakness       Communication   Communication: No difficulties  Cognition Arousal/Alertness: Awake/alert Behavior During Therapy: WFL for tasks assessed/performed Overall Cognitive Status: History of cognitive impairments - at baseline                                 General Comments: Per daughter pt has a hx of dementia, pt required extra time to recall current year & month without assistance but self corrected several times. Oriented to self, location & situation.        General Comments General comments (skin integrity, edema, etc.): Pt with continent void on toilet, performs peri hygiene    Exercises     Assessment/Plan    PT Assessment Patient needs continued PT services  PT Problem List Decreased strength;Decreased activity tolerance;Decreased balance;Decreased mobility;Decreased  knowledge of precautions;Decreased safety awareness;Decreased knowledge of use of DME;Decreased cognition       PT Treatment Interventions DME instruction;Therapeutic exercise;Gait training;Balance training;Stair training;Neuromuscular re-education;Functional mobility training;Patient/family education;Therapeutic activities    PT Goals (Current goals can be found in the Care Plan section)  Acute Rehab PT Goals Patient Stated Goal: get better PT Goal Formulation: With patient/family Time For Goal Achievement: 04/09/22 Potential to Achieve Goals: Fair    Frequency Min 2X/week     Co-evaluation               AM-PAC PT "6 Clicks" Mobility  Outcome Measure Help needed turning from your back to your side while in a flat bed without using bedrails?: A Lot Help needed moving from lying on your back to sitting on the side of a flat bed without using bedrails?: A Lot Help needed moving to and from a bed to a chair (including a wheelchair)?: Total Help needed standing up from a chair using your arms (e.g., wheelchair or bedside chair)?: Total Help needed to walk in hospital room?: Total Help needed climbing 3-5 steps with a railing? : Total 6 Click Score: 8    End of Session   Activity Tolerance: Patient tolerated treatment well Patient left: in  bed;with call bell/phone within reach   PT Visit Diagnosis: Muscle weakness (generalized) (M62.81);History of falling (Z91.81);Other abnormalities of gait and mobility (R26.89);Difficulty in walking, not elsewhere classified (R26.2);Unsteadiness on feet (R26.81)    Time: [id number] PT Time Calculation (min) (ACUTE ONLY): 15 min   Charges:   PT Evaluation $PT Eval High Complexity: 1 High PT Treatments $Therapeutic Activity: 8-22 mins        Lavone Nian, PT, DPT 03/26/22, 3:12 PM   Waunita Schooner 03/26/2022, 3:11 PM

## 2022-03-27 DIAGNOSIS — R42 Dizziness and giddiness: Secondary | ICD-10-CM | POA: Diagnosis not present

## 2022-03-27 MED ORDER — BUPROPION HCL ER (XL) 150 MG PO TB24
300.0000 mg | ORAL_TABLET | Freq: Every day | ORAL | Status: DC
  Administered 2022-03-27 – 2022-03-30 (×4): 300 mg via ORAL
  Filled 2022-03-27 (×5): qty 2

## 2022-03-27 MED ORDER — VENLAFAXINE HCL ER 75 MG PO CP24
75.0000 mg | ORAL_CAPSULE | Freq: Every day | ORAL | Status: DC
  Administered 2022-03-28 – 2022-03-30 (×3): 75 mg via ORAL
  Filled 2022-03-27 (×3): qty 1

## 2022-03-27 MED ORDER — METHOTREXATE SODIUM 2.5 MG PO TABS: 12.5000 mg | ORAL_TABLET | ORAL | Status: DC

## 2022-03-27 MED ORDER — ACETAMINOPHEN 325 MG PO TABS
650.0000 mg | ORAL_TABLET | Freq: Four times a day (QID) | ORAL | Status: DC | PRN
  Administered 2022-03-27: 650 mg via ORAL
  Filled 2022-03-27: qty 2

## 2022-03-27 MED ORDER — FLUDROCORTISONE ACETATE 0.1 MG PO TABS
0.1000 mg | ORAL_TABLET | Freq: Every day | ORAL | Status: DC
  Administered 2022-03-27 – 2022-03-30 (×4): 0.1 mg via ORAL
  Filled 2022-03-27 (×4): qty 1

## 2022-03-27 MED ORDER — FLUTICASONE FUROATE-VILANTEROL 100-25 MCG/ACT IN AEPB: 1.0000 | INHALATION_SPRAY | Freq: Every day | RESPIRATORY_TRACT | Status: DC | PRN

## 2022-03-27 MED ORDER — MESALAMINE 1.2 G PO TBEC
2.4000 g | DELAYED_RELEASE_TABLET | Freq: Every day | ORAL | Status: DC
  Administered 2022-03-27 – 2022-03-30 (×4): 2.4 g via ORAL
  Filled 2022-03-27 (×4): qty 2

## 2022-03-27 MED ORDER — DESVENLAFAXINE SUCCINATE ER 50 MG PO TB24
50.0000 mg | ORAL_TABLET | Freq: Every day | ORAL | Status: DC
  Filled 2022-03-27 (×3): qty 1

## 2022-03-27 MED ORDER — MEMANTINE HCL 5 MG PO TABS
10.0000 mg | ORAL_TABLET | Freq: Two times a day (BID) | ORAL | Status: DC
  Administered 2022-03-27 – 2022-03-30 (×7): 10 mg via ORAL
  Filled 2022-03-27 (×8): qty 2

## 2022-03-27 MED ORDER — ONDANSETRON HCL 4 MG PO TABS
4.0000 mg | ORAL_TABLET | Freq: Three times a day (TID) | ORAL | Status: DC | PRN
  Administered 2022-03-27 – 2022-03-28 (×2): 4 mg via ORAL
  Filled 2022-03-27 (×2): qty 1

## 2022-03-27 MED ORDER — BUPROPION HCL ER (XL) 150 MG PO TB24
150.0000 mg | ORAL_TABLET | Freq: Every day | ORAL | Status: DC
  Administered 2022-03-27 – 2022-03-30 (×4): 150 mg via ORAL
  Filled 2022-03-27 (×4): qty 1

## 2022-03-27 MED ORDER — CYANOCOBALAMIN 500 MCG PO TABS
1000.0000 ug | ORAL_TABLET | Freq: Every day | ORAL | Status: DC
  Administered 2022-03-27 – 2022-03-30 (×4): 1000 ug via ORAL
  Filled 2022-03-27 (×4): qty 2

## 2022-03-27 MED ORDER — FLUTICASONE PROPIONATE 50 MCG/ACT NA SUSP: 1.0000 | Freq: Every day | NASAL | Status: DC | PRN

## 2022-03-27 MED ORDER — FOLIC ACID 1 MG PO TABS
1.0000 mg | ORAL_TABLET | Freq: Every day | ORAL | Status: DC
  Administered 2022-03-27 – 2022-03-30 (×4): 1 mg via ORAL
  Filled 2022-03-27 (×4): qty 1

## 2022-03-27 NOTE — NC FL2 (Addendum)
[place] LEVEL OF CARE FORM     IDENTIFICATION  Patient Name: Stephanie Patel Birthdate: May 31, 1945 Sex: female Admission Date (Current Location): 03/26/2022  [place] and [place] Number:  Engineering geologist and Address:  [facility], [address], [place], [place] [address]      Provider Number: [id number]  Attending Physician Name and Address:  No att. providers found  Relative Name and Phone Number:  Josetta Huddle [phone]    Pearson Forster Daughter   [phone]    Current Level of Care: Hospital Recommended Level of Care: [place] Prior Approval Number:    Date Approved/Denied:   PASRR Number: [id number]  Discharge Plan: SNF    Current Diagnoses: Patient Active Problem List   Diagnosis Date Noted   UTI (urinary tract infection) 08/15/2021   Irregular heart rhythm 08/13/2021   Closed fracture of multiple pubic rami, right, initial encounter ([place]) 08/11/2021   Laceration of forehead 08/11/2021   Accidental fall 08/11/2021   Pelvic fracture ([place]) 08/11/2021   GIB (gastrointestinal bleeding) 09/19/2019   GERD (gastroesophageal reflux disease) 09/19/2019   Hypokalemia 09/19/2019   CAP (community acquired pneumonia) 11/08/2016   Right inguinal hernia 09/29/2016   HTN, goal below 140/80 12/26/2015   ILD (interstitial lung disease) ([place]) 03/28/2015   Acute respiratory failure with hypoxia ([place]) 03/18/2015   Closed torus fracture of distal end of right fibula 10/03/2013   Depression 08/21/2013   Environmental allergies 08/21/2013   IBS (irritable bowel syndrome) 08/21/2013   LVH (left ventricular hypertrophy) 08/21/2013   Osteoarthritis of knee 08/21/2013   Vitamin D deficiency, unspecified 08/21/2013   Rheumatoid arthritis of multiple sites with negative rheumatoid factor ([place]) 02/10/2012    Orientation RESPIRATION BLADDER Height & Weight     Self, Time, Situation, Place  Normal Continent  Weight: 113 lb (51.3 kg) Height:  5' (152.4 cm)  BEHAVIORAL SYMPTOMS/MOOD NEUROLOGICAL BOWEL NUTRITION STATUS      Continent Diet (regular)  AMBULATORY STATUS COMMUNICATION OF NEEDS Skin   Limited Assist Verbally Normal                       Personal Care Assistance Level of Assistance  Bathing, Feeding, Dressing Bathing Assistance: Limited assistance Feeding assistance: Independent Dressing Assistance: Limited assistance     Functional Limitations Info  Sight, Hearing, Speech Sight Info: Adequate Hearing Info: Adequate Speech Info: Adequate    SPECIAL CARE FACTORS FREQUENCY  PT (By licensed PT), OT (By licensed OT)     PT Frequency: Minimum 5x a week OT Frequency: Minimum 5x a week            Contractures Contractures Info: Not present    Additional Factors Info  Code Status, Allergies, Psychotropic Code Status Info: Full Code Allergies Info: Keflex (Cephalexin)  Peanut-containing Drug Products  Aricept (Donepezil)  Codeine  Hydrochlorothiazide Psychotropic Info: buPROPion (WELLBUTRIN XL) 24 hr tablet 150 mg, buPROPion (WELLBUTRIN XL) 24 hr tablet 300 mg, venlafaxine XR (EFFEXOR-XR) 24 hr capsule 75 mg         Current Medications (03/27/2022):  This is the current hospital active medication list Current Facility-Administered Medications  Medication Dose Route Frequency Provider Last Rate Last Admin   acetaminophen (TYLENOL) tablet 650 mg  650 mg Oral Q6H PRN Arta Silence, MD       buPROPion (WELLBUTRIN XL) 24 hr tablet 150 mg  150 mg Oral Daily Arta Silence, MD   150 mg at 03/27/22 1049  buPROPion (WELLBUTRIN XL) 24 hr tablet 300 mg  300 mg Oral Daily Arta Silence, MD   300 mg at 03/27/22 1055   cyanocobalamin (VITAMIN B12) tablet 1,000 mcg  1,000 mcg Oral Daily Arta Silence, MD   1,000 mcg at 03/27/22 1051   fludrocortisone (FLORINEF) tablet 0.1 mg  0.1 mg Oral Daily Arta Silence, MD   0.1 mg at 03/27/22 1050   fluticasone  (FLONASE) 50 MCG/ACT nasal spray 1 spray  1 spray Each Nare Daily PRN Arta Silence, MD       fluticasone furoate-vilanterol (BREO ELLIPTA) 100-25 MCG/ACT 1 puff  1 puff Inhalation Daily PRN Arta Silence, MD       folic acid (FOLVITE) tablet 1 mg  1 mg Oral Daily Arta Silence, MD   1 mg at 03/27/22 1051   memantine (NAMENDA) tablet 10 mg  10 mg Oral BID Arta Silence, MD   10 mg at 03/27/22 1051   mesalamine (LIALDA) EC tablet 2.4 g  2.4 g Oral Daily Arta Silence, MD   2.4 g at 03/27/22 1050   [START ON 04/01/2022] methotrexate (RHEUMATREX) tablet 12.5 mg  12.5 mg Oral Weekly Arta Silence, MD       ondansetron [facility]) tablet 4 mg  4 mg Oral Q8H PRN Arta Silence, MD   4 mg at 03/27/22 1511   [START ON 03/28/2022] venlafaxine XR (EFFEXOR-XR) 24 hr capsule 75 mg  75 mg Oral Q breakfast Vira Blanco, [facility]       Current Outpatient Medications  Medication Sig Dispense Refill   acetaminophen (TYLENOL) 650 MG CR tablet Take 650 mg by mouth every 8 (eight) hours as needed for pain.     buPROPion (WELLBUTRIN XL) 150 MG 24 hr tablet Take 1 tablet (150 mg total) by mouth daily. Total of 450 mg daily 30 tablet 2   buPROPion (WELLBUTRIN XL) 300 MG 24 hr tablet Take 300 mg by mouth daily. Take in addition to one 150 mg tablet for total 450 mg once daily     Calcium Carb-Cholecalciferol (CALCIUM 600+D3 PO) Take 600 mg by mouth daily in the afternoon.     cyanocobalamin (VITAMIN B12) 1000 MCG tablet Take 1,000 mcg by mouth daily.     desvenlafaxine (PRISTIQ) 50 MG 24 hr tablet Take 50 mg by mouth daily.     dicyclomine (BENTYL) 20 MG tablet Take 20 mg by mouth 4 (four) times daily.     fludrocortisone (FLORINEF) 0.1 MG tablet Take 0.1 mg by mouth daily.     fluticasone (FLONASE) 50 MCG/ACT nasal spray 1 SPRAY INTO BOTH NOSTRILS EVERY DAY AS NEEDED FOR ALLERGIES (Patient taking differently: Place 1 spray into both nostrils daily as needed for allergies.) 16 g 5    fluticasone furoate-vilanterol (BREO ELLIPTA) 100-25 MCG/ACT AEPB INHALE 1 PUFF INTO THE LUNGS DAILY AS DIRECTED (Patient taking differently: Inhale 1 puff into the lungs daily as needed.) 60 each 2   folic acid (FOLVITE) 1 MG tablet Take 1 mg by mouth daily.     inFLIXimab (REMICADE) 100 MG injection Inject into the vein.     memantine (NAMENDA) 10 MG tablet Take 10 mg by mouth 2 (two) times daily.     mesalamine (LIALDA) 1.2 g EC tablet Take 2.4 g by mouth daily.     methotrexate (RHEUMATREX) 2.5 MG tablet Take 12.5 mg by mouth once a week.     ondansetron (ZOFRAN) 4 MG tablet Take 4 mg by mouth every 8 (eight) hours as needed  for nausea.     potassium chloride SA (KLOR-CON M) 20 MEQ tablet Take 20 mEq by mouth 2 (two) times daily.     acetaminophen (TYLENOL) 325 MG tablet Take 2 tablets (650 mg total) by mouth every 6 (six) hours as needed for mild pain (or Fever >/= 101). (Patient not taking: Reported on 03/26/2022)       Discharge Medications: Please see discharge summary for a list of discharge medications.  Relevant Imaging Results:  Relevant Lab Results:   Additional Information SSN [id number]  Ross Ludwig, LCSW

## 2022-03-27 NOTE — ED Notes (Signed)
Pt reported to this tech that felt disoriented and was trying to find daughter. She asked if her daughter was outside her room and I told the pt no. She said she didn't know why she thought that and that she must have been having a dream. She also expressed that she was nauseous and needed something for it.  This tech took pt to the bathroom and is now back in bed with the door cracked.

## 2022-03-27 NOTE — NC FL2 (Signed)
[place] LEVEL OF CARE FORM     IDENTIFICATION  Patient Name: Stephanie Patel Birthdate: 1945-10-21 Sex: female Admission Date (Current Location): 08/11/2021  [facility] and [place] Number:  Engineering geologist and Address:  [facility], [address], [place], [place] [address]      Provider Number: [id number]  Attending Physician Name and Address:  No att. providers found  Relative Name and Phone Number:  Lelan Pons  [phone]    Current Level of Care: Hospital Recommended Level of Care: [place] Prior Approval Number:    Date Approved/Denied:   PASRR Number: [id number]  Discharge Plan: SNF    Current Diagnoses: Patient Active Problem List   Diagnosis Date Noted   UTI (urinary tract infection) 08/15/2021   Irregular heart rhythm 08/13/2021   Closed fracture of multiple pubic rami, right, initial encounter ([place]) 08/11/2021   Laceration of forehead 08/11/2021   Accidental fall 08/11/2021   Pelvic fracture ([place]) 08/11/2021   GIB (gastrointestinal bleeding) 09/19/2019   GERD (gastroesophageal reflux disease) 09/19/2019   Hypokalemia 09/19/2019   CAP (community acquired pneumonia) 11/08/2016   Right inguinal hernia 09/29/2016   HTN, goal below 140/80 12/26/2015   ILD (interstitial lung disease) ([place]) 03/28/2015   Acute respiratory failure with hypoxia ([place]) 03/18/2015   Closed torus fracture of distal end of right fibula 10/03/2013   Depression 08/21/2013   Environmental allergies 08/21/2013   IBS (irritable bowel syndrome) 08/21/2013   LVH (left ventricular hypertrophy) 08/21/2013   Osteoarthritis of knee 08/21/2013   Vitamin D deficiency, unspecified 08/21/2013   Rheumatoid arthritis of multiple sites with negative rheumatoid factor ([place]) 02/10/2012    Orientation RESPIRATION BLADDER Height & Weight     Self, Time, Situation, Place  Normal Continent, External catheter Weight: 122 lb (55.3 kg) Height:   5' (152.4 cm)  BEHAVIORAL SYMPTOMS/MOOD NEUROLOGICAL BOWEL NUTRITION STATUS      Continent Diet (see dc summary)  AMBULATORY STATUS COMMUNICATION OF NEEDS Skin   Extensive Assist   Normal, Skin abrasions (forehead laceration)                       Personal Care Assistance Level of Assistance  Bathing, Feeding, Dressing Bathing Assistance: Limited assistance Feeding assistance: Independent Dressing Assistance: Maximum assistance     Functional Limitations Info             SPECIAL CARE FACTORS FREQUENCY  PT (By licensed PT), OT (By licensed OT)     PT Frequency: 5 times per week OT Frequency: 5 times per week            Contractures Contractures Info: Not present    Additional Factors Info  Code Status, Allergies Code Status Info: DNR Allergies Info: Keflex (Cephalexin), Peanut-containing Drug Products, Aricept (Donepezil), Codeine, Hydrochlorothiazide           Current Medications (03/27/2022):  This is the current hospital active medication list No current facility-administered medications for this encounter.   Current Outpatient Medications  Medication Sig Dispense Refill   Calcium Carb-Cholecalciferol (CALCIUM 600+D3 PO) Take 600 mg by mouth daily in the afternoon.     dicyclomine (BENTYL) 20 MG tablet Take 20 mg by mouth 4 (four) times daily.     folic acid (FOLVITE) 1 MG tablet Take 1 mg by mouth daily.     mesalamine (LIALDA) 1.2 g EC tablet Take 2.4 g by mouth daily.     methotrexate (RHEUMATREX) 2.5 MG tablet  Take 12.5 mg by mouth once a week.     acetaminophen (TYLENOL) 325 MG tablet Take 2 tablets (650 mg total) by mouth every 6 (six) hours as needed for mild pain (or Fever >/= 101). (Patient not taking: Reported on 03/26/2022)     acetaminophen (TYLENOL) 650 MG CR tablet Take 650 mg by mouth every 8 (eight) hours as needed for pain.     buPROPion (WELLBUTRIN XL) 150 MG 24 hr tablet Take 1 tablet (150 mg total) by mouth daily. Total of 450 mg daily  30 tablet 2   buPROPion (WELLBUTRIN XL) 300 MG 24 hr tablet Take 300 mg by mouth daily. Take in addition to one 150 mg tablet for total 450 mg once daily     cyanocobalamin (VITAMIN B12) 1000 MCG tablet Take 1,000 mcg by mouth daily.     desvenlafaxine (PRISTIQ) 50 MG 24 hr tablet Take 50 mg by mouth daily.     fludrocortisone (FLORINEF) 0.1 MG tablet Take 0.1 mg by mouth daily.     fluticasone (FLONASE) 50 MCG/ACT nasal spray 1 SPRAY INTO BOTH NOSTRILS EVERY DAY AS NEEDED FOR ALLERGIES (Patient taking differently: Place 1 spray into both nostrils daily as needed for allergies.) 16 g 5   fluticasone furoate-vilanterol (BREO ELLIPTA) 100-25 MCG/ACT AEPB INHALE 1 PUFF INTO THE LUNGS DAILY AS DIRECTED (Patient taking differently: Inhale 1 puff into the lungs daily as needed.) 60 each 2   inFLIXimab (REMICADE) 100 MG injection Inject into the vein.     memantine (NAMENDA) 10 MG tablet Take 10 mg by mouth 2 (two) times daily.     ondansetron (ZOFRAN) 4 MG tablet Take 4 mg by mouth every 8 (eight) hours as needed for nausea.     potassium chloride SA (KLOR-CON M) 20 MEQ tablet Take 20 mEq by mouth 2 (two) times daily.     Facility-Administered Medications Ordered in Other Encounters  Medication Dose Route Frequency Provider Last Rate Last Admin   acetaminophen (TYLENOL) tablet 650 mg  650 mg Oral Q6H PRN Arta Silence, MD       buPROPion (WELLBUTRIN XL) 24 hr tablet 150 mg  150 mg Oral Daily Arta Silence, MD   150 mg at 03/27/22 1049   buPROPion (WELLBUTRIN XL) 24 hr tablet 300 mg  300 mg Oral Daily Arta Silence, MD   300 mg at 03/27/22 1055   cyanocobalamin (VITAMIN B12) tablet 1,000 mcg  1,000 mcg Oral Daily Arta Silence, MD   1,000 mcg at 03/27/22 1051   fludrocortisone (FLORINEF) tablet 0.1 mg  0.1 mg Oral Daily Arta Silence, MD   0.1 mg at 03/27/22 1050   fluticasone (FLONASE) 50 MCG/ACT nasal spray 1 spray  1 spray Each Nare Daily PRN Arta Silence, MD        fluticasone furoate-vilanterol (BREO ELLIPTA) 100-25 MCG/ACT 1 puff  1 puff Inhalation Daily PRN Arta Silence, MD       folic acid (FOLVITE) tablet 1 mg  1 mg Oral Daily Arta Silence, MD   1 mg at 03/27/22 1051   memantine (NAMENDA) tablet 10 mg  10 mg Oral BID Arta Silence, MD   10 mg at 03/27/22 1051   mesalamine (LIALDA) EC tablet 2.4 g  2.4 g Oral Daily Arta Silence, MD   2.4 g at 03/27/22 1050   [START ON 04/01/2022] methotrexate (RHEUMATREX) tablet 12.5 mg  12.5 mg Oral Weekly Arta Silence, MD       ondansetron [facility]) tablet 4 mg  4  mg Oral Q8H PRN Arta Silence, MD   4 mg at 03/27/22 1511   [START ON 03/28/2022] venlafaxine XR (EFFEXOR-XR) 24 hr capsule 75 mg  75 mg Oral Q breakfast Vira Blanco, [facility]         Discharge Medications: Please see discharge summary for a list of discharge medications.  Relevant Imaging Results:  Relevant Lab Results:   Additional Information SS3 [id number]  Ross Ludwig, LCSW

## 2022-03-27 NOTE — ED Notes (Signed)
Pt assisted to the restroom; no other concerns at this time.

## 2022-03-27 NOTE — ED Notes (Signed)
Pt assisted to toile and back to bed. Pt reoriented to call bell and surroundings.

## 2022-03-27 NOTE — Evaluation (Signed)
Occupational Therapy Evaluation Patient Details Name: Stephanie Patel MRN: [id number] DOB: [birth year] Today's Date: 03/27/2022   History of Present Illness Pt is a 77 y/o F admitted on 03/26/22 after presenting to the ED with c/o generalized weakness & a fall at home. Abdominal US revealed cholelithiasis. PMH: depression, dysarthria, heart murmur, HTN, IBS, ILD, RA,   Clinical Impression   Patient presenting with decreased Ind in self care,balance, functional mobility/transfers, endurance, and safety awareness. Patient reports being Ind without use of AD and living at home alone. She has had multiple falls in the last 6 months. She endorses being able to performs self care tasks independently but has someone assist with deep cleaning at home. Pt needing mod A for functional mobility and self care needs during session. Min cuing needed for safety awareness as well. Daughter, Neoma Laming, present during session and reports pt has no support in the area as family lives out of town.  Patient will benefit from acute OT to increase overall independence in the areas of ADLs, functional mobility, and safety awareness in order to safely discharge to next venue of care.      Recommendations for follow up therapy are one component of a multi-disciplinary discharge planning process, led by the attending physician.  Recommendations may be updated based on patient status, additional functional criteria and insurance authorization.   Follow Up Recommendations  Skilled nursing-short term rehab (<3 hours/day)     Assistance Recommended at Discharge Frequent or constant Supervision/Assistance  Patient can return home with the following A lot of help with walking and/or transfers;A lot of help with bathing/dressing/bathroom;Assistance with cooking/housework;Assist for transportation;Help with stairs or ramp for entrance    Functional Status Assessment  Patient has had a recent decline in their functional status and  demonstrates the ability to make significant improvements in function in a reasonable and predictable amount of time.  Equipment Recommendations  Other (comment) (defer to next venue of care)       Precautions / Restrictions Precautions Precautions: Fall      Mobility Bed Mobility Overal bed mobility: Needs Assistance Bed Mobility: Supine to Sit, Sit to Supine     Supine to sit: Mod assist Sit to supine: Mod assist   General bed mobility comments: hospital stretcher    Transfers Overall transfer level: Needs assistance Equipment used: 1 person hand held assist Transfers: Sit to/from Stand Sit to Stand: Mod assist                  Balance Overall balance assessment: Needs assistance, History of Falls Sitting-balance support: Feet supported, Bilateral upper extremity supported Sitting balance-Leahy Scale: Fair     Standing balance support: During functional activity, Single extremity supported Standing balance-Leahy Scale: Poor                             ADL either performed or assessed with clinical judgement   ADL Overall ADL's : Needs assistance/impaired                         Toilet Transfer: Moderate assistance;Ambulation   Toileting- Clothing Manipulation and Hygiene: Moderate assistance;Sit to/from stand               Vision Patient Visual Report: No change from baseline              Pertinent Vitals/Pain Pain Assessment Pain Assessment: No/denies pain     Hand  Dominance Right   Extremity/Trunk Assessment Upper Extremity Assessment Upper Extremity Assessment: Generalized weakness   Lower Extremity Assessment Lower Extremity Assessment: Generalized weakness       Communication Communication Communication: No difficulties   Cognition Arousal/Alertness: Awake/alert Behavior During Therapy: WFL for tasks assessed/performed Overall Cognitive Status: History of cognitive impairments - at baseline                                  General Comments: Per daughter pt has a hx of dementia, pt required extra time to recall current year & month without assistance but self corrected several times. Oriented to self, location & situation.                Home Living Family/patient expects to be discharged to:: Private residence Living Arrangements: Alone Available Help at Discharge: Friend(s);Available PRN/intermittently Type of Home: Other(Comment) (condo) Home Access: Level entry     Home Layout: Two level;Able to live on main level with bedroom/bathroom     Bathroom Shower/Tub: Occupational psychologist: Handicapped height Bathroom Accessibility: Yes   Home Equipment: Conservation officer, nature (2 wheels);Shower seat;BSC/3in1;Shower seat - built in          Prior Functioning/Environment Prior Level of Function : Independent/Modified Independent             Mobility Comments: Pt reports she has a RW that she started using  but daughter denies this. Pt lives alone, still driving, but has had 6 falls in the past 6 months, another fall this past fall resulting in pt going to STR. ADLs Comments: Pt reports she bathes & dresses herself, cookes simple meals, has someone clean for her. Reports pharmacy fills her pill box for her.        OT Problem List: Decreased strength;Decreased activity tolerance;Decreased safety awareness;Impaired balance (sitting and/or standing);Decreased knowledge of use of DME or AE      OT Treatment/Interventions: Self-care/ADL training;Therapeutic exercise;Therapeutic activities;Energy conservation;DME and/or AE instruction;Patient/family education;Balance training    OT Goals(Current goals can be found in the care plan section) Acute Rehab OT Goals Patient Stated Goal: to return home OT Goal Formulation: With patient Time For Goal Achievement: 04/10/22 Potential to Achieve Goals: Fair ADL Goals Pt Will Perform Grooming: with min guard  assist;standing Pt Will Perform Lower Body Dressing: with min assist Pt Will Transfer to Toilet: with min assist Pt Will Perform Toileting - Clothing Manipulation and hygiene: with min assist  OT Frequency: Min 2X/week       AM-PAC OT "6 Clicks" Daily Activity     Outcome Measure Help from another person eating meals?: None Help from another person taking care of personal grooming?: A Little Help from another person toileting, which includes using toliet, bedpan, or urinal?: A Lot Help from another person bathing (including washing, rinsing, drying)?: A Lot Help from another person to put on and taking off regular upper body clothing?: A Little Help from another person to put on and taking off regular lower body clothing?: A Lot 6 Click Score: 16   End of Session Nurse Communication: Mobility status  Activity Tolerance: Patient limited by fatigue Patient left: in bed;with call bell/phone within reach;with family/visitor present  OT Visit Diagnosis: Unsteadiness on feet (R26.81);Repeated falls (R29.6);Muscle weakness (generalized) (M62.81)                Time: [id number] OT Time Calculation (min): 22 min Charges:  OT General  Charges $OT Visit: 1 Visit OT Evaluation $OT Eval Moderate Complexity: 1 Mod OT Treatments $Self Care/Home Management : 8-22 mins  Darleen Crocker, MS, OTR/L , CBIS ascom [phone]  03/27/22, 12:38 PM

## 2022-03-27 NOTE — TOC Initial Note (Signed)
Transition of Care [facility]) - Initial/Assessment Note    Patient Details  Name: Stephanie Patel MRN: [id number] Date of Birth: 01-16-1945  Transition of Care [facility]) CM/SW Contact:    Ross Ludwig, LCSW Phone Number: 03/27/2022, 8:41 PM  Clinical Narrative:                  Patient is a 77 year old female who lives alone.  Patient came to the emergency room due to having falls and some confusion.  CSW spoke to patient's daughter Neoma Laming who stated patient was at [facility] in the past and she would like her to return for some rehab.  CSW explained that insurance will have to approve patient to go to SNF in order for her be accepted, and [facility] will have to have a bed available.  Patient's daughter expressed understanding, CSW was given permission to begin bed search in [place].  CSW contacted Seth Bake at [facility], she review patient and can accept patient if insurance authorization is received before 11am on Saturday, and discharge summary is received by 11:30am.  CSW started insurance authorization, reference number [id number], per Bernadene Bell, it is still pending.  CSW explained to patient's daughter that if patient is denied, she may have to go home with home health.  CSW explained daughter and patient do have right to appeal insurance company if she is denied.  CSW has started insurance authorization and waiting for approval.  Bedside nurse and attending physician made aware.  TOC to continue to follow patient's progress throughout discharge planning.  Expected Discharge Plan: Skilled Nursing Facility Barriers to Discharge: Insurance Authorization   Patient Goals and CMS Choice Patient states their goals for this hospitalization and ongoing recovery are:: To return back home after rehab. CMS Medicare.gov Compare Post Acute Care list provided to:: Patient Represenative (must comment) Choice offered to / list presented to : Patient, Adult [place] ownership interest  in [facility].provided to:: Adult Children    Expected Discharge Plan and Services     Post Acute Care Choice: [place] arrangements for the past 2 months: Single Family Home                                      Prior Living Arrangements/Services Living arrangements for the past 2 months: Single Family Home Lives with:: Self Patient language and need for interpreter reviewed:: Yes Do you feel safe going back to the place where you live?: No   Patient needs rehab before returning back home.  Need for Family Participation in Patient Care: Yes (Comment) Care giver support system in place?: No (comment)   Criminal Activity/Legal Involvement Pertinent to Current Situation/Hospitalization: No - Comment as needed  Activities of Daily Living      Permission Sought/Granted Permission sought to share information with : Case Manager, Family Supports, Customer service manager Permission granted to share information with : Yes, Verbal Permission Granted  Share Information with NAME: Leath,John Brother [place] Daughter   [phone]  Permission granted to share info w AGENCY: SNF admissions        Emotional Assessment Appearance:: Appears stated age   Affect (typically observed): Accepting, Appropriate, Calm Orientation: : Oriented to Self, Oriented to Place, Oriented to  Time Alcohol / Substance Use: Not Applicable Psych Involvement: No (comment)  Admission diagnosis:  Weakness Patient Active Problem  List   Diagnosis Date Noted   UTI (urinary tract infection) 08/15/2021   Irregular heart rhythm 08/13/2021   Closed fracture of multiple pubic rami, right, initial encounter ([place]) 08/11/2021   Laceration of forehead 08/11/2021   Accidental fall 08/11/2021   Pelvic fracture ([place]) 08/11/2021   GIB (gastrointestinal bleeding) 09/19/2019   GERD (gastroesophageal reflux disease) 09/19/2019   Hypokalemia 09/19/2019    CAP (community acquired pneumonia) 11/08/2016   Right inguinal hernia 09/29/2016   HTN, goal below 140/80 12/26/2015   ILD (interstitial lung disease) ([place]) 03/28/2015   Acute respiratory failure with hypoxia ([place]) 03/18/2015   Closed torus fracture of distal end of right fibula 10/03/2013   Depression 08/21/2013   Environmental allergies 08/21/2013   IBS (irritable bowel syndrome) 08/21/2013   LVH (left ventricular hypertrophy) 08/21/2013   Osteoarthritis of knee 08/21/2013   Vitamin D deficiency, unspecified 08/21/2013   Rheumatoid arthritis of multiple sites with negative rheumatoid factor ([place]) 02/10/2012   PCP:  Idelle Crouch, MD Pharmacy:   [place], [place] - [address] [place] [address] Phone: [phone] Fax: [phone]     Social Determinants of Health (SDOH) Social History: SDOH Screenings   Depression (PHQ2-9): Medium Risk (02/19/2022)  Tobacco Use: Medium Risk (03/26/2022)   SDOH Interventions:     Readmission Risk Interventions    08/15/2021    9:53 AM  Readmission Risk Prevention Plan  Transportation Screening Complete  PCP or Specialist Appt within 3-5 Days Complete  HRI or [place] Complete  Social Work Consult for [place] Planning/Counseling Complete  Palliative Care Screening Not Applicable  Medication Review Press photographer) Complete

## 2022-03-27 NOTE — ED Notes (Signed)
Pt assisted from commode to bed, new brief applied, pt given drink by request. Daughter at bedside.

## 2022-03-27 NOTE — ED Notes (Signed)
Pt moved to hospital bed at this time. PT clean, dry, hospital gown and socks present, daughter is at bedside.

## 2022-03-28 DIAGNOSIS — R42 Dizziness and giddiness: Secondary | ICD-10-CM | POA: Diagnosis not present

## 2022-03-28 NOTE — ED Notes (Signed)
Assisted pt to the bedside commode. Slow steady gait. Pt back in bed. Food tray within reach. Pt denies further needs at this time.

## 2022-03-28 NOTE — TOC Progression Note (Signed)
Transition of Care [facility]) - Progression Note    Patient Details  Name: Stephanie Patel MRN: [id number] Date of Birth: February 10, 1945  Transition [facility] Care [facility]) CM/SW [place], [place] Phone Number: 03/28/2022, 1:20 PM  Clinical Narrative:     Insurance authorization for [facility] SNF still pending at this time, per [employer].   Expected Discharge Plan: Skilled Nursing Facility Barriers to Discharge: Insurance Authorization  Expected Discharge Plan and [place] Choice: [place] Living arrangements for the past 2 months: Single Family Home                                       Social Determinants of Health (SDOH) Interventions SDOH Screenings   Depression (PHQ2-9): Medium Risk (02/19/2022)  Tobacco Use: Medium Risk (03/26/2022)    Readmission Risk Interventions    08/15/2021    9:53 AM  Readmission Risk Prevention Plan  Transportation Screening Complete  PCP or Specialist Appt within 3-5 Days Complete  HRI or [place] Complete  Social Work Consult for [place] Planning/Counseling Complete  Palliative Care Screening Not Applicable  Medication Review Press photographer) Complete

## 2022-03-28 NOTE — ED Notes (Signed)
Family at bedside. 

## 2022-03-28 NOTE — ED Notes (Signed)
Report given to Meghan RN

## 2022-03-28 NOTE — ED Notes (Signed)
Assisted Pt to the sink to brush teeth. Pt used walker. Stand by assist. Pt back to bed. Denies further needs at this time.

## 2022-03-28 NOTE — ED Notes (Signed)
Pt reports improvement with nausea.  

## 2022-03-28 NOTE — ED Provider Notes (Signed)
-----------------------------------------   6:38 AM on 03/28/2022 -----------------------------------------   Blood pressure 126/73, pulse 64, temperature 97.6 F (36.4 C), resp. rate 17, height 1.524 m (5'), weight 51.3 kg, SpO2 96 %.  The patient is calm and cooperative at this time.  There have been no acute events since the last update.  Awaiting disposition plan from [facility] team.   Hinda Kehr, MD 03/28/22 [phone]

## 2022-03-28 NOTE — ED Notes (Signed)
Assisted pt to the bedside commode. Pt steady gait with one assist. Pt states that she uses a walker at home. Walker placed in room.

## 2022-03-29 DIAGNOSIS — R42 Dizziness and giddiness: Secondary | ICD-10-CM | POA: Diagnosis not present

## 2022-03-29 NOTE — ED Notes (Signed)
Patient care taken over. Patient sleeping in bed with side rails up and call light within reach. Patient breathing easy, regular and unlabored.

## 2022-03-29 NOTE — ED Notes (Signed)
Pt found trying to get out of bed.  Her gait is very weak and unsteady, assisted by RN.  Pt back to bed and RN assisted pt in brushing her teeth.  Bed alarm on.

## 2022-03-29 NOTE — TOC Progression Note (Addendum)
Transition of Care [facility]) - Progression Note    Patient Details  Name: SANTIAGO LINEN MRN: [id number] Date of Birth: 01/09/1945  Transition of Care [facility]) CM/SW [place], LCSW Phone Number: 03/29/2022, 9:08 AM  Clinical Narrative:  Insurance authorization still pending.   1:42 pm: Auth still pending.  Expected Discharge Plan: Skilled Nursing Facility Barriers to Discharge: Insurance Authorization  Expected Discharge Plan and [place] Choice: [place] Living arrangements for the past 2 months: Single Family Home                                       Social Determinants of Health (SDOH) Interventions SDOH Screenings   Depression (PHQ2-9): Medium Risk (02/19/2022)  Tobacco Use: Medium Risk (03/26/2022)    Readmission Risk Interventions    08/15/2021    9:53 AM  Readmission Risk Prevention Plan  Transportation Screening Complete  PCP or Specialist Appt within 3-5 Days Complete  HRI or [place] Complete  Social Work Consult for [place] Planning/Counseling Complete  Palliative Care Screening Not Applicable  Medication Review Press photographer) Complete

## 2022-03-29 NOTE — ED Notes (Signed)
Patient given breakfast tray. Patient denies needs at this time.

## 2022-03-29 NOTE — ED Provider Notes (Signed)
Emergency Medicine Observation Re-evaluation Note  Physical Exam   BP 128/87   Pulse 67   Temp 98.3 F (36.8 C) (Oral)   Resp 14   Ht 5' (1.524 m)   Wt 51.3 kg   SpO2 97%   BMI 22.07 kg/m   Patient appears in no acute distress.  ED Course / MDM   No reported events during my shift at the time of this note.   Pt is awaiting dispo from SW   Lucillie Garfinkel MD    Lucillie Garfinkel, MD 03/29/22 1540

## 2022-03-29 NOTE — ED Notes (Signed)
Patient medicated and needs met. Patient is sitting up on the side of the bed eating with no difficulty. Patient has call light within reach.

## 2022-03-29 NOTE — ED Notes (Signed)
Assumed care of pt, found her alert and oriented in bed.  She denies any pain or needs at this time.  Her phone rang so RN will return to finish assessment.

## 2022-03-29 NOTE — ED Notes (Signed)
Family present in the ER. Patient sleeping soundly and family did not wish to wake her. Family updated on plan of care. Family denies questions or concerns at this time. Family reported to RN that she would return.

## 2022-03-29 NOTE — ED Notes (Signed)
Pt called out asking for her walker which was brounght to her.  RN assisted pt to the bathroom.  Pt comfortable in bed.

## 2022-03-30 DIAGNOSIS — R42 Dizziness and giddiness: Secondary | ICD-10-CM | POA: Diagnosis not present

## 2022-03-30 NOTE — ED Notes (Signed)
Assisted pt to the bathroom, no issues at this time.  Pt requested that RN remove IV which did appear red.  RN did remove IV.  Pt feels that the tape was making the site "angry."  RN used coban instead of tape.  PT very pleased.

## 2022-03-30 NOTE — Progress Notes (Signed)
Physical Therapy Treatment Patient Details Name: Stephanie Patel MRN: [id number] DOB: Jun 12, 1945 Today's Date: 03/30/2022   History of Present Illness Pt is a 77 y/o F admitted on 03/26/22 after presenting to the ED with c/o generalized weakness & a fall at home. Abdominal US revealed cholelithiasis. PMH: depression, dysarthria, heart murmur, HTN, IBS, ILD, RA,    PT Comments    Pt in bed ready for session.  OOB to left with min guard.  Stands and is able to walk 100' with RW and min guard/assist to manage walker at times.  Overall good improvement with mobility today.  Opts to remain sitting EOB to eat breakfast.  Alarm on, call bell in reach and RN aware of her choice to sit.  She demonstrated good sitting balance with no LOB's during set up and stated she has been sitting to eat this week-end and can get her legs up on her own.     Recommendations for follow up therapy are one component of a multi-disciplinary discharge planning process, led by the attending physician.  Recommendations may be updated based on patient status, additional functional criteria and insurance authorization.  Follow Up Recommendations       Assistance Recommended at Discharge Frequent or constant Supervision/Assistance  Patient can return home with the following Assist for transportation;Help with stairs or ramp for entrance;Direct supervision/assist for medications management;Direct supervision/assist for financial management;Assistance with cooking/housework;A little help with walking and/or transfers;A little help with bathing/dressing/bathroom   Equipment Recommendations  None recommended by PT    Recommendations for Other Services OT consult     Precautions / Restrictions Precautions Precautions: Fall Restrictions Weight Bearing Restrictions: No     Mobility  Bed Mobility Overal bed mobility: Needs Assistance Bed Mobility: Supine to Sit     Supine to sit: Min assist           Transfers Overall transfer level: Needs assistance Equipment used: Rolling walker (2 wheels) Transfers: Sit to/from Stand Sit to Stand: Min guard, Min assist                Ambulation/Gait Ambulation/Gait assistance: Herbalist (Feet): 100 Feet Assistive device: Rolling walker (2 wheels) Gait Pattern/deviations: Step-through pattern, Decreased step length - right, Decreased step length - left Gait velocity: decreased         Stairs             Wheelchair Mobility    Modified Rankin (Stroke Patients Only)       Balance Overall balance assessment: Needs assistance, History of Falls Sitting-balance support: Feet supported, Bilateral upper extremity supported Sitting balance-Leahy Scale: Good     Standing balance support: Bilateral upper extremity supported Standing balance-Leahy Scale: Fair Standing balance comment: +1 assist for safety but no LOB noted                            Cognition Arousal/Alertness: Awake/alert Behavior During Therapy: WFL for tasks assessed/performed Overall Cognitive Status: History of cognitive impairments - at baseline                                          Exercises      General Comments        Pertinent Vitals/Pain Pain Assessment Pain Assessment: No/denies pain    Home Living  Prior Function            PT Goals (current goals can now be found in the care plan section) Progress towards PT goals: Progressing toward goals    Frequency    Min 2X/week      PT Plan      Co-evaluation              AM-PAC PT "6 Clicks" Mobility   Outcome Measure  Help needed turning from your back to your side while in a flat bed without using bedrails?: A Little Help needed moving from lying on your back to sitting on the side of a flat bed without using bedrails?: A Little Help needed moving to and from a bed to a chair  (including a wheelchair)?: A Little Help needed standing up from a chair using your arms (e.g., wheelchair or bedside chair)?: A Little Help needed to walk in hospital room?: A Little Help needed climbing 3-5 steps with a railing? : A Lot 6 Click Score: 17    End of Session   Activity Tolerance: Patient tolerated treatment well Patient left: in bed;with call bell/phone within reach   PT Visit Diagnosis: Muscle weakness (generalized) (M62.81);History of falling (Z91.81);Other abnormalities of gait and mobility (R26.89);Difficulty in walking, not elsewhere classified (R26.2);Unsteadiness on feet (R26.81)     Time: [id number] PT Time Calculation (min) (ACUTE ONLY): 10 min  Charges:  $Gait Training: 8-22 mins                   Chesley Noon, PTA 03/30/22, 9:12 AM

## 2022-03-30 NOTE — TOC Transition Note (Signed)
Transition of Care [facility]) - CM/SW Discharge Note   Patient Details  Name: SADIE BOBIAN MRN: [id number] Date of Birth: 11/27/1945  Transition of Care [facility]) CM/SW Contact:  Tiburcio Bash, LCSW Phone Number: 03/30/2022, 4:04 PM   Clinical Narrative:     Patient to discharge to [place], RN aware number for report is [phone] Room 105, patient to dc via [facility]  Final next level of care: Skilled Nursing Facility Barriers to Discharge: No Barriers Identified   Patient Goals and CMS Choice CMS Medicare.gov Compare Post Acute Care list provided to:: Patient Represenative (must comment) Choice offered to / list presented to : Patient, Adult Children  Discharge Placement                         Discharge Plan and Services Additional resources added to the After Visit Summary for       Post Acute Care Choice: [place]                               Social Determinants of Health (SDOH) Interventions SDOH Screenings   Depression (PHQ2-9): Medium Risk (02/19/2022)  Tobacco Use: Medium Risk (03/26/2022)     Readmission Risk Interventions    08/15/2021    9:53 AM  Readmission Risk Prevention Plan  Transportation Screening Complete  PCP or Specialist Appt within 3-5 Days Complete  HRI or [place] Complete  Social Work Consult for [place] Planning/Counseling Complete  Palliative Care Screening Not Applicable  Medication Review Press photographer) Complete

## 2022-03-30 NOTE — ED Notes (Signed)
[facility]  [facility]  FAMILY  WILL TRANSPORT

## 2022-03-30 NOTE — ED Notes (Signed)
[facility]  CALLED  FOR  TRANSPORT  TO  [facility] 

## 2022-03-30 NOTE — ED Notes (Signed)
Assisted pt to the [facility] w/ pt using her walker.  She continues to have an unsteady gait.

## 2022-03-30 NOTE — ED Provider Notes (Signed)
Emergency department handoff note  Care of this patient was signed out to me at the end of the previous provider shift.  All pertinent patient information was conveyed and all questions were answered.  Patient pending transport to skilled nursing facility today.   Naaman Plummer, MD 03/30/22 [phone]

## 2022-03-30 NOTE — ED Provider Notes (Signed)
-----------------------------------------   4:16 AM on 03/30/2022 -----------------------------------------   Blood pressure 131/75, pulse 65, temperature 97.6 F (36.4 C), resp. rate 14, height 5' (1.524 m), weight 51.3 kg, SpO2 97 %.  The patient is calm and cooperative at this time.  There have been no acute events since the last update.  Awaiting disposition plan from case management/social work.    Solomiya Pascale, Delice Bison, DO 03/30/22 [phone]

## 2022-03-30 NOTE — TOC Progression Note (Signed)
Transition of Care [facility]) - Progression Note    Patient Details  Name: Stephanie Patel MRN: [id number] Date of Birth: January 11, 1945  Transition of Care [facility]) CM/SW [place], [place] Phone Number: 03/30/2022, 11:40 AM  Clinical Narrative:     CSW received call from insurance stating that when initial referral was sent by toc supervisor 3/22 that no MD notes were uploaded to insurance. They report that once MD notes are uploaded they can make a decision to approve or deny insurance authorization for [facility].   CSW has uploaded notes into [employer] portal. Pending insurance auth at this time.    Expected Discharge Plan: Skilled Nursing Facility Barriers to Discharge: Insurance Authorization  Expected Discharge Plan and [place] Choice: [place] Living arrangements for the past 2 months: Single Family Home                                       Social Determinants of Health (SDOH) Interventions SDOH Screenings   Depression (PHQ2-9): Medium Risk (02/19/2022)  Tobacco Use: Medium Risk (03/26/2022)    Readmission Risk Interventions    08/15/2021    9:53 AM  Readmission Risk Prevention Plan  Transportation Screening Complete  PCP or Specialist Appt within 3-5 Days Complete  HRI or [place] Complete  Social Work Consult for [place] Planning/Counseling Complete  Palliative Care Screening Not Applicable  Medication Review Press photographer) Complete

## 2022-04-01 ENCOUNTER — Encounter: Payer: Self-pay | Admitting: Student

## 2022-04-01 ENCOUNTER — Non-Acute Institutional Stay (SKILLED_NURSING_FACILITY): Payer: Medicare PPO | Admitting: Student

## 2022-04-01 DIAGNOSIS — F331 Major depressive disorder, recurrent, moderate: Secondary | ICD-10-CM

## 2022-04-01 DIAGNOSIS — J42 Unspecified chronic bronchitis: Secondary | ICD-10-CM

## 2022-04-01 DIAGNOSIS — R4189 Other symptoms and signs involving cognitive functions and awareness: Secondary | ICD-10-CM

## 2022-04-01 DIAGNOSIS — M0609 Rheumatoid arthritis without rheumatoid factor, multiple sites: Secondary | ICD-10-CM | POA: Diagnosis not present

## 2022-04-01 DIAGNOSIS — R296 Repeated falls: Secondary | ICD-10-CM | POA: Diagnosis not present

## 2022-04-01 DIAGNOSIS — I77810 Thoracic aortic ectasia: Secondary | ICD-10-CM | POA: Diagnosis not present

## 2022-04-01 DIAGNOSIS — K219 Gastro-esophageal reflux disease without esophagitis: Secondary | ICD-10-CM
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
## 2022-04-02 ENCOUNTER — Emergency Department
Admission: EM | Admit: 2022-04-02 | Discharge: 2022-04-02 | Disposition: A | Discharge status: Skilled Nursing Facility | Payer: Medicare PPO | Attending: Emergency Medicine | Admitting: Emergency Medicine

## 2022-04-02 ENCOUNTER — Other Ambulatory Visit: Payer: Self-pay

## 2022-04-02 ENCOUNTER — Emergency Department: Payer: Medicare PPO

## 2022-04-02 DIAGNOSIS — J42 Unspecified chronic bronchitis: Secondary | ICD-10-CM | POA: Insufficient documentation

## 2022-04-02 DIAGNOSIS — S0001XA Abrasion of scalp, initial encounter: Secondary | ICD-10-CM | POA: Insufficient documentation

## 2022-04-02 DIAGNOSIS — R42 Dizziness and giddiness: Secondary | ICD-10-CM | POA: Insufficient documentation

## 2022-04-02 DIAGNOSIS — S0990XA Unspecified injury of head, initial encounter: Secondary | ICD-10-CM | POA: Diagnosis present

## 2022-04-02 DIAGNOSIS — W19XXXA Unspecified fall, initial encounter: Secondary | ICD-10-CM | POA: Diagnosis not present

## 2022-04-02 DIAGNOSIS — I77810 Thoracic aortic ectasia: Secondary | ICD-10-CM | POA: Insufficient documentation

## 2022-04-02 LAB — BASIC METABOLIC PANEL
Anion gap: 9 (ref 5–15)
BUN: 13 mg/dL (ref 8–23)
CO2: 28 mmol/L (ref 22–32)
Calcium: 9.3 mg/dL (ref 8.9–10.3)
Chloride: 98 mmol/L (ref 98–111)
Creatinine, Ser: 0.84 mg/dL (ref 0.44–1.00)
GFR, Estimated: >60 mL/min (ref >60)
Glucose, Bld: 112 mg/dL — ABNORMAL HIGH (ref 70–99)
Potassium: 4.1 mmol/L (ref 3.5–5.1)
Sodium: 135 mmol/L (ref 135–145)

## 2022-04-02 LAB — CBC
HCT: 40.6 % (ref 36.0–46.0)
Hemoglobin: 13.1 g/dL (ref 12.0–15.0)
MCH: 33.2 pg (ref 26.0–34.0)
MCHC: 32.3 g/dL (ref 30.0–36.0)
MCV: 103 fL — ABNORMAL HIGH (ref 80.0–100.0)
Platelets: 236 10*3/uL (ref 150–400)
RBC: 3.94 MIL/uL (ref 3.87–5.11)
RDW: 13.5 % (ref 11.5–15.5)
WBC: 6.8 10*3/uL (ref 4.0–10.5)
nRBC: 0 % (ref 0.0–0.2)

## 2022-04-02 LAB — CBG MONITORING, ED: Glucose-Capillary: 109 mg/dL — ABNORMAL HIGH (ref 70–99)

## 2022-04-02 NOTE — ED Provider Notes (Signed)
[facility] Provider Note    Event Date/Time   First MD Initiated Contact with Patient 04/02/22 1515     (approximate)   History   Fall   HPI  Stephanie Patel is a 77 y.o. female who states that she is here today for a fall.  She is unsure why she fell but stated she felt a little dizzy.  She did hit the back of her head.  While that initially because of pain she feels like the pain has improved.  The patient denies any other injuries.  Denies any recent illness.  No fevers.  No chest pain or palpitations. Per chart review the patient was discharged from the emergency department 3 days ago and had documented issues with falls and ambulation.     Physical Exam   Triage Vital Signs: ED Triage Vitals [04/02/22 1341]  Enc Vitals Group     BP 125/82     Pulse Rate 65     Resp 18     Temp 98.1 F (36.7 C)     Temp src      SpO2 99 %     Weight 103 lb 9.9 oz (47 kg)     Height 5' (1.524 m)     Head Circumference      Peak Flow      Pain Score 0     Pain Loc      Pain Edu?      Excl. in [place]?     Most recent vital signs: Vitals:   04/02/22 1341  BP: 125/82  Pulse: 65  Resp: 18  Temp: 98.1 F (36.7 C)  SpO2: 99%   General: Awake, alert, not completely oriented. CV:  Good peripheral perfusion. Regular rate and rhythm. Resp:  Normal effort. Lungs clear. Abd:  No distention. Non tender. Head:  Small abrasion to occiput.   ED Results / Procedures / Treatments   Labs (all labs ordered are listed, but only abnormal results are displayed) Labs Reviewed  CBC - Abnormal; Notable for the following components:      Result Value   MCV 103.0 (*)    All other components within normal limits  BASIC METABOLIC PANEL - Abnormal; Notable for the following components:   Glucose, Bld 112 (*)    All other components within normal limits     EKG  I, Nance Pear, attending physician, personally viewed and interpreted this EKG  EKG Time:  1349 Rate: 67 Rhythm: normal sinus rhythm Axis: rightward axis Intervals: qtc 429 QRS: LVH ST changes: no st elevation Impression: abnormal ekg    RADIOLOGY I independently interpreted and visualized the CT head/cervical spine. My interpretation: No intracranial bleed, no acute fracture Radiology interpretation:  IMPRESSION:  No acute intracranial or cervical spine findings.     PROCEDURES:  Critical Care performed: No  MEDICATIONS ORDERED IN ED: Medications - No data to display   IMPRESSION / MDM / [place] / ED COURSE  I reviewed the triage vital signs and the nursing notes.                              Differential diagnosis includes, but is not limited to, intracranial bleed, fracture, anemia, electrolyte abnormality.  Patient's presentation is most consistent with acute presentation with potential threat to life or bodily function.   Patient presented to the urgency department today after a fall and head injury.  On exam patient has a small abrasion to her occiput which does not require any advanced closure.  CT scan of the head and cervical spine without any acute intracranial bleed or fracture.  Blood work without any concerning anemia or electrolyte abnormalities or leukocytosis.  Daughter did present to the emergency department I discussed with daughter.  Given patient has history of fall she felt comfortable deferring UA at this time.  I think this is reasonable.  Patient had negative UA a week ago.  Will plan on discharging back to living facility.  FINAL CLINICAL IMPRESSION(S) / ED DIAGNOSES   Final diagnoses:  Fall, initial encounter  Abrasion of scalp, initial encounter     Note:  This document was prepared using Dragon voice recognition software and may include unintentional dictation errors.    Nance Pear, MD 04/02/22 1726

## 2022-04-02 NOTE — Discharge Instructions (Signed)
Please seek medical attention for any high fevers, chest pain, shortness of breath, change in behavior, persistent vomiting, bloody stool or any other new or concerning symptoms.  

## 2022-04-02 NOTE — ED Triage Notes (Signed)
Pt to ED [facility] from [facility] for multiple falls starting yesterday. Pt has hit head with falls, small laceration noted to back of head. Denies blood thinner use. Pt endorses feeling generalized weakness. EMS reports pt confused at baseline, pt can answer orientation questions appropriately in delayed manner. Pt in NAD. Denies pain.

## 2022-04-02 NOTE — ED Notes (Signed)
Pt s/p fall with small posterior head lac, bleeding controlled. Pt denies pain. Pt

## 2022-04-03 LAB — CBC AND DIFFERENTIAL
HCT: 40 (ref 36–46)
Hemoglobin: 13.5 (ref 12.0–16.0)
Neutrophils Absolute: 6278
Platelets: 266 10*3/uL (ref 150–400)
WBC: 9.3

## 2022-04-03 LAB — CBC: RBC: 4.05 (ref 3.87–5.11)

## 2022-04-21 ENCOUNTER — Ambulatory Visit: Payer: TRICARE For Life (TFL) | Admitting: Psychiatry

## 2022-04-21 ENCOUNTER — Telehealth: Payer: Self-pay | Admitting: Psychiatry

## 2022-04-21 NOTE — Telephone Encounter (Signed)
Patient called back regarding to reschedule appointment. Stated she needs to cancel. She is currently in assisted living at [facility] and is unsure when she can come. Daughters are discussing to move her to near where they live. She will call back.

## 2022-04-28 ENCOUNTER — Encounter: Payer: Self-pay | Admitting: Nurse Practitioner

## 2022-04-28 ENCOUNTER — Non-Acute Institutional Stay (SKILLED_NURSING_FACILITY): Payer: Medicare PPO | Admitting: Nurse Practitioner

## 2022-04-28 DIAGNOSIS — K219 Gastro-esophageal reflux disease without esophagitis: Secondary | ICD-10-CM | POA: Diagnosis not present

## 2022-04-28 DIAGNOSIS — F331 Major depressive disorder, recurrent, moderate: Secondary | ICD-10-CM

## 2022-04-28 DIAGNOSIS — M0609 Rheumatoid arthritis without rheumatoid factor, multiple sites: Secondary | ICD-10-CM | POA: Diagnosis not present

## 2022-04-28 DIAGNOSIS — J42 Unspecified chronic bronchitis: Secondary | ICD-10-CM

## 2022-04-28 DIAGNOSIS — R4189 Other symptoms and signs involving cognitive functions and awareness: Secondary | ICD-10-CM

## 2022-04-28 DIAGNOSIS — R296 Repeated falls: Secondary | ICD-10-CM

## 2022-04-28 DIAGNOSIS — R110 Nausea: Secondary | ICD-10-CM
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
## 2022-07-27 ENCOUNTER — Telehealth: Payer: Self-pay | Admitting: Psychiatry

## 2022-07-27 NOTE — Telephone Encounter (Signed)
Could you make this referral? thanks

## 2022-07-27 NOTE — Telephone Encounter (Signed)
Patient's daughter called and stated patient is in an assisted living home in [place]. Patient is requesting a referral be sent to Dr. Magda Kiel [phone] for further care. Patient saw Dr. Vanetta Shawl 02/19/2022 as a new patient.-Please advise

## 2022-07-29 NOTE — Telephone Encounter (Signed)
Referral sent
